# Patient Record
Sex: Female | Born: 1980 | Race: White | Hispanic: No | Smoking: Never smoker
Health system: Southern US, Community
[De-identification: ages and names within clinical notes are randomized; demographics above are authoritative.]

## PROBLEM LIST (undated history)

## (undated) DIAGNOSIS — E118 Type 2 diabetes mellitus with unspecified complications: Secondary | ICD-10-CM

## (undated) DIAGNOSIS — H269 Unspecified cataract: Secondary | ICD-10-CM

## (undated) DIAGNOSIS — E113299 Type 2 diabetes mellitus with mild nonproliferative diabetic retinopathy without macular edema, unspecified eye: Secondary | ICD-10-CM

## (undated) DIAGNOSIS — F419 Anxiety disorder, unspecified: Secondary | ICD-10-CM

## (undated) DIAGNOSIS — I10 Essential (primary) hypertension: Secondary | ICD-10-CM

## (undated) HISTORY — PX: WISDOM TOOTH EXTRACTION: SHX21

## (undated) HISTORY — DX: Type 2 diabetes mellitus with mild nonproliferative diabetic retinopathy without macular edema, unspecified eye: E11.3299

## (undated) HISTORY — DX: Unspecified cataract: H26.9

## (undated) HISTORY — DX: Type 2 diabetes mellitus with unspecified complications: E11.8

## (undated) HISTORY — DX: Anxiety disorder, unspecified: F41.9

---

## 2006-02-12 ENCOUNTER — Inpatient Hospital Stay (HOSPITAL_COMMUNITY): Admission: EM | Admit: 2006-02-12 | Discharge: 2006-02-16 | Payer: Self-pay | Admitting: Family Medicine

## 2006-02-12 ENCOUNTER — Ambulatory Visit: Payer: Self-pay | Admitting: Internal Medicine

## 2006-02-23 ENCOUNTER — Ambulatory Visit: Payer: Self-pay | Admitting: Internal Medicine

## 2006-02-24 ENCOUNTER — Inpatient Hospital Stay (HOSPITAL_COMMUNITY): Admission: AD | Admit: 2006-02-24 | Discharge: 2006-02-27 | Payer: Self-pay | Admitting: Internal Medicine

## 2006-03-09 ENCOUNTER — Ambulatory Visit: Payer: Self-pay | Admitting: Internal Medicine

## 2006-03-26 ENCOUNTER — Ambulatory Visit: Payer: Self-pay | Admitting: Internal Medicine

## 2006-04-19 ENCOUNTER — Ambulatory Visit (HOSPITAL_COMMUNITY): Admission: RE | Admit: 2006-04-19 | Discharge: 2006-04-19 | Payer: Self-pay | Admitting: Internal Medicine

## 2006-06-30 ENCOUNTER — Encounter (INDEPENDENT_AMBULATORY_CARE_PROVIDER_SITE_OTHER): Payer: Self-pay | Admitting: Hospitalist

## 2006-06-30 ENCOUNTER — Ambulatory Visit: Payer: Self-pay | Admitting: Internal Medicine

## 2006-09-27 ENCOUNTER — Ambulatory Visit: Payer: Self-pay | Admitting: Internal Medicine

## 2007-02-01 ENCOUNTER — Encounter: Payer: Self-pay | Admitting: Internal Medicine

## 2007-02-01 ENCOUNTER — Ambulatory Visit: Payer: Self-pay | Admitting: Internal Medicine

## 2007-02-01 DIAGNOSIS — E119 Type 2 diabetes mellitus without complications: Secondary | ICD-10-CM

## 2007-02-01 DIAGNOSIS — J309 Allergic rhinitis, unspecified: Secondary | ICD-10-CM | POA: Insufficient documentation

## 2007-02-01 DIAGNOSIS — F909 Attention-deficit hyperactivity disorder, unspecified type: Secondary | ICD-10-CM | POA: Insufficient documentation

## 2007-02-01 LAB — CONVERTED CEMR LAB: Blood Glucose, Fingerstick: 156

## 2007-11-24 ENCOUNTER — Ambulatory Visit: Payer: Self-pay | Admitting: Internal Medicine

## 2007-11-24 DIAGNOSIS — I498 Other specified cardiac arrhythmias: Secondary | ICD-10-CM | POA: Insufficient documentation

## 2007-11-24 DIAGNOSIS — J069 Acute upper respiratory infection, unspecified: Secondary | ICD-10-CM | POA: Insufficient documentation

## 2007-11-24 HISTORY — DX: Other specified cardiac arrhythmias: I49.8

## 2007-11-24 LAB — CONVERTED CEMR LAB
ALT: 67 units/L — ABNORMAL HIGH (ref 0–35)
BUN: 15 mg/dL (ref 6–23)
Basophils Relative: 0 % (ref 0–1)
Chloride: 96 meq/L (ref 96–112)
Creatinine, Ser: 0.82 mg/dL (ref 0.40–1.20)
Eosinophils Absolute: 0.1 10*3/uL (ref 0.0–0.7)
Glucose, Bld: 332 mg/dL — ABNORMAL HIGH (ref 70–99)
HCT: 46.3 % — ABNORMAL HIGH (ref 36.0–46.0)
Hgb A1c MFr Bld: 9.3 %
Lymphs Abs: 2.1 10*3/uL (ref 0.7–4.0)
MCHC: 36.1 g/dL — ABNORMAL HIGH (ref 30.0–36.0)
MCV: 87.5 fL (ref 78.0–100.0)
Potassium: 4.1 meq/L (ref 3.5–5.3)
RDW: 12 % (ref 11.5–15.5)
TSH: 0.576 microintl units/mL (ref 0.350–5.50)

## 2007-12-08 ENCOUNTER — Ambulatory Visit: Payer: Self-pay | Admitting: Internal Medicine

## 2007-12-08 ENCOUNTER — Encounter: Payer: Self-pay | Admitting: Internal Medicine

## 2007-12-09 LAB — CONVERTED CEMR LAB
Cholesterol: 158 mg/dL (ref 0–200)
HDL: 54 mg/dL (ref >39)
Total CHOL/HDL Ratio: 2.9
Triglycerides: 57 mg/dL (ref <150)
VLDL: 11 mg/dL (ref 0–40)

## 2007-12-15 ENCOUNTER — Telehealth (INDEPENDENT_AMBULATORY_CARE_PROVIDER_SITE_OTHER): Payer: Self-pay | Admitting: *Deleted

## 2007-12-15 ENCOUNTER — Ambulatory Visit: Payer: Self-pay | Admitting: Internal Medicine

## 2007-12-15 LAB — CONVERTED CEMR LAB
Blood Glucose, Fingerstick: 255
Blood Glucose, Home Monitor: 3 mg/dL

## 2008-01-10 ENCOUNTER — Ambulatory Visit: Payer: Self-pay | Admitting: Internal Medicine

## 2008-01-10 LAB — CONVERTED CEMR LAB: Blood Glucose, Fingerstick: 151

## 2008-01-11 ENCOUNTER — Encounter (INDEPENDENT_AMBULATORY_CARE_PROVIDER_SITE_OTHER): Payer: Self-pay | Admitting: Internal Medicine

## 2008-01-25 ENCOUNTER — Encounter (INDEPENDENT_AMBULATORY_CARE_PROVIDER_SITE_OTHER): Payer: Self-pay | Admitting: Internal Medicine

## 2008-02-29 ENCOUNTER — Telehealth: Payer: Self-pay | Admitting: *Deleted

## 2008-02-29 ENCOUNTER — Ambulatory Visit (HOSPITAL_COMMUNITY): Admission: RE | Admit: 2008-02-29 | Discharge: 2008-02-29 | Payer: Self-pay | Admitting: Infectious Disease

## 2008-02-29 ENCOUNTER — Ambulatory Visit: Payer: Self-pay | Admitting: Infectious Disease

## 2008-02-29 DIAGNOSIS — M79609 Pain in unspecified limb: Secondary | ICD-10-CM

## 2008-03-30 ENCOUNTER — Ambulatory Visit: Payer: Self-pay | Admitting: Internal Medicine

## 2008-03-30 ENCOUNTER — Encounter (INDEPENDENT_AMBULATORY_CARE_PROVIDER_SITE_OTHER): Payer: Self-pay | Admitting: *Deleted

## 2008-03-30 LAB — CONVERTED CEMR LAB: Blood Glucose, Fingerstick: 171

## 2008-08-02 ENCOUNTER — Ambulatory Visit: Payer: Self-pay | Admitting: Infectious Disease

## 2009-04-16 ENCOUNTER — Telehealth (INDEPENDENT_AMBULATORY_CARE_PROVIDER_SITE_OTHER): Payer: Self-pay | Admitting: Internal Medicine

## 2009-08-27 ENCOUNTER — Telehealth (INDEPENDENT_AMBULATORY_CARE_PROVIDER_SITE_OTHER): Payer: Self-pay | Admitting: *Deleted

## 2010-01-31 ENCOUNTER — Telehealth: Payer: Self-pay | Admitting: *Deleted

## 2010-02-24 ENCOUNTER — Encounter (INDEPENDENT_AMBULATORY_CARE_PROVIDER_SITE_OTHER): Payer: Self-pay | Admitting: Internal Medicine

## 2010-03-09 ENCOUNTER — Encounter: Payer: Self-pay | Admitting: Internal Medicine

## 2010-03-25 ENCOUNTER — Ambulatory Visit: Payer: Self-pay | Admitting: Internal Medicine

## 2010-03-25 DIAGNOSIS — I10 Essential (primary) hypertension: Secondary | ICD-10-CM

## 2010-03-31 ENCOUNTER — Ambulatory Visit: Payer: Self-pay | Admitting: Internal Medicine

## 2010-04-01 LAB — CONVERTED CEMR LAB
BUN: 10 mg/dL (ref 6–23)
CO2: 24 meq/L (ref 19–32)
Calcium: 9.4 mg/dL (ref 8.4–10.5)
Chloride: 98 meq/L (ref 96–112)
Creatinine, Ser: 0.79 mg/dL (ref 0.40–1.20)
Glucose, Bld: 224 mg/dL — ABNORMAL HIGH (ref 70–99)
HDL: 62 mg/dL (ref >39)
MCV: 91 fL (ref >=78.0)
Platelets: 223 10*3/uL (ref 150–400)
Potassium: 3.8 meq/L (ref 3.5–5.3)
RBC: 4.78 M/uL (ref 3.87–5.11)
RDW: 13 % (ref 11.5–15.5)
Total Bilirubin: 0.6 mg/dL (ref 0.3–1.2)
Total Protein: 6.5 g/dL (ref 6.0–8.3)
WBC: 5 10*3/uL (ref 4.0–10.5)

## 2010-04-07 ENCOUNTER — Ambulatory Visit: Payer: Self-pay | Admitting: Internal Medicine

## 2010-04-07 DIAGNOSIS — R740 Nonspecific elevation of levels of transaminase and lactic acid dehydrogenase [LDH]: Secondary | ICD-10-CM

## 2010-04-08 ENCOUNTER — Encounter (INDEPENDENT_AMBULATORY_CARE_PROVIDER_SITE_OTHER): Payer: Self-pay | Admitting: Internal Medicine

## 2010-04-08 LAB — CONVERTED CEMR LAB
HCV Ab: NEGATIVE
Hep B S Ab: NEGATIVE
Hepatitis B Surface Ag: NEGATIVE

## 2010-05-06 ENCOUNTER — Ambulatory Visit (HOSPITAL_COMMUNITY): Admission: RE | Admit: 2010-05-06 | Discharge: 2010-05-06 | Payer: Self-pay | Admitting: Internal Medicine

## 2010-05-06 ENCOUNTER — Encounter: Payer: Self-pay | Admitting: Internal Medicine

## 2010-05-06 ENCOUNTER — Ambulatory Visit: Payer: Self-pay | Admitting: Internal Medicine

## 2010-05-07 ENCOUNTER — Encounter: Payer: Self-pay | Admitting: Internal Medicine

## 2010-05-08 ENCOUNTER — Ambulatory Visit (HOSPITAL_COMMUNITY): Admission: RE | Admit: 2010-05-08 | Discharge: 2010-05-08 | Payer: Self-pay | Admitting: Internal Medicine

## 2010-05-12 ENCOUNTER — Encounter: Payer: Self-pay | Admitting: Internal Medicine

## 2010-05-19 LAB — CONVERTED CEMR LAB
AST: 97 units/L — ABNORMAL HIGH (ref 0–37)
Albumin: 4.4 g/dL (ref 3.5–5.2)
Alkaline Phosphatase: 116 units/L (ref 39–117)
BUN: 23 mg/dL (ref 6–23)
CO2: 29 meq/L (ref 19–32)
Calcium: 9.7 mg/dL (ref 8.4–10.5)
Creatinine, Ser: 0.9 mg/dL (ref 0.40–1.20)
Glucose, Bld: 119 mg/dL — ABNORMAL HIGH (ref 70–99)
Total Bilirubin: 0.4 mg/dL (ref 0.3–1.2)
Total Protein: 6.7 g/dL (ref 6.0–8.3)

## 2010-05-21 ENCOUNTER — Telehealth (INDEPENDENT_AMBULATORY_CARE_PROVIDER_SITE_OTHER): Payer: Self-pay | Admitting: *Deleted

## 2010-06-05 ENCOUNTER — Ambulatory Visit: Payer: Self-pay | Admitting: Internal Medicine

## 2010-08-05 ENCOUNTER — Ambulatory Visit: Payer: Self-pay | Admitting: Internal Medicine

## 2010-08-05 LAB — CONVERTED CEMR LAB
Blood Glucose, Fingerstick: 160
Hgb A1c MFr Bld: 5.9 %

## 2010-08-06 ENCOUNTER — Encounter: Payer: Self-pay | Admitting: Internal Medicine

## 2010-08-08 LAB — CONVERTED CEMR LAB
ALT: 52 units/L — ABNORMAL HIGH (ref 0–35)
Transferrin: 299 mg/dL (ref 212–360)

## 2010-08-19 ENCOUNTER — Encounter (INDEPENDENT_AMBULATORY_CARE_PROVIDER_SITE_OTHER): Payer: Self-pay | Admitting: *Deleted

## 2010-09-04 ENCOUNTER — Telehealth (INDEPENDENT_AMBULATORY_CARE_PROVIDER_SITE_OTHER): Payer: Self-pay | Admitting: *Deleted

## 2010-11-03 IMAGING — US US ABDOMEN COMPLETE
1 series · 14 of 25 positions shown · non-contrast
Comparison: 04/19/2006

CLINICAL DATA: Abnormal liver function test

COMPLETE ABDOMINAL ULTRASOUND

[Series 1: us abdomen complete · 0.30mm/px · 14 of 71 slices shown]
[im 1/71]
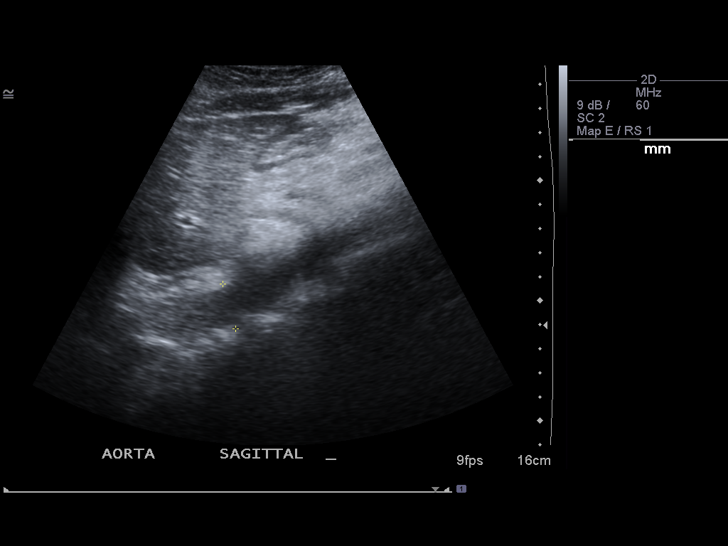
[im 6/71]
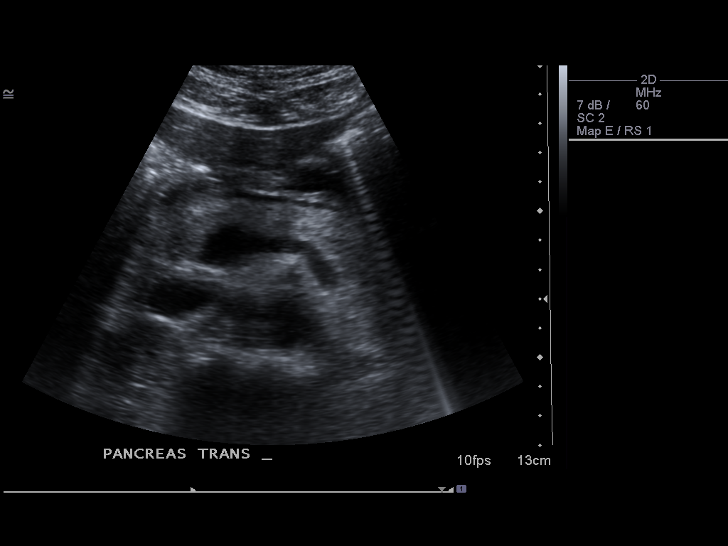
[im 12/71]
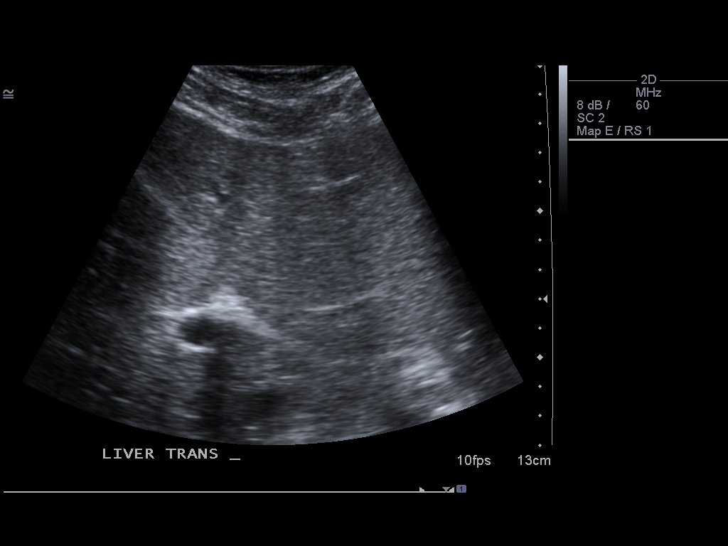
[im 18/71]
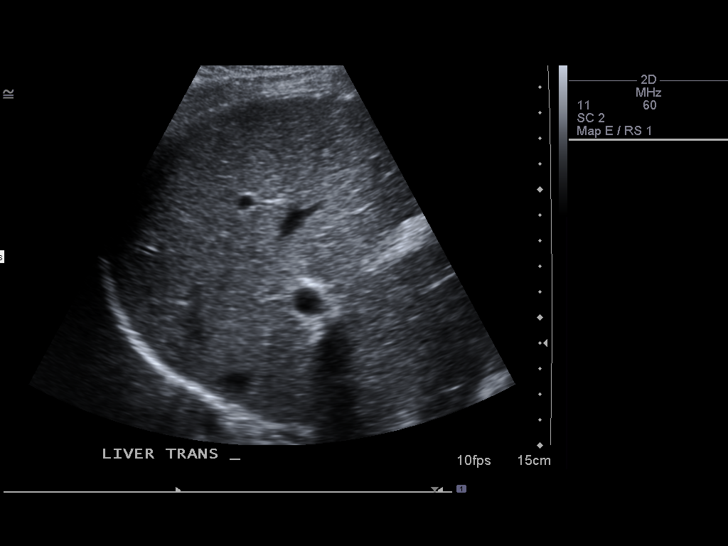
[im 24/71]
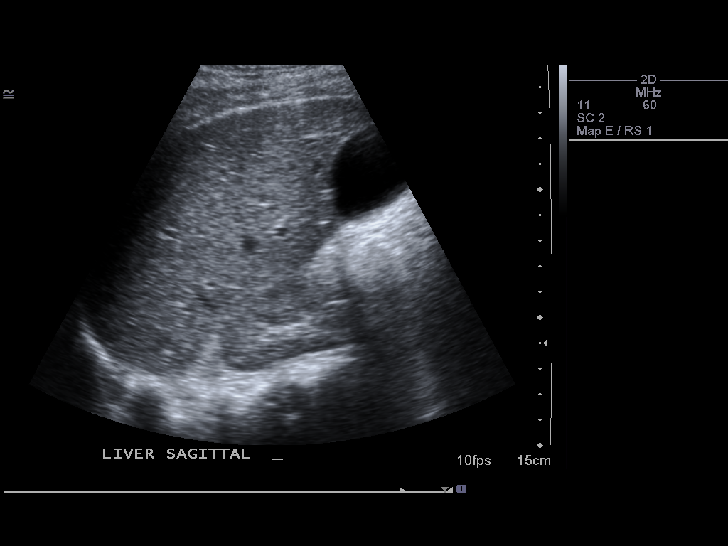
[im 27/71]
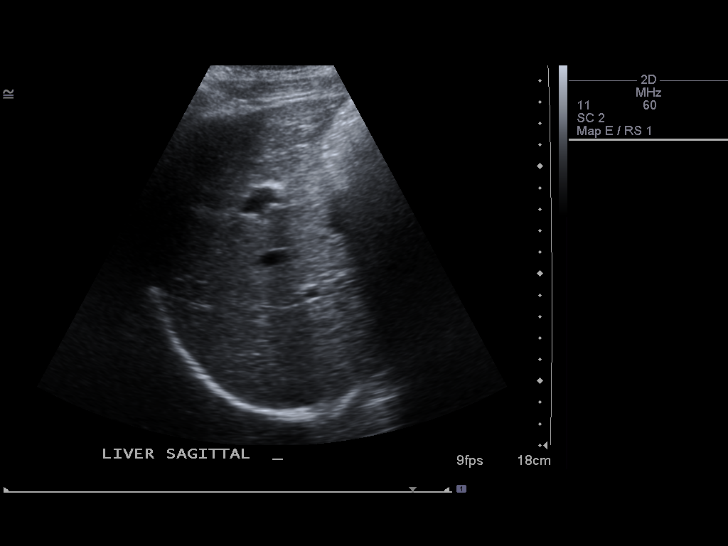
[im 33/71]
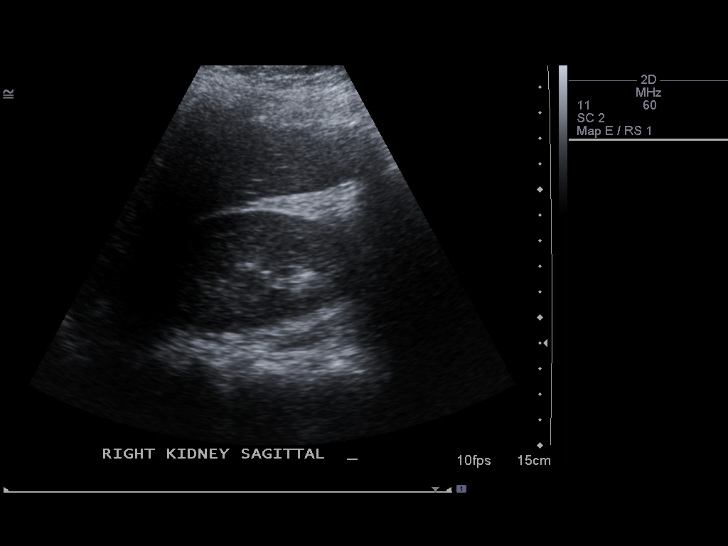
[im 38/71]
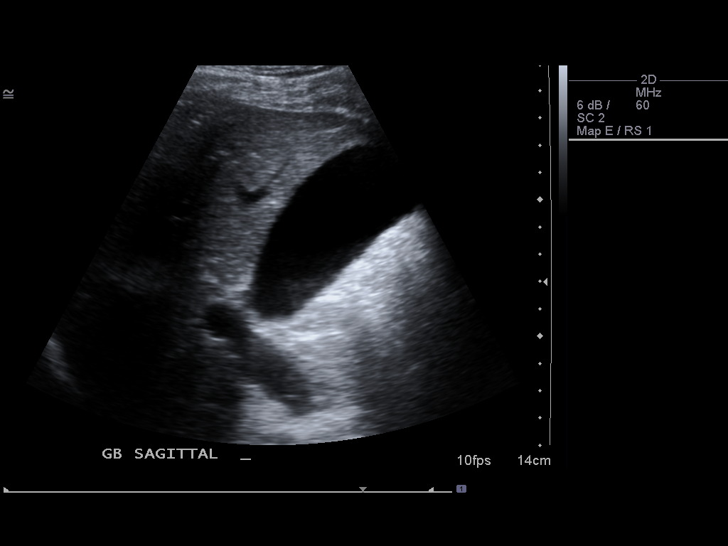
[im 44/71]
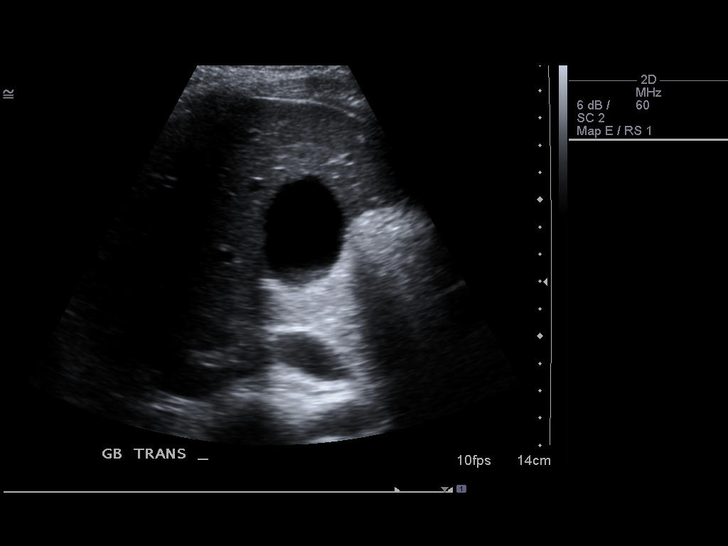
[im 47/71]
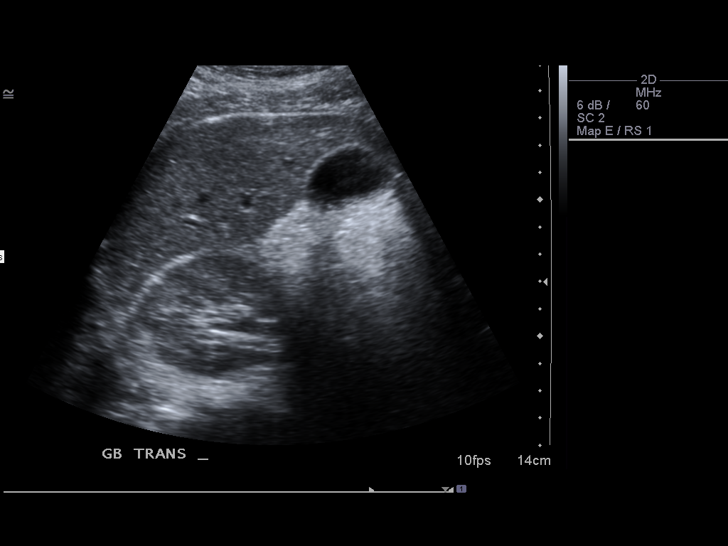
[im 53/71]
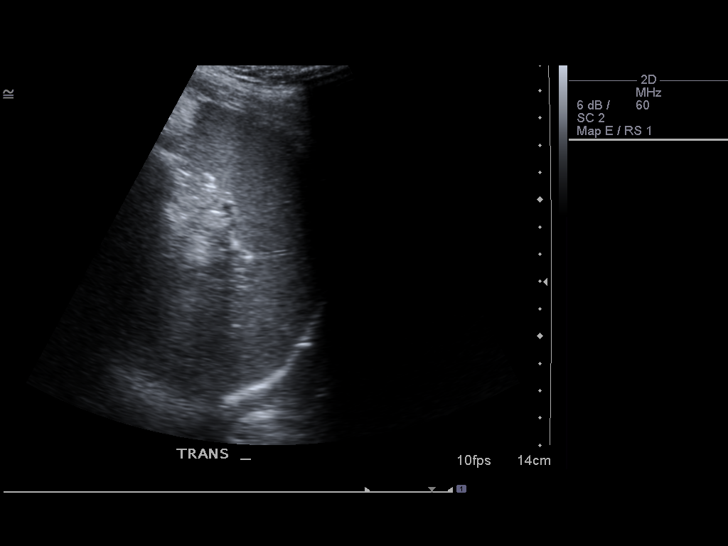
[im 59/71]
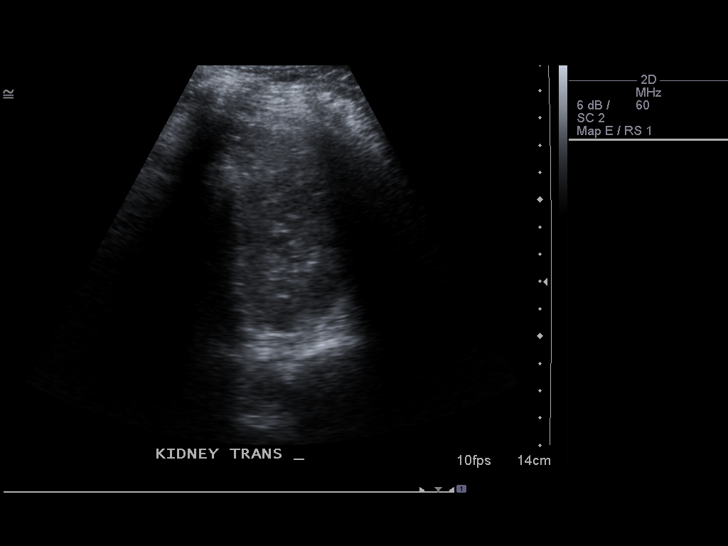
[im 65/71]
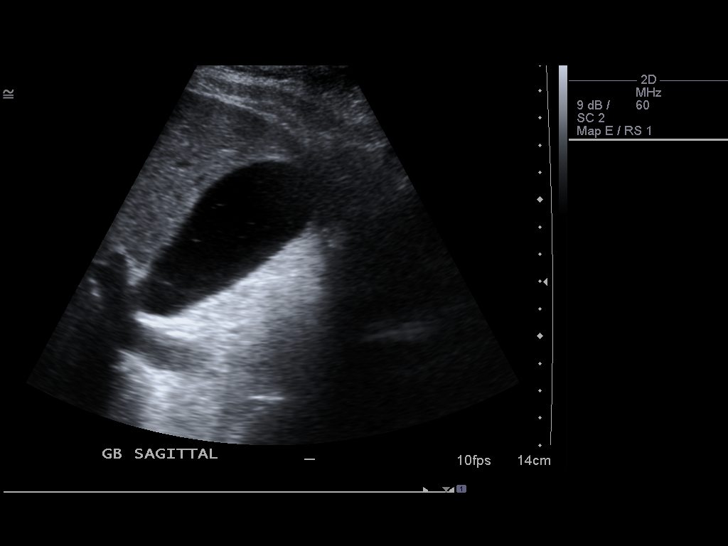
[im 71/71]
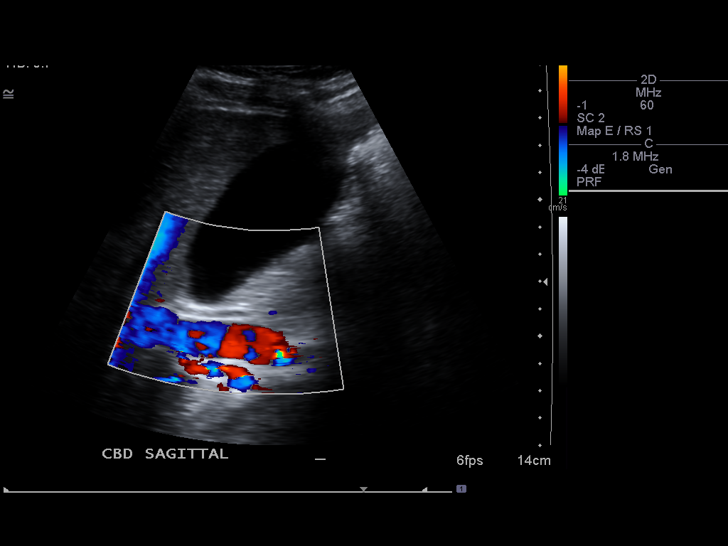

[14 of 25 positions shown; findings below may reference images not displayed]

FINDINGS: Gallbladder:  No gallstones, gallbladder wall thickening, or
pericholecystic fluid.

Common bile duct:  Measures 3 mm in diameter within normal limits

Liver:  No focal lesion identified.  Within normal limits in
parenchymal echogenicity.

IVC:  Appears normal.

Pancreas:  No focal abnormality seen.

Spleen:  Measures 7 cm in length. Normal echogenicity

Right Kidney:  Measures 9.4 cm in length.  No hydronephrosis or
diagnostic renal calculus

Left Kidney:  Measures 10.8 cm in length.  No hydronephrosis or
diagnostic renal calculus

Abdominal aorta:  No aneurysm identified. Measures up to 1.9 cm in
diameter.
IMPRESSION: Negative abdominal ultrasound.

## 2010-11-18 NOTE — Progress Notes (Signed)
Summary: med refill/gp  Phone Note Refill Request Message from:  Pharmacy on May 21, 2010 10:39 AM  Refills Requested: Medication #1:  METFORMIN HCL 1000 MG TABS take 1 pill by mouth two times a day. Last appt. July 19 w/labs.   Method Requested: Electronic Initial call taken by: Chinita Pester RN,  May 21, 2010 10:39 AM    Prescriptions: METFORMIN HCL 1000 MG TABS (METFORMIN HCL) take 1 pill by mouth two times a day.  #60 x 6   Entered and Authorized by:   Zoila Shutter MD   Signed by:   Zoila Shutter MD on 05/21/2010   Method used:   Electronically to        [facility] ([facility]ail)       [address]       [place], [place]  [address]       Ph: [phone]       Fax: [phone]   RxID:   [id number]

## 2010-11-18 NOTE — Consult Note (Signed)
Summary: [facility]  [facility]   Imported By: Louretta Parma 09/03/2010 14:59:40  _____________________________________________________________________  External Attachment:    Type:   Image     Comment:   External Document  Appended Document: [facility]   Diabetic Eye Exam  Procedure date:  08/19/2010  Findings:      No diabetic retinopathy.     Procedures Next Due Date:    Diabetic Eye Exam: 08/2011   Diabetic Eye Exam  Procedure date:  08/19/2010  Findings:      No diabetic retinopathy.     Procedures Next Due Date:    Diabetic Eye Exam: 08/2011

## 2010-11-18 NOTE — Assessment & Plan Note (Signed)
Summary: EST-1 MONTH F/U/CH   Vital Signs:  Patient profile:   30 year old female Height:      62 inches (157.48 cm) Weight:      173.9 pounds (79.05 kg) BMI:     31.92 Temp:     98.7 degrees F (37.06 degrees C) oral Pulse rate:   110 / minute BP sitting:   126 / 83  (right arm) Cuff size:   large  Vitals Entered By: Cynda Familia Duncan Dull) (June 05, 2010 4:08 PM)  CC: f/u diabetes, C-V Risk Management Is Patient Diabetic? Yes Did you bring your meter with you today? Yes Pain Assessment Patient in pain? no      Nutritional Status BMI of > 30 = obese  Have you ever been in a relationship where you felt threatened, hurt or afraid?No   Does patient need assistance? Functional Status Self care Ambulation Normal   Primary Care Provider:  Rosana Berger MD  CC:  f/u diabetes and C-V Risk Management.  History of Present Illness: 30 yo female with PMH of HTN, DM Type II, allergic rhinitis, ADHD, sinus tachycardia, elevated transaminase  presents today for her 1 month follow up. Patient admits of drinking 1 beer per day in the past month and has been drinking alcohol since she was 20 but not heavily. She had 2 low sugar episodes since she was seen in the office.  Most of the time it is in the 80s but it did go up to 200 once.  No other complaints, she said she is feeling great.    Cardiovascular Risk History:      Positive major cardiovascular risk factors include diabetes and hypertension.  Negative major cardiovascular risk factors include female age less than 24 years old and non-tobacco-user status.    Preventive Screening-Counseling & Management  Alcohol-Tobacco     Smoking Status: never     Passive Smoke Exposure: yes  Allergies: No Known Drug Allergies  Review of Systems  The patient denies anorexia, fever, weight loss, weight gain, vision loss, decreased hearing, hoarseness, chest pain, syncope, dyspnea on exertion, peripheral edema, prolonged cough, headaches,  hemoptysis, abdominal pain, melena, hematochezia, severe indigestion/heartburn, hematuria, incontinence, genital sores, muscle weakness, suspicious skin lesions, transient blindness, difficulty walking, depression, unusual weight change, abnormal bleeding, enlarged lymph nodes, angioedema, breast masses, and testicular masses.    Physical Exam  General:  alert, well-developed, well-nourished, and well-hydrated.   Lungs:  normal respiratory effort, no intercostal retractions, no accessory muscle use, normal breath sounds, no dullness, no crackles, and no wheezes.   Heart:  normal rate, regular rhythm, no murmur, no gallop, no rub, and no JVD.   Abdomen:  soft, non-tender, normal bowel sounds, no distention, no masses, and no guarding.   Extremities:  no edema Neurologic:  alert & oriented X3 and cranial nerves II-XII intact.     Impression & Recommendations:  Problem # 1:  TRANSAMINASES, SERUM, ELEVATED (ICD-790.4) LFTs panel on 05/07/10: AST 97 and ALT 130.  She also has a negative abdominal ultrasound.  I discussed with her that the elevated transaminase could be due to her alcohol intake in the past 9 years.  I also discussed in details the complications of fatty liver and cirrhosis if she continues to drink alchol.  She does not have any other risk factors such as high risk sexual behavior or IVDU.  Her hepatitis panel in june 2011 was negative.  Other differential diagnosis include medications.  She is currently on both  ACEi and Glipizide.  I will stop her Glipizide 10mg  today and start her on an equivalent dose of Glyburide 5mg  two times a day and recheck her LFTs and HbA1c  in 2-3 months.  Problem # 2:  HYPERTENSION (ICD-401.9) Well controlled- will continue her current med for now.  If her transaminases continue to be elevated, she may need to be taken off of the ACEi.    Her updated medication list for this problem includes:    Lisinopril 5 Mg Tabs (Lisinopril) .Marland Kitchen... Take 1 pill by mouth  daily.  Problem # 3:  DIABETES MELLITUS, TYPE II (ICD-250.00) BS- well controlled from her home meter.  She normally runs in 90s-100s.   CBG today: 100 HbA1c: 10.8 (03/2010) Will stop Glipizide 10mg  because of her elevated transaminases.   Start Glyburide 5mg  by mouth two times a day  Continue Metformin 1000 mg bid and Lisinopril 5mg  once daily  She does have an opthalmology appointment on 08/19/2010. WIll recheck HbA1c in 2-3 months, I will do a formal foot exam/monofilament next office visit  The following medications were removed from the medication list:    Glucotrol 10 Mg Tabs (Glipizide) .Marland Kitchen... Take 1 pill by mouth two times a day. Her updated medication list for this problem includes:    Metformin Hcl 1000 Mg Tabs (Metformin hcl) .Marland Kitchen... Take 1 pill by mouth two times a day.    Lisinopril 5 Mg Tabs (Lisinopril) .Marland Kitchen... Take 1 pill by mouth daily.    Glyburide 5 Mg Tabs (Glyburide) .Marland Kitchen... 1 tablet by mouth twice daily  Orders: Ophthalmology Referral (Ophthalmology)  Problem # 4:  SINUS TACHYCARDIA (ICD-427.89) Patient has a history of sinus tachycardia ranging from 105-121.  I checked her TSH level on 05/07/10 and it was WNL.  She reports that she feels anxious whenever she comes into the doctor's office because she worries that the doctor will tell her something bad in regards to her health and that she does not have palpitation at home.  I will continue to monitor her pulse because this could be due to white coat syndrome; however, if her tachycardia continues to persist, it can lead to tachycardic cardiomyopathy, I will ask her to check her pulse at home and she might benefit from a beta blocker to prevent cardiomyopathy.    Complete Medication List: 1)  Metformin Hcl 1000 Mg Tabs (Metformin hcl) .... Take 1 pill by mouth two times a day. 2)  Lisinopril 5 Mg Tabs (Lisinopril) .... Take 1 pill by mouth daily. 3)  Glyburide 5 Mg Tabs (Glyburide) .Marland Kitchen.. 1 tablet by mouth twice  daily  Cardiovascular Risk Assessment/Plan:      The patient's hypertensive risk group is category C: Target organ damage and/or diabetes.  Her calculated 10 year risk of coronary heart disease is 1 %.  Today's blood pressure is 126/83.    Patient Instructions: 1)  Stop Glipizide 2)  Start Glyburide 5mg  one tablet by mouth twice daily 3)  Stop alcohol 4)  Follow up with Dr. Anselm Jungling in 2-3 months to recheck bloodwork 5)  Continue Lisinopril for blood pressure 6)  Will refer to opthalmology Prescriptions: GLYBURIDE 5 MG TABS (GLYBURIDE) 1 tablet by mouth twice daily  #60 x 6   Entered and Authorized by:   Rosana Berger MD   Signed by:   Rosana Berger MD on 06/05/2010   Method used:   Electronically to        [facility])  [address]       [place], [place]  [address]       Ph: [phone]       Fax: [phone]   RxID:   [id number]    Prevention & Chronic Care Immunizations   Influenza vaccine: Historical  (09/27/2006)   Influenza vaccine deferral: Deferred  (03/25/2010)    Tetanus booster: Not documented   Td booster deferral: Deferred  (03/25/2010)    Pneumococcal vaccine: Not documented   Pneumococcal vaccine deferral: Deferred  (03/25/2010)  Other Screening   Pap smear: Not documented   Pap smear action/deferral: Deferred  (03/25/2010)   Smoking status: never  (06/05/2010)    Screening comments: Patient reports still a virgin  Diabetes Mellitus   HgbA1C: 10.8  (03/31/2010)   HgbA1C action/deferral: Ordered  (03/25/2010)   Hemoglobin A1C due: 09/05/2010    Eye exam: Mild non-proliferative diabetic retinopathy.   Visual acuity OD (best corrected):     20/30 Visual acuity OS (best corrected):     20/30 Intraocular pressure OD:     16 Intraocular pressure OS:     15   (01/25/2008)   Diabetic eye exam action/deferral: Ophthalmology referral  (06/05/2010)   Eye exam due: 08/19/2010    Foot exam: Not documented    Foot exam action/deferral: Deferred   High risk foot: Not documented   Foot care education: Not documented   Foot exam due: 09/05/2010    Urine microalbumin/creatinine ratio: 17.4  (03/31/2010)   Urine microalbumin action/deferral: Ordered    Diabetes flowsheet reviewed?: Yes   Progress toward A1C goal: Unchanged  Hypertension   Last Blood Pressure: 126 / 83  (06/05/2010)   Serum creatinine: 0.90  (05/07/2010)   Serum potassium 4.0  (05/07/2010)    Hypertension flowsheet reviewed?: Yes   Progress toward BP goal: At goal  Self-Management Support :   Personal Goals (by the next clinic visit) :     Personal A1C goal: 6  (03/25/2010)     Personal blood pressure goal: 130/80  (03/25/2010)     Personal LDL goal: 100  (03/25/2010)    Patient will work on the following items until the next clinic visit to reach self-care goals:     Medications and monitoring: take my medicines every day  (06/05/2010)     Eating: eat foods that are low in salt, eat baked foods instead of fried foods  (06/05/2010)     Activity: join a walking program  (06/05/2010)    Diabetes self-management support: Resources for patients handout, Written self-care plan  (06/05/2010)   Diabetes care plan printed   Last diabetes self-management training by diabetes educator: 03/30/2008    Hypertension self-management support: Resources for patients handout, Written self-care plan  (06/05/2010)   Hypertension self-care plan printed.      Resource handout printed.   Nursing Instructions: has eye appt with dr Dione Booze for eye exam   Appended Document: EST-1 MONTH F/U/CH Ms. Fortin's history and physical were reviewed with Dr. Anselm Jungling and we formulated the assessment and plan together.  I agree with the above documentation.  Will see if transaminitis improves with the D/C of the glipizide.  Interestingly, evaluation to date with a hepatitis panel and imaging has been unremarkable.

## 2010-11-18 NOTE — Assessment & Plan Note (Signed)
Summary: ACUTE-F/U ON MEDS/CFB   Vital Signs:  Patient profile:   30 year old female Height:      62 inches (157.48 cm) Weight:      168.9 pounds (76.77 kg) BMI:     31.00 Temp:     98.6 degrees F (37.00 degrees C) oral Pulse rate:   112 / minute BP sitting:   148 / 96  (right arm)  Vitals Entered By: Stanton Kidney Ditzler RN (March 25, 2010 2:53 PM) Is Patient Diabetic? Yes Did you bring your meter with you today? Yes Pain Assessment Patient in pain? no      Nutritional Status BMI of > 30 = obese Nutritional Status Detail appetite good  Have you ever been in a relationship where you felt threatened, hurt or afraid?denies   Does patient need assistance? Functional Status Self care Ambulation Normal Comments Past month - CBG 300 - 400. This AM 300's.   Primary Care Provider:  Jason Coop MD   History of Present Illness: Kayla Erickson comes for following:  1. DM: SHe checks CBG 1-2 times a day and she ran anywhere from 290- 533 for about last 25 days. She takes metformin 850mg  two times a day only. SHe was diagnosed with DM 4 yrs ago when she was admitted to hospital with DKA. She had normal GAD, C peptide and negative anti-islet cell antibody. She saw an eye MD last summer.   2. High blood pressure: She has never been on any medicine for high blood pressure.   Depression History:      The patient denies a depressed mood most of the day and a diminished interest in her usual daily activities.         Preventive Screening-Counseling & Management  Alcohol-Tobacco     Smoking Status: never     Passive Smoke Exposure: yes  Caffeine-Diet-Exercise     Does Patient Exercise: no  Current Medications (verified): 1)  Metformin Hcl 1000 Mg Tabs (Metformin Hcl) .... Take 1 Pill By Mouth Two Times A Day. 2)  Glucotrol 5 Mg Tabs (Glipizide) .... Take 1 Pill By Mouth Two Times A Day. 3)  Lisinopril 5 Mg Tabs (Lisinopril) .... Take 1 Pill By Mouth Daily.  Allergies: No Known Drug  Allergies  Review of Systems      See HPI  Physical Exam  Mouth:  pharynx pink and moist.   Lungs:  normal breath sounds, no crackles, and no wheezes.   Heart:  normal rate, regular rhythm, no murmur, and no gallop.   Pulses:  R posterior tibial normal, R dorsalis pedis normal, L posterior tibial normal, and L dorsalis pedis normal.   Extremities:  trace left pedal edema and trace right pedal edema.   Neurologic:  alert & oriented X3.     Impression & Recommendations:  Problem # 1:  HYPERTENSION (ICD-401.9) When I repreated her BP myself it was 150/80. Her goal is <130/80 and she was higher than this on almost all office visits. I will start her on lisinopril and closely f/u.   Her updated medication list for this problem includes:    Lisinopril 5 Mg Tabs (Lisinopril) .Marland Kitchen... Take 1 pill by mouth daily.  BP today: 148/96 Prior BP: 136/87 (03/30/2008)  Labs Reviewed: K+: 4.1 (11/24/2007) Creat: : 0.82 (11/24/2007)   Chol: 158 (12/08/2007)   HDL: 54 (12/08/2007)   LDL: 93 (12/08/2007)   TG: 57 (12/08/2007)  Problem # 2:  DIABETES MELLITUS, TYPE II (ICD-250.00) WIll check  following labs when she comes fasting. Refer to optho. Will do foot exam next time. We discussed about DM for about 20 minutes. We covered natural course, ways to monitor, symptoms of hypoglycemia and what she should do then. We also discussed treatment options. I confirmed that she was type 2 diabetic, because she had normal GAD, C peptide and negative anti-islet cell antibody in hospital in 2007, although her initial d/c summary in 2007 says she is type 1. Then she was on insulin, but later on it was switched to pills. She had A1c near 7 on metformin last year and sher CBG are clearly very high. Her lowest was 290. I gave her an option of insulin vs pills, although I preferred insulin on her as I tend to think she may have some component of type 1 dm as she was diagnosed with diabetes at the age of 24. But, she preferred  to have pills first, and if needed insulin later. Therefore, I will increase her metformin to 1000 two times a day and add glipizide 5 mg two times a day. She will check CBG 4 times a day and f/u in 2 wks. Will do foot exam then.   Her updated medication list for this problem includes:    Metformin Hcl 1000 Mg Tabs (Metformin hcl) .Marland Kitchen... Take 1 pill by mouth two times a day.    Glucotrol 5 Mg Tabs (Glipizide) .Marland Kitchen... Take 1 pill by mouth two times a day.    Lisinopril 5 Mg Tabs (Lisinopril) .Marland Kitchen... Take 1 pill by mouth daily.  Orders: T-Comprehensive Metabolic Panel [id number]) T-Lipid Profile [id number]) T-Urine Microalbumin w/creat. ratio [id number]) Ophthalmology Referral (Ophthalmology) T-Hgb A1C (in-house) [phone])  Labs Reviewed: Creat: 0.82 (11/24/2007)     Last Eye Exam: Mild non-proliferative diabetic retinopathy.   Visual acuity OD (best corrected):     20/30 Visual acuity OS (best corrected):     20/30 Intraocular pressure OD:     16 Intraocular pressure OS:     15  (01/25/2008) Reviewed HgBA1c results: 7.3 (02/29/2008)  9.3 (11/24/2007)  Complete Medication List: 1)  Metformin Hcl 1000 Mg Tabs (Metformin hcl) .... Take 1 pill by mouth two times a day. 2)  Glucotrol 5 Mg Tabs (Glipizide) .... Take 1 pill by mouth two times a day. 3)  Lisinopril 5 Mg Tabs (Lisinopril) .... Take 1 pill by mouth daily.  Other Orders: T-CBC No Diff ([id number])  Patient Instructions: 1)  Please schedule a follow-up appointment in 2 weeks. 2)  Limit your Sodium (Salt) to less than 2 grams a day(slightly less than 1/2 a teaspoon) to prevent fluid retention, swelling, or worsening of symptoms. 3)  You need to lose weight. Consider a lower calorie diet and regular exercise.  4)  Check your Blood Pressure regularly. If it is above: you should make an appointment. Prescriptions: LISINOPRIL 5 MG TABS (LISINOPRIL) take 1 pill by mouth daily.  #30 x 0   Entered and Authorized by:    Jason Coop MD   Signed by:   Jason Coop MD on 03/25/2010   Method used:   Electronically to        [facility]       [address]       [place], [place]  [address]       Ph: [phone]       Fax: [phone]   RxID:   [id number] GLUCOTROL 5 MG TABS (GLIPIZIDE)  take 1 pill by mouth two times a day.  #60 x 0   Entered and Authorized by:   Jason Coop MD   Signed by:   Jason Coop MD on 03/25/2010   Method used:   Electronically to        [facility]       [address]       [place], [place]  [address]       Ph: [phone]       Fax: [phone]   RxID:   [id number] METFORMIN HCL 1000 MG TABS (METFORMIN HCL) take 1 pill by mouth two times a day.  #60 x 0   Entered and Authorized by:   Jason Coop MD   Signed by:   Jason Coop MD on 03/25/2010   Method used:   Electronically to        [facility]       [address]       [place], [place]  [address]       Ph: [phone]       Fax: [phone]   RxID:   [id number]  Process Orders Check Orders Results:     [facility]: ABN not required for this insurance Tests Sent for requisitioning (March 25, 2010 4:26 PM):     03/25/2010: [facility] -- T-Comprehensive Metabolic Panel [[id number]] (signed)     03/25/2010: [facility] -- T-Lipid Profile [phone] (signed)     03/25/2010: [facility] -- T-Urine Microalbumin w/creat. ratio [[id number]] (signed)     03/25/2010: [facility] -- T-CBC No Diff [[id number]] (signed)    Prevention & Chronic Care Immunizations   Influenza vaccine: Historical  (09/27/2006)   Influenza vaccine deferral: Deferred  (03/25/2010)    Tetanus booster: Not documented   Td booster deferral: Deferred  (03/25/2010)     Pneumococcal vaccine: Not documented   Pneumococcal vaccine deferral: Deferred  (03/25/2010)  Other Screening   Pap smear: Not documented   Pap smear action/deferral: Deferred  (03/25/2010)   Smoking status: never  (03/25/2010)  Diabetes Mellitus   HgbA1C: 7.3  (02/29/2008)   HgbA1C action/deferral: Ordered  (03/25/2010)    Eye exam: Mild non-proliferative diabetic retinopathy.   Visual acuity OD (best corrected):     20/30 Visual acuity OS (best corrected):     20/30 Intraocular pressure OD:     16 Intraocular pressure OS:     15   (01/25/2008)   Eye exam due: 01/2009    Foot exam: Not documented   Foot exam action/deferral: Do today   High risk foot: Not documented   Foot care education: Not documented    Urine microalbumin/creatinine ratio: Not documented   Urine microalbumin action/deferral: Ordered    Diabetes flowsheet reviewed?: Yes   Progress toward A1C goal: Unchanged  Hypertension   Last Blood Pressure: 148 / 96  (03/25/2010)   Serum creatinine: 0.82  (11/24/2007)   Serum potassium 4.1  (11/24/2007) CMP ordered   Self-Management Support :   Personal Goals (by the next clinic visit) :     Personal A1C goal: 6  (03/25/2010)     Personal blood pressure goal: 130/80  (03/25/2010)     Personal LDL goal: 100  (03/25/2010)    Patient will work on the following items until the next clinic visit to reach self-care goals:  Medications and monitoring: take my medicines every day, check my blood sugar, bring all of my medications to every visit, weigh myself weekly, examine my feet every day  (03/25/2010)     Eating: drink diet soda or water instead of juice or soda, eat more vegetables, use fresh or frozen vegetables, eat fruit for snacks and desserts  (03/25/2010)     Activity: take a 30 minute walk every day  (03/25/2010)    Diabetes self-management support: Copy of home glucose meter record, Written self-care plan, Education handout, Resources for patients  handout  (03/25/2010)   Diabetes care plan printed   Diabetes education handout printed   Last diabetes self-management training by diabetes educator: 03/30/2008    Hypertension self-management support: Written self-care plan, Resources for patients handout  (03/25/2010)   Hypertension self-care plan printed.      Resource handout printed.  Process Orders Check Orders Results:     [facility]: ABN not required for this insurance Tests Sent for requisitioning (March 25, 2010 4:26 PM):     03/25/2010: [facility] -- T-Comprehensive Metabolic Panel [[id number]] (signed)     03/25/2010: [facility] -- T-Lipid Profile [phone] (signed)     03/25/2010: [facility] -- T-Urine Microalbumin w/creat. ratio [[id number]] (signed)     03/25/2010: [facility] -- T-CBC No Diff [[id number]] (signed)

## 2010-11-18 NOTE — Assessment & Plan Note (Signed)
Summary: ACUTE/2 WEEK F/U VISIT/CH   Vital Signs:  Patient profile:   30 year old female Height:      62 inches (157.48 cm) Weight:      171.1 pounds (77.77 kg) BMI:     31.41 Temp:     99.4 degrees F (37.44 degrees C) oral Pulse rate:   107 / minute BP sitting:   127 / 87  (right arm)  Vitals Entered By: Stanton Kidney Ditzler RN (April 07, 2010 3:12 PM) Is Patient Diabetic? Yes Did you bring your meter with you today? Yes Pain Assessment Patient in pain? no      Nutritional Status BMI of > 30 = obese Nutritional Status Detail appetite good  Have you ever been in a relationship where you felt threatened, hurt or afraid?denies   Does patient need assistance? Functional Status Self care Ambulation Normal Comments CBG done this AM - 230. FU - ck meds.   Primary Care Provider:  Jason Coop MD   History of Present Illness: Kayla Erickson comes for f/u visit.   1. DM: She is checking her CBG 4 times a day and she ran from 129 to 356 and she averaged about 230's. This is with increased dose of metformin and glipizide 5 mg two times a day. She is checking her diet more stricter and is exercising more often.   2. HTN: She started to take lisinopril and she had no problem with the medicine.   3. Transaminitis: No stomach pain.   Depression History:      The patient denies a depressed mood most of the day and a diminished interest in her usual daily activities.         Preventive Screening-Counseling & Management  Alcohol-Tobacco     Smoking Status: never     Passive Smoke Exposure: yes  Caffeine-Diet-Exercise     Does Patient Exercise: no  Current Medications (verified): 1)  Metformin Hcl 1000 Mg Tabs (Metformin Hcl) .... Take 1 Pill By Mouth Two Times A Day. 2)  Glucotrol 10 Mg Tabs (Glipizide) .... Take 1 Pill By Mouth Two Times A Day. 3)  Lisinopril 5 Mg Tabs (Lisinopril) .... Take 1 Pill By Mouth Daily.  Allergies: No Known Drug Allergies  Review of Systems  See HPI  Physical Exam  Lungs:  normal breath sounds, no crackles, and no wheezes.   Heart:  normal rate, regular rhythm, and no murmur.   Pulses:  R posterior tibial normal, R dorsalis pedis normal, L posterior tibial normal, and L dorsalis pedis normal.   Extremities:  trace left pedal edema and trace right pedal edema.   Neurologic:  alert & oriented X3.     Impression & Recommendations:  Problem # 1:  TRANSAMINASES, SERUM, ELEVATED (ICD-790.4) WIll check following first and if they are negative, will get RUQ USG. Encouraged to loose wt.  Orders: T-Hepatitis B Surface Antibody ([id number]) T-Hepatitis B Surface Antigen [phone]) T-Hepatitis C Antibody ([id number]) T-Hepatitis A Antibody ([id number]) T-Hepatitis A Antibody, IGM ([id number])  Problem # 2:  HYPERTENSION (ICD-401.9) Will cont same for now and closely f/u.  Her updated medication list for this problem includes:    Lisinopril 5 Mg Tabs (Lisinopril) .Marland Kitchen... Take 1 pill by mouth daily.  BP today: 127/87 Prior BP: 148/96 (03/25/2010)  Labs Reviewed: K+: 3.8 (03/31/2010) Creat: : 0.79 (03/31/2010)   Chol: 144 (03/31/2010)   HDL: 62 (03/31/2010)   LDL: 65 (03/31/2010)   TG: 83 (03/31/2010)  Problem # 3:  DIABETES MELLITUS,  TYPE II (ICD-250.00) See HPI. Average CBGs in 230. Will increase glipizide from 5 to 10 two times a day. Will f/u in a month. Foot exam done today and we will try to get her optho appt. She understands the importance of diet and exercise and is following that. I encouraged her to continue it.  Her updated medication list for this problem includes:    Metformin Hcl 1000 Mg Tabs (Metformin hcl) .Marland Kitchen... Take 1 pill by mouth two times a day.    Glucotrol 10 Mg Tabs (Glipizide) .Marland Kitchen... Take 1 pill by mouth two times a day.    Lisinopril 5 Mg Tabs (Lisinopril) .Marland Kitchen... Take 1 pill by mouth daily.  Labs Reviewed: Creat: 0.79 (03/31/2010)     Last Eye Exam: Mild non-proliferative diabetic retinopathy.     Visual acuity OD (best corrected):     20/30 Visual acuity OS (best corrected):     20/30 Intraocular pressure OD:     16 Intraocular pressure OS:     15  (01/25/2008) Reviewed HgBA1c results: 10.8 (03/31/2010)  7.3 (02/29/2008)  Complete Medication List: 1)  Metformin Hcl 1000 Mg Tabs (Metformin hcl) .... Take 1 pill by mouth two times a day. 2)  Glucotrol 10 Mg Tabs (Glipizide) .... Take 1 pill by mouth two times a day. 3)  Lisinopril 5 Mg Tabs (Lisinopril) .... Take 1 pill by mouth daily.  Patient Instructions: 1)  Please schedule a follow-up appointment in 1 month. 2)  Limit your Sodium (Salt) to less than 2 grams a day(slightly less than 1/2 a teaspoon) to prevent fluid retention, swelling, or worsening of symptoms. 3)  It is important that you exercise regularly at least 20 minutes 5 times a week. If you develop chest pain, have severe difficulty breathing, or feel very tired , stop exercising immediately and seek medical attention. 4)  You need to lose weight. Consider a lower calorie diet and regular exercise.  5)  Check your blood sugars regularly. If your readings are usually above : or below 70 you should contact our office. 6)  It is important that your Diabetic A1c level is checked every 3 months. 7)  See your eye doctor yearly to check for diabetic eye damage. 8)  Check your feet each night for sore areas, calluses or signs of infection. 9)  Check your Blood Pressure regularly. If it is above: you should make an appointment. Prescriptions: LISINOPRIL 5 MG TABS (LISINOPRIL) take 1 pill by mouth daily.  #30 x 0   Entered and Authorized by:   Jason Coop MD   Signed by:   Jason Coop MD on 04/07/2010   Method used:   Electronically to        [facility]       [address]       [place], [place]  [address]       Ph: [phone]       Fax: [phone]   RxID:   [id number] METFORMIN HCL 1000 MG TABS  (METFORMIN HCL) take 1 pill by mouth two times a day.  #60 x 0   Entered and Authorized by:   Jason Coop MD   Signed by:   Jason Coop MD on 04/07/2010   Method used:   Electronically to        [facility]       [phone]       [place]  [place], [place]  [address]       Ph: [phone]       Fax: [phone]   RxID:   [id number] GLUCOTROL 10 MG TABS (GLIPIZIDE) take 1 pill by mouth two times a day.  #30 x 0   Entered and Authorized by:   Jason Coop MD   Signed by:   Jason Coop MD on 04/07/2010   Method used:   Electronically to        [facility]       [address]       [place], [place]  [address]       Ph: [phone]       Fax: [phone]   RxID:   [id number]  Process Orders Check Orders Results:     [facility]: ABN not required for this insurance Tests Sent for requisitioning (April 07, 2010 4:37 PM):     04/07/2010: [facility] -- T-Hepatitis B Surface Antibody [[id number]] (signed)     04/07/2010: [facility] -- T-Hepatitis B Surface Antigen [[id number]] (signed)     04/07/2010: [facility] -- T-Hepatitis C Antibody [[id number]] (signed)     04/07/2010: [facility] -- T-Hepatitis A Antibody [[id number]] (signed)     04/07/2010: [facility] -- T-Hepatitis A Antibody, IGM [[id number]] (signed)    Prevention & Chronic Care Immunizations   Influenza vaccine: Historical  (09/27/2006)   Influenza vaccine deferral: Deferred  (03/25/2010)    Tetanus booster: Not documented   Td booster deferral: Deferred  (03/25/2010)    Pneumococcal vaccine: Not documented   Pneumococcal vaccine deferral: Deferred  (03/25/2010)  Other Screening   Pap smear: Not documented   Pap smear action/deferral: Deferred  (03/25/2010)   Smoking status: never   (04/07/2010)  Diabetes Mellitus   HgbA1C: 10.8  (03/31/2010)   HgbA1C action/deferral: Ordered  (03/25/2010)    Eye exam: Mild non-proliferative diabetic retinopathy.   Visual acuity OD (best corrected):     20/30 Visual acuity OS (best corrected):     20/30 Intraocular pressure OD:     16 Intraocular pressure OS:     15   (01/25/2008)   Eye exam due: 01/2009    Foot exam: Not documented   Foot exam action/deferral: Do today   High risk foot: Not documented   Foot care education: Not documented    Urine microalbumin/creatinine ratio: 17.4  (03/31/2010)   Urine microalbumin action/deferral: Ordered  Hypertension   Last Blood Pressure: 127 / 87  (04/07/2010)   Serum creatinine: 0.79  (03/31/2010)   Serum potassium 3.8  (03/31/2010)  Self-Management Support :   Personal Goals (by the next clinic visit) :     Personal A1C goal: 6  (03/25/2010)     Personal blood pressure goal: 130/80  (03/25/2010)     Personal LDL goal: 100  (03/25/2010)    Patient will work on the following items until the next clinic visit to reach self-care goals:     Medications and monitoring: take my medicines every day, check my blood sugar, check my blood pressure, bring all of my medications to every visit, weigh myself weekly  (04/07/2010)     Eating: drink diet soda or water instead of juice or soda, eat more vegetables, use fresh or frozen vegetables, eat fruit for snacks and desserts, limit or avoid alcohol  (04/07/2010)     Activity: take  a 30 minute walk every day  (04/07/2010)    Diabetes self-management support: Copy of home glucose meter record, Written self-care plan, Education handout, Resources for patients handout  (04/07/2010)   Diabetes care plan printed   Diabetes education handout printed   Last diabetes self-management training by diabetes educator: 03/30/2008    Hypertension self-management support: Written self-care plan, Education handout, Resources for patients handout   (04/07/2010)   Hypertension self-care plan printed.   Hypertension education handout printed      Resource handout printed.   Process Orders Check Orders Results:     [facility]: ABN not required for this insurance Tests Sent for requisitioning (April 07, 2010 4:37 PM):     04/07/2010: [facility] -- T-Hepatitis B Surface Antibody [[id number]] (signed)     04/07/2010: [facility] -- T-Hepatitis B Surface Antigen [[id number]] (signed)     04/07/2010: [facility] -- T-Hepatitis C Antibody [[id number]] (signed)     04/07/2010: [facility] -- T-Hepatitis A Antibody [[id number]] (signed)     04/07/2010: [facility] -- T-Hepatitis A Antibody, IGM [[id number]] (signed)

## 2010-11-18 NOTE — Letter (Signed)
Summary: DIABETES-LOG BOOK REPORT  DIABETES-LOG BOOK REPORT   Imported By: Shon Hough 05/12/2010 15:55:05  _____________________________________________________________________  External Attachment:    Type:   Image     Comment:   External Document

## 2010-11-18 NOTE — Letter (Signed)
Summary: BLOOD GLUCOSE 03-09-2010-04-07-2010  BLOOD GLUCOSE 03-09-2010-04-07-2010   Imported By: Margie Billet 04/08/2010 15:02:05  _____________________________________________________________________  External Attachment:    Type:   Image     Comment:   External Document

## 2010-11-18 NOTE — Letter (Signed)
Summary: BLOOD SUGAR 05-09-/06-07  BLOOD SUGAR 05-09-/06-07   Imported By: Margie Billet 03/27/2010 10:59:25  _____________________________________________________________________  External Attachment:    Type:   Image     Comment:   External Document

## 2010-11-18 NOTE — Progress Notes (Signed)
Summary: diabetes support/dmr  Phone Note Outgoing Call   Call placed by: Jamison Neighbor RD,CDE,  September 04, 2010 11:09 AM Summary of Call: Tried to call patient to chedule folow up DSMT and alert her that flu shot & foot exam are due. Main Listed phone number disconnected. called secondary number-left message. unable to reach patient by phone - will mail letter reminding patient of above suggested care.[facility]

## 2010-11-18 NOTE — Assessment & Plan Note (Signed)
Summary: EST-CK/FU/MEDS/CFB   Vital Signs:  Patient profile:   30 year old female Height:      62 inches (157.48 cm) Weight:      176.6 pounds (80.27 kg) BMI:     32.42 Temp:     98.2 degrees F (36.78 degrees C) oral Pulse rate:   108 / minute BP sitting:   143 / 88  (right arm) Cuff size:   regular  Vitals Entered By: Cynda Familia Duncan Dull) (August 05, 2010 3:19 PM) CC: Hypertension Management Is Patient Diabetic? Yes Did you bring your meter with you today? Yes Nutritional Status BMI of > 30 = obese CBG Result 160  Does patient need assistance? Functional Status Self care Ambulation Normal   Primary Care Provider:  Rosana Berger MD  CC:  Hypertension Management.  History of Present Illness: 30 yo female with PMH of DM, elevated transaminases presents for follow-up.  she checks her BP at home 100/60s.  Home BS is 85-257 with 2 lows of 59 & 68.  She had a sinus infection last week and went to urgent care doctor who prescribed her amoxillin and prednisone 5mg  , she took 4 days of prednisone and her BS became really high (346) so she stopped; however, she did finish her 10 day abx course. Other than that, she is compliant with all her meds, excercising and diet. Concerns about her elevated liver enzymes.  Admitted to drinking occasionally in the past but now has stopped, denied taking tylenol.   Hypertension History:      Positive major cardiovascular risk factors include diabetes and hypertension.  Negative major cardiovascular risk factors include female age less than 49 years old and non-tobacco-user status.    Allergies: No Known Drug Allergies  Physical Exam  General:  alert, well-developed, well-nourished, and well-hydrated.   Lungs:  normal respiratory effort, no intercostal retractions, no accessory muscle use, and normal breath sounds.   Heart:  Slightly tachycardic, regular rhythm, no murmur, no gallop, no rub, and no JVD.   Abdomen:  soft, non-tender, normal  bowel sounds, no distention, and no masses.   Extremities:  No clubbing, cyanosis, edema, or deformity noted with normal full range of motion of all joints.   Neurologic:  alert & oriented X3 and cranial nerves II-XII intact.     Impression & Recommendations:  Problem # 1:  TRANSAMINASES, SERUM, ELEVATED (ICD-790.4) AST 97 and ALT 130 in july 2011 which elevated from 37 and 67 respectively in 2009.   Could be medication induced ie. glyburide/lisinopril, autoimmune hepatitis, NASH or hemochromotosis.  She had a negative abdominal ultrasound and negative hepatitis panel.  She did have a history of drinking alcohol; however, the amount is uncertain.  Denied taking any tylenol. I will recheck her LFTs again today and check saturation transferrin (serum iron/TIBC) level to rule out hemochromotosis.    Orders: T- * Misc. Laboratory test [phone]) T-Hepatic Function [phone])  Problem # 2:  DIABETES MELLITUS, TYPE II (ICD-250.00) Well controlled with HbA1c of 5.9% today.  CBG 160. Continue current medication and exercise, and diet.  Her updated medication list for this problem includes:    Metformin Hcl 1000 Mg Tabs (Metformin hcl) .Marland Kitchen... Take 1 pill by mouth two times a day.    Lisinopril 5 Mg Tabs (Lisinopril) .Marland Kitchen... Take 1 pill by mouth daily.    Glyburide 5 Mg Tabs (Glyburide) .Marland Kitchen... 1 tablet by mouth twice daily  Orders: T- Capillary Blood Glucose ([id number]) T-Hgb A1C (in-house) ([id number])  Problem #  3:  HYPERTENSION (ICD-401.9) Continue current dosage of Lisinopril because she most likely has white coat syndrome in which her BP and pulse elevate whenever she comes to clinic.  She reports of checking her BPs at home and they have been normal 100s/60s.  Her updated medication list for this problem includes:    Lisinopril 5 Mg Tabs (Lisinopril) .Marland Kitchen... Take 1 pill by mouth daily.  Problem # 4:  SINUS TACHYCARDIA (ICD-427.89) Most likely due to white coat syndrome because she reports of pulse  rate of 70-80s at home.  I will not put her on beta blocker for now.  Will consider it in the future if she reports persistent tachycardia even at home.      Problem # 5:  Preventive Health Care (ICD-V70.0) Patient is still a virgin and we discussed HPV vaccine because it would be protective for her once she becomes sexually active.  She agrees to receiving the vaccine but wants it at the next office visit.   Received flu vaccine today  Complete Medication List: 1)  Metformin Hcl 1000 Mg Tabs (Metformin hcl) .... Take 1 pill by mouth two times a day. 2)  Lisinopril 5 Mg Tabs (Lisinopril) .... Take 1 pill by mouth daily. 3)  Glyburide 5 Mg Tabs (Glyburide) .Marland Kitchen.. 1 tablet by mouth twice daily  Hypertension Assessment/Plan:      The patient's hypertensive risk group is category C: Target organ damage and/or diabetes.  Her calculated 10 year risk of coronary heart disease is 1 %.  Today's blood pressure is 143/88.     Patient Instructions: 1)  Get labwork today 2)  Continue current medications, exercise & diet 3)  Follow up with Dr. Anselm Jungling in 3-4 months 4)  I will call you with lab results 5)  Will get HPV vaccine next office visit Prescriptions: LISINOPRIL 5 MG TABS (LISINOPRIL) take 1 pill by mouth daily.  #30 Tablet x 6   Entered and Authorized by:   Rosana Berger MD   Signed by:   Rosana Berger MD on 08/05/2010   Method used:   Electronically to        [facility]       [address]       [place], [place]  [address]       Ph: [phone]       Fax: [phone]   RxID:   [id number]    Orders Added: 1)  T- Capillary Blood Glucose [82948] 2)  T-Hgb A1C (in-house) [[id number]] 3)  T- * Misc. Laboratory test [99999] 4)  T-Hepatic Function [[id number]] 5)  Est. Patient Level III [99213]   Process Orders Check Orders Results:     [facility]: ABN not required for this insurance Tests Sent for requisitioning (August 05, 2010 5:14 PM):     08/05/2010: [facility] -- T- * Misc. Laboratory test [99999] (signed)     08/05/2010: [facility] -- T-Hepatic Function [phone] (signed)     Laboratory Results   Blood Tests   Date/Time Received: August 05, 2010 3:27 PM  Date/Time Reported: Burke Keels  August 05, 2010 3:27 PM   HGBA1C: 5.9%   (Normal Range: Non-Diabetic - 3-6%   Control Diabetic - 6-8%) CBG Random:: 160mg /dL     Prevention & Chronic Care Immunizations   Influenza vaccine: Historical  (09/27/2006)   Influenza vaccine deferral: Deferred  (03/25/2010)    Tetanus booster: Not  documented   Td booster deferral: Deferred  (03/25/2010)    Pneumococcal vaccine: Not documented   Pneumococcal vaccine deferral: Deferred  (03/25/2010)  Other Screening   Pap smear: Not documented   Pap smear action/deferral: Deferred  (03/25/2010)   Smoking status: never  (06/05/2010)  Diabetes Mellitus   HgbA1C: 5.9  (08/05/2010)   HgbA1C action/deferral: Ordered  (03/25/2010)   Hemoglobin A1C due: 09/05/2010    Eye exam: Mild non-proliferative diabetic retinopathy.   Visual acuity OD (best corrected):     20/30 Visual acuity OS (best corrected):     20/30 Intraocular pressure OD:     16 Intraocular pressure OS:     15   (01/25/2008)   Diabetic eye exam action/deferral: Ophthalmology referral  (06/05/2010)   Eye exam due: 08/19/2010    Foot exam: Not documented   Foot exam action/deferral: Deferred   High risk foot: Not documented   Foot care education: Not documented   Foot exam due: 09/05/2010    Urine microalbumin/creatinine ratio: 17.4  (03/31/2010)   Urine microalbumin action/deferral: Ordered    Diabetes flowsheet reviewed?: Yes   Progress toward A1C goal: At goal  Hypertension   Last Blood Pressure: 143 / 88  (08/05/2010)   Serum creatinine: 0.90  (05/07/2010)   Serum potassium 4.0  (05/07/2010)    Hypertension flowsheet reviewed?: Yes    Progress toward BP goal: Deteriorated  Self-Management Support :   Personal Goals (by the next clinic visit) :     Personal A1C goal: 6  (03/25/2010)     Personal blood pressure goal: 130/80  (03/25/2010)     Personal LDL goal: 100  (03/25/2010)    Diabetes self-management support: Resources for patients handout, Written self-care plan  (06/05/2010)   Last diabetes self-management training by diabetes educator: 03/30/2008    Hypertension self-management support: Resources for patients handout, Written self-care plan  (06/05/2010)   Nursing Instructions: Give Flu vaccine today   Appended Document: EST-CK/FU/MEDS/CFB I discussed Ms Weiskopf with Dr Anselm Jungling and I agree with her note as outlined above. Further W/U for elevated transaminases today. Follow HTN for now as it and her HR are nl when checked in a nonhealth care setting. HPV vaccine indicated up to age 43 but as she has not had her "sexual debut" is would still be effective as Dr Anselm Jungling pointed out.

## 2010-11-18 NOTE — Progress Notes (Signed)
Summary: sinus, appt/ hla  Phone Note Call from Patient   Caller: Patient Summary of Call: pt called left message that she could not get an appt for this pm and she feels that she may have a sinus infection, she wanted an abx called in for this problem. when i returned the call i found that someone had cancelled and offered the appt to her, she then stated she had not wanted to be seen anyway, she states she will call back for an appt if she decides she needs it Initial call taken by: Marin Roberts RN,  February 06, 2010 10:55 AM

## 2010-11-18 NOTE — Assessment & Plan Note (Signed)
Summary: RA/NEEDS 1 MONTH F/U VISIT/CH   Vital Signs:  Patient profile:   30 year old female Height:      62 inches (157.48 cm) Weight:      174.9 pounds (79.50 kg) Temp:     97.8 degrees F (36.56 degrees C) oral Pulse rate:   120 / minute BP sitting:   133 / 88  (left arm) Cuff size:   regular  Vitals Entered By: Cynda Familia Duncan Dull) (May 06, 2010 3:24 PM)  CC: f/u Is Patient Diabetic? Yes Did you bring your meter with you today? Yes Pain Assessment Patient in pain? no      Nutritional Status BMI of > 30 = obese  Have you ever been in a relationship where you felt threatened, hurt or afraid?No   Does patient need assistance? Functional Status Self care Ambulation Normal   Primary Care Provider:  Rosana Berger MD  CC:  f/u.  History of Present Illness: 30 year old female with PMH HTN, DM type II (dx 4 yrs ago) presents today for follow-up.  She is tolerating the increase dosage of Glipizide well, no complaints.  She checks 4 times per day ranging from 76-170).  She is feeling a lot better however she does feel a little nervous whenever she comes to the doctor office.  She last seen Dr. Nelle Don about 2 years ago for eye exam.  Denies any headache, chestpain, SOB, N/V, fever, chills, blurry vision, numbness and tingling.  Patient reports never had a pap smear done before.     Preventive Screening-Counseling & Management  Alcohol-Tobacco     Smoking Status: never     Passive Smoke Exposure: yes  Allergies: No Known Drug Allergies  Review of Systems  The patient denies anorexia, fever, weight loss, weight gain, vision loss, decreased hearing, hoarseness, chest pain, syncope, dyspnea on exertion, peripheral edema, prolonged cough, headaches, hemoptysis, abdominal pain, melena, hematochezia, severe indigestion/heartburn, hematuria, incontinence, genital sores, muscle weakness, suspicious skin lesions, transient blindness, difficulty walking, depression,  unusual weight change, abnormal bleeding, enlarged lymph nodes, angioedema, breast masses, and testicular masses.    Physical Exam  General:  alert, well-developed, well-nourished, and well-hydrated.   Head:  normocephalic, atraumatic, no abnormalities observed, and no abnormalities palpated.   Eyes:  vision grossly intact, pupils equal, pupils round, and pupils reactive to light.   Ears:  no external deformities.   Nose:  no external deformity and no nasal discharge.   Mouth:  good dentition, no gingival abnormalities, no dental plaque, and pharynx pink and moist.   Neck:  supple, full ROM, and no masses.   Chest Wall:  no deformities, no tenderness, and no mass.   Breasts:  skin/areolae normal, no masses, and no abnormal thickening.   Lungs:  normal respiratory effort, no intercostal retractions, no accessory muscle use, normal breath sounds, no dullness, no fremitus, no crackles, and no wheezes.   Heart:  regular rhythm, no murmur, no gallop, no rub, no JVD, and no HJR.   Abdomen:  soft, non-tender, normal bowel sounds, no distention, no masses, no guarding, no rigidity, no rebound tenderness, and no abdominal hernia.   Msk:  normal ROM, no joint tenderness, no joint swelling, no joint warmth, no redness over joints, no joint deformities, no joint instability, and no crepitation.   Neurologic:  alert & oriented X3, cranial nerves II-XII intact, strength normal in all extremities, sensation intact to light touch, sensation intact to pinprick, gait normal, and DTRs symmetrical and  normal.   Skin:  turgor normal, color normal, no rashes, and no edema.   Cervical Nodes:  no anterior cervical adenopathy and no posterior cervical adenopathy.   Psych:  Oriented X3, memory intact for recent and remote, normally interactive, good eye contact, and slightly anxious.     Impression & Recommendations:  Problem # 1:  HYPERTENSION (ICD-401.9) Assessment Unchanged Adequately controlled.  Will continue  current medication Her updated medication list for this problem includes:    Lisinopril 5 Mg Tabs (Lisinopril) .Marland Kitchen... Take 1 pill by mouth daily.  Orders: T-CMP with Estimated GFR ([id number]) T-TSH ([id number])  Problem # 2:  DIABETES MELLITUS, TYPE II (ICD-250.00) Assessment: Deteriorated Her home meter shows improvement in blood sugar (76-176); however, her HbA1c in 03/2010 was 10.8.   Will continue Metformin 1000mg  two times a day and Glipizide 10mg  two times a day for now.  I discussed with patient about the option of Victoza injection and she wanted to think about it and will decide at her next follow up appointment in 1 month. Metformin and Glipizide can be stopped if Victoza improves her HbA1c to goal >7.    Continue current medications Her updated medication list for this problem includes:    Metformin Hcl 1000 Mg Tabs (Metformin hcl) .Marland Kitchen... Take 1 pill by mouth two times a day.    Glucotrol 10 Mg Tabs (Glipizide) .Marland Kitchen... Take 1 pill by mouth two times a day.    Lisinopril 5 Mg Tabs (Lisinopril) .Marland Kitchen... Take 1 pill by mouth daily.  Problem # 3:  TRANSAMINASES, SERUM, ELEVATED (ICD-790.4) Assessment: Unchanged Elevated in transaminase could be due to fatty liver since her Hepatitis surface antigens were negative.  Will order abdominal ultrasound to rule out fatty liver and will order CMET.   Advise on alcohol usage.  Orders: T-TSH ([id number]) Diagnostic X-Ray/Fluoroscopy (Diagnostic X-Ray/Flu)  Problem # 4:  SINUS TACHYCARDIA (ICD-427.89) Patient was tachycardic during last office visit 107.  Today her pulse was 120.  EKG showed sinus tachycardia; however, patient was asymptomatic.  Patient was mildly anxious.  I will order TSH level today. -May consider 2-D Echo for any structural abnormality if tachycardia persists. -follow up in 1 month to discuss labs/ultrasound findings  Orders: T-CMP with Estimated GFR ([id number]) T-TSH ([id number])  Complete Medication List: 1)   Metformin Hcl 1000 Mg Tabs (Metformin hcl) .... Take 1 pill by mouth two times a day. 2)  Glucotrol 10 Mg Tabs (Glipizide) .... Take 1 pill by mouth two times a day. 3)  Lisinopril 5 Mg Tabs (Lisinopril) .... Take 1 pill by mouth daily.  Patient Instructions: 1)  1. Continue current medications 2)  Metformin 1000mg  twice daily 3)  Glypizide 10mg  twice daily 4)  Lisinopril 5mg  once daily 5)  2. Ultrasound of abdomen 6)  3.Labs today: CMET and TSH 7)  4. Follow up in 1 month  Prevention & Chronic Care Immunizations   Influenza vaccine: Historical  (09/27/2006)   Influenza vaccine deferral: Deferred  (03/25/2010)    Tetanus booster: Not documented   Td booster deferral: Deferred  (03/25/2010)    Pneumococcal vaccine: Not documented   Pneumococcal vaccine deferral: Deferred  (03/25/2010)  Other Screening   Pap smear: Not documented   Pap smear action/deferral: Deferred  (03/25/2010)   Smoking status: never  (05/06/2010)  Diabetes Mellitus   HgbA1C: 10.8  (03/31/2010)   HgbA1C action/deferral: Ordered  (03/25/2010)    Eye exam: Mild non-proliferative diabetic retinopathy.   Visual acuity OD (best corrected):  20/30 Visual acuity OS (best corrected):     20/30 Intraocular pressure OD:     16 Intraocular pressure OS:     15   (01/25/2008)   Eye exam due: 01/2009    Foot exam: Not documented   Foot exam action/deferral: Do today   High risk foot: Not documented   Foot care education: Not documented    Urine microalbumin/creatinine ratio: 17.4  (03/31/2010)   Urine microalbumin action/deferral: Ordered    Diabetes flowsheet reviewed?: Yes   Progress toward A1C goal: Deteriorated  Hypertension   Last Blood Pressure: 133 / 88  (05/06/2010)   Serum creatinine: 0.79  (03/31/2010)   Serum potassium 3.8  (03/31/2010)    Hypertension flowsheet reviewed?: Yes   Progress toward BP goal: Unchanged  Self-Management Support :   Personal Goals (by the next clinic visit)  :     Personal A1C goal: 6  (03/25/2010)     Personal blood pressure goal: 130/80  (03/25/2010)     Personal LDL goal: 100  (03/25/2010)    Patient will work on the following items until the next clinic visit to reach self-care goals:     Medications and monitoring: take my medicines every day, check my blood sugar  (05/06/2010)     Eating: eat foods that are low in salt, eat baked foods instead of fried foods  (05/06/2010)     Activity: take a 30 minute walk every day  (05/06/2010)    Diabetes self-management support: Pre-printed educational material, Resources for patients handout, Written self-care plan  (05/06/2010)   Diabetes care plan printed   Last diabetes self-management training by diabetes educator: 03/30/2008    Hypertension self-management support: Pre-printed educational material, Resources for patients handout, Written self-care plan  (05/06/2010)   Hypertension self-care plan printed.      Resource handout printed.  Process Orders Check Orders Results:     [facility]: ABN not required for this insurance Tests Sent for requisitioning (May 16, 2010 12:40 AM):     05/06/2010: [facility] -- T-CMP with Estimated GFR [[id number]] (signed)     05/06/2010: [facility] -- T-TSH [id number] (signed)     Patient Instructions: 1)  1. Continue current medications 2)  Metformin 1000mg  twice daily 3)  Glypizide 10mg  twice daily 4)  Lisinopril 5mg  once daily 5)  2. Ultrasound of abdomen 6)  3.Labs today: CMET and TSH 7)  4. Follow up in 1 month   Appended Document: RA/NEEDS 1 MONTH F/U VISIT/CH I have discussed the care of this patient in detail with the resident and agree fully with the documentation completed.

## 2010-11-24 ENCOUNTER — Other Ambulatory Visit: Payer: Self-pay | Admitting: *Deleted

## 2010-11-24 MED ORDER — METFORMIN HCL 1000 MG PO TABS: 1000.0000 mg | ORAL_TABLET | Freq: Two times a day (BID) | ORAL | Status: DC

## 2010-11-24 NOTE — Telephone Encounter (Signed)
Last refill 11/20/10

## 2010-12-22 ENCOUNTER — Other Ambulatory Visit: Payer: Self-pay | Admitting: Internal Medicine

## 2010-12-31 LAB — GLUCOSE, CAPILLARY: Glucose-Capillary: 160 mg/dL — ABNORMAL HIGH (ref 70–99)

## 2011-01-02 LAB — GLUCOSE, CAPILLARY: Glucose-Capillary: 100 mg/dL — ABNORMAL HIGH (ref 70–99)

## 2011-01-14 ENCOUNTER — Other Ambulatory Visit: Payer: Self-pay | Admitting: *Deleted

## 2011-01-14 MED ORDER — GLYBURIDE 5 MG PO TABS: 5.0000 mg | ORAL_TABLET | Freq: Two times a day (BID) | ORAL | Status: DC

## 2011-03-06 NOTE — Discharge Summary (Signed)
NAME:  Kayla Erickson, HELMING NO.:  [id number]   MEDICAL RECORD NO.:  [id number]          PATIENT TYPE:  INP   LOCATION:  4729                         FACILITY:  [facility]   PHYSICIAN:  Madaline Guthrie, M.D.    DATE OF BIRTH:  1981/05/14   DATE OF ADMISSION:  02/12/2006  DATE OF DISCHARGE:  02/16/2006                                 DISCHARGE SUMMARY   DISCHARGE DIAGNOSES:  1.  Newly diagnosed type 1 diabetes mellitus, presenting in diabetic      ketoacidosis.  2.  Severe potassium depletion.   DISCHARGE MEDICATIONS:  Insulin 70/30 22 units before breakfast and 10 units  before dinner.   Patient will follow up with me, Dr. Ardyth Harps, in the [facility] outpatient  clinic on May 8th at 2:30 p.m., and she will see Jamison Neighbor, our diabetes  educator, that same day, at 1:30 p.m.   HISTORY AND PHYSICAL EXAMINATION:  For full details, please refer to the  chart, but in brief, Ms. Kayla Erickson is a 30 year old white female with no past  medical history, who presents with a one day history of chest pain,  substernal, with no radiation, also associated with dyspnea on exertion and  palpitations.  Also states that over the past two months, she has lost 50  pounds and has positive polyuria, polydipsia, and polyphagia.  She also  feels very fatigued.   She has no known drug allergies.   Substance history is negative and no history of diabetes in her family.  She  works as a Consulting civil engineer.   PHYSICAL EXAMINATION:  VITAL SIGNS ON ADMISSION:  Temperature 48.1.  Orthostatic vital signs.  Lying down 118/82 with a heart rate of 132.  Standing 117/82 with a heart rate of 142.  Her heart rate was 120,  respirations 20, O2 saturation 99% on room air.  HEENT:  She was awake,  alert and oriented x3 in no acute distress with very dry mucous membranes.  LUNGS:  She was clear to auscultation bilaterally.  HEART:  Tachycardic.  No murmurs auscultated.  ABDOMEN:  Benign.  SKIN:  She has dry skin with  decreased turgor.   LABS UPON ADMISSION:  Sodium 130, potassium, undetectable at less than 2,  chloride 84, bicarb 14.  BUN 8, creatinine 1.7, glucose 580.  That gives her  an anion gap of 32.  Her bilirubin was 1.8, alk phos 128.  AST 18, ALT 20,  protein 7.6, albumin 4.1 with a calcium of 9.6.  Her UDS was negative.  Her  initial ABG showed a pH of 7.36, CO2 17.3, O2 134, bicarb 10.  Her urine  pregnancy test was negative.   Initial EKG showed T wave inversions in the inferior leads II, III, and aVF,  and diffuse U waves.  She also had a large amount of acetone in her urine.   HOSPITAL COURSE:  Problem 1:  Diabetic ketoacidosis:  Initially, we could  not treat it with insulin, given her severe potassium depletion.  So what we  had to do initially was aggressively replace her potassium.  In total, she  received over 600 mEq of potassium.  Once her potassium was 3.5, we placed  her on the insulin drip and treated her as a regular DKA with aggressive IV  fluid hydration, insulin drips, post monitoring of her electrolytes and her  blood sugars.  Her hemoglobin A1C was very elevated at 15.6.  She also  received aggressive calcium, magnesium, and phosphorus supplementation, all  of which were very low.  Upon discharge, she has a 2D echo, which results  are pending, and the only reason we have done this is for her persistent  tachycardia, even though her EKG changes have reverted on her EKGs on the  day of discharge.  Cardiac enzymes were negative, as would be expected in a  young 30 year old.  We decided to discharge her for now, as she has no  insurance and very little diabetes eduction on a regimen of 70/30 insulin.  What we have done, we have decided to do 2/3 of her regimen as basal and 1/3  of her regimen as insulin to cover her meals, so have decided to do 22 units  before breakfast and 10 units before dinner.  This is not the best regimen  for type 1 diabetics, as this does not cover  meals.  Eventually, I think the  best for her would be an insulin pump, but first of all, she would need  insurance, as this tends to be very expensive, and she would need very  extensive diabetes education, especially on how to count carbs, in order to  do this.  At this time, I believe that what would be best for her, since we  do not have insulin pump availability, but we can have her on a basilar  regimen of Lantus and just cover her meals three times a day with regular or  NovoLog insulin, but we will follow up on this at our next appointment, at  which time she will have a STAT BMET and a STAT CBG to monitor her.   Next, her vitals on the day of discharge, her blood pressure was 102/71,  temperature 98.2, heart rate 114, respirations 16.  Her O2 sats are 99% on  room air.  CBGs ranging between 160 and 230.   LABS ON THE DAY OF DISCHARGE:  WBCs 3.5, hemoglobin 11.6, hematocrit 33.2,  platelets 110.  Sodium 139, potassium 3.7, chloride 105, bicarb 37, glucose  384, BUN 1, creatinine 0.7.      Peggye Pitt, M.D.      Madaline Guthrie, M.D.  Electronically Signed    EH/MEDQ  D:  02/20/2006  T:  02/22/2006  Job:  [id number]

## 2011-03-06 NOTE — Consult Note (Signed)
NAME:  Kayla Erickson, LUBA NO.:  [id number]   MEDICAL RECORD NO.:  [id number]          PATIENT TYPE:  INP   LOCATION:  3737                         FACILITY:  [facility]   PHYSICIAN:  Dyke Maes, M.D.DATE OF BIRTH:  01/27/1981   DATE OF CONSULTATION:  DATE OF DISCHARGE:                                   CONSULTATION   CONSULTING PHYSICIAN:  Dyke Maes, M.D.   REFERRING PHYSICIAN:  Ileana Roup, M.D.   REASON FOR CONSULTATION:  Hypomagnesemia.   HISTORY OF PRESENT ILLNESS:  This 30 year old white female who is admitted  for hypomagnesemia.  The patient was recently diagnosed with diabetes when  she presented with DKA two weeks ago.  During that hospitalization, she had  a low potassium and magnesium which were replaced.  At the time of  discharge, her magnesium was in the normal range of 1.9 on February 14, 2006.  On admission, serum magnesium level was 1.0 and after replacement today is  up to 2.0.  A 24-hour urine for magnesium was done last admission but it un-  interpretable as there is no volume recorded.  The patient admits to good  eating habits and denies the use of any diuretics or laxatives.  She denies  any diarrhea.  She did have significant polyuria for two or three months  prior to her admission for new onset diabetes.   PAST MEDICAL HISTORY:  Significant for diabetes.   ALLERGIES:  None.   MEDICATIONS:  Insulin, oral potassium, and IV magnesium.   SOCIAL HISTORY:  Nonsmoker, nondrinker.  She is single.  She is a Consulting civil engineer in  Therapist, nutritional.  She lives with her mother.   FAMILY HISTORY:  Negative for any family history of hypomagnesemia.   REVIEW OF SYSTEMS:  Appetite is good.  Energy level is good.  No shortness  of breath, PND, orthopnea.  No chest pain, chest pressures.  Her polyuria  has resolved.  No new skin lesions.  No arthritic complaints.  The rest of  the review of systems unremarkable.   PHYSICAL EXAMINATION:  VITAL  SIGNS:  Blood pressure 93/67, pulse 84,  temperature is 98.  GENERAL:  A healthy-appearing 30 year old white female in no acute distress.  HEENT:  Sclerae are nonicteric.  Extraocular muscles intact.  LUNGS:  Clear to auscultation.  HEART:  Regular rate and rhythm without a murmur, rub, or gallop.  ABDOMEN:  Nontender, nondistended.  No hepatosplenomegaly.  EXTREMITIES:  No clubbing, cyanosis, or edema.  Pulses 2/4 and equal  throughout.  NEUROLOGIC:  Cranial nerves intact.  Motor and sensory intact.   LABORATORY:  Sodium 142, potassium 3.8, bicarb 30, creatinine 0.9, calcium  9.0, magnesium 2.0.   IMPRESSION:  Hypomagnesemia.   I suspect she is total body magnesium depleted from polyuria of over two to  three months and simply needs more replacement.  Other less common diseases  such as __________  is a possibility but I would only consider further  workup for this if her magnesium remains persistently low despite adequate  repletion.  Adequate repletion may take many many  grams of magnesium.   RECOMMENDATIONS:  1.  I would repeat her 24-hour urine excretion for magnesium and make sure      that a volume is recorded.  2.  I would start oral magnesium.  3.  I would continue with IV magnesium as needed.  4.  Recheck magnesium in the morning.   Thank you very much.  I will follow the patient with you.           ______________________________  Dyke Maes, M.D.     MTM/MEDQ  D:  02/25/2006  T:  02/26/2006  Job:  [id number]

## 2011-03-06 NOTE — Discharge Summary (Signed)
Kayla Erickson, Kayla Erickson NO.:  [id number]   MEDICAL RECORD NO.:  [id number]          PATIENT TYPE:  INP   LOCATION:  3737                         FACILITY:  [facility]   PHYSICIAN:  Ileana Roup, M.D.  DATE OF BIRTH:  [birth year]   DATE OF ADMISSION:  02/24/2006  DATE OF DISCHARGE:  02/27/2006                                 DISCHARGE SUMMARY   DISCHARGE DIAGNOSES:  1.  Severe hypomagnesemia/hypokalemia with metabolic alkalosis, highly      suspicious for Gitelman's syndrome.  2.  Diabetes mellitus, likely type 2 with recent history of diabetic      ketoacidosis.  3.  Mildly elevated transaminases.   DISCHARGE MEDICATIONS:  1.  Insulin 70/30 22 units in the morning and 12 units at night.  2.  Magnesium oxide 800 mg b.i.d.  3.  Multivitamin daily.   DISCHARGE CONDITION:  Stable.   CONSULTATIONS:  Nephrology.   PROCEDURE:  None.   FOLLOW-UP APPOINTMENTS:  With Dr. Ardyth Harps in the outpatient clinic.  Patient is to contact the clinic at [phone] regarding a follow-up  appointment.   HISTORY OF PRESENT ILLNESS:  Kayla Erickson is a 30 year old Caucasian female  who was recently discharged from our service with severe diabetic  ketoacidosis and hypokalemia, who presents today for hospital followup in  our clinic with a magnesium level of 0.9 mg/dl but was essentially  asymptomatic.  She denied any muscle weakness, chest pain, palpitations,  shortness of breath, or other physical ailments.   HOME MEDICATIONS:  Insulin 70/30 22 units in the morning and 10 units in the  evening.   SUBSTANCE HISTORY:  Patient denied any alcohol, cocaine, or tobacco use.   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   ADMISSION PHYSICAL:  VITAL SIGNS:  Temperature 98.3, blood pressure 111/77,  pulse 110, respirations 20, O2 sat 99% on room air.  GENERAL:  Patient is alert and oriented in no acute  distress.  She had a  negative Chvostek's sign.  HEENT:  Unremarkable for any abnormalities.  NECK:  Supple with no adenopathy or thyromegaly.  LUNGS:  Clear to auscultation bilaterally with a question soft systolic  murmur.  ABDOMEN:  Soft, nontender, nondistended with positive bowel sounds.  NEUROLOGIC:  There were no gross focal deficits.  Cranial nerves II-XII were  essentially intact.   ADMISSION LABORATORY STUDIES:  Sodium 142, potassium 3.3, chloride 101,  bicarb 30, BUN 13, creatinine 0.9, glucose 84, magnesium 0.9 on Feb 23, 2006.  Magnesium was 1.9 on February 14, 2006.  AST was 58, ALT 51, albumin 4, calcium  9.7, protein 6.7, alk phos 127, bilirubin 0.6.  Microalbumin to creatinine  ratio 6.1.  Urinalysis was negative for blood and protein.  Specific gravity  is 1.007 with a pH of 6.5.   HOSPITAL COURSE:  1.  Severe hypomagnesemia and hypokalemia:  Kayla Erickson was admitted to a      general telemetry floor and repleted with 1 mg/kg over 24 hours of  magnesium sulfate.  This amounted to 9 gm.  Then was supplemented with      0.5 mg/kg for the next 24 hours over the course of the next two days.      The patient had no significant side effects related to this therapy.      Eventually, the patient was started on p.o. magnesium oxide, and      nephrology was consulted for assistance regarding other suggestions for      workup.  The presumptive theory, given the patient's hypomagnesemia,      hypokalemia, and metabolic alkalosis was that she had some sort of      disorder such as Gitelman's syndrome.  A random urine calcium was      obtained, which was somewhat low; however, at 3 mg/kg, 24-hour urine      studies were recommended to include 24-hour urine collection for      magnesium.  Nephrology suspected that the patient was totally magnesium-      depleted from her polyuria for the last 2-3 months and simply needed      more displacement; however, certainly Gitelman's was in  the differential      diagnosis, but they only suggested considering this, should the      patient's magnesium continue to remain low despite adequate depletion.      Once this 24-hour urine study was finally completed, the patient was      discharged with a prescription for multivitamins as well as magnesium      oxide 800 mg b.i.d.  She was instructed that should she have any      significant diarrhea or GI complaints, she is to titrate this down to      400 mg b.i.d.  By the day of discharge, her magnesium was noted to be      1.8.   1.  Diabetes mellitus, likely type 2:  The patient had repeat C peptide,      pancreatic islet cell antibody and glutamic acid decarboxylase antibody      studies performed, all of which were essentially unremarkable.  Her C      peptide level was noted to be normal at 1.01 ng/ml.  In addition,      vitamin D serum 25 hydroxy level was performed, which was normal.  Urine      pregnancy was also unremarkable.  TSH was 0.524.  Patient was advised to      continue taking her 70/30 therapy, which was started at the last      discharge.  Followup will be made regarding this therapy.   The patient was discharged in stable condition with plans as above.   The results of the 24-hour urine magnesium study were not available at the  time of this dictation; however, these results will be discussed with the  patient upon her followup in the outpatient clinic setting.   PERTINENT LABORATORY STUDIES:  Pancreatic islet cell was negative.  Vitamin  D hydroxy level 26 ng/ml.  ESR was 18 mm/hr.  ANA was negative.  Vitamin B12  297 pg/ml was normal.  Ferritin 242 ng/ml, which was  normal.  RBC folate was normal.  Urine pregnancy was negative.  TSH was  0.524, unremarkable.  Urine drug screen negative.  Random calcium in the  urine was 3 mg/dl with random urine of 84 mEq/L and random potassium of 12  mEq/L.      Kayla Erickson,  M.D.  ______________________________  Ileana Roup, M.D.    FR/MEDQ  D:  03/02/2006  T:  03/02/2006  Job:  [id number]   cc:   Peggye Pitt, M.D.  Fax: [phone]   Dyke Maes, M.D.  Fax: [phone]

## 2011-03-10 ENCOUNTER — Encounter: Payer: Self-pay | Admitting: Internal Medicine

## 2011-03-10 DIAGNOSIS — J309 Allergic rhinitis, unspecified: Secondary | ICD-10-CM | POA: Insufficient documentation

## 2011-03-10 DIAGNOSIS — E118 Type 2 diabetes mellitus with unspecified complications: Secondary | ICD-10-CM | POA: Insufficient documentation

## 2011-03-10 DIAGNOSIS — E119 Type 2 diabetes mellitus without complications: Secondary | ICD-10-CM

## 2011-04-28 ENCOUNTER — Other Ambulatory Visit: Payer: Self-pay | Admitting: *Deleted

## 2011-04-28 MED ORDER — LISINOPRIL 5 MG PO TABS: 5.0000 mg | ORAL_TABLET | Freq: Every day | ORAL | Status: DC

## 2011-06-19 ENCOUNTER — Other Ambulatory Visit: Payer: Self-pay | Admitting: Internal Medicine

## 2011-06-20 NOTE — Telephone Encounter (Signed)
Refill approved but no other refills because she needs to come in for follow up on her DM.  I last saw patient in 08/05/2010.  She needs HbA1c checked soon.

## 2011-12-21 ENCOUNTER — Other Ambulatory Visit: Payer: Self-pay | Admitting: Internal Medicine

## 2011-12-22 ENCOUNTER — Other Ambulatory Visit: Payer: Self-pay | Admitting: *Deleted

## 2011-12-22 MED ORDER — METFORMIN HCL 1000 MG PO TABS: 1000.0000 mg | ORAL_TABLET | Freq: Two times a day (BID) | ORAL | Status: DC

## 2011-12-28 ENCOUNTER — Encounter: Payer: Self-pay | Admitting: Internal Medicine

## 2011-12-28 ENCOUNTER — Other Ambulatory Visit: Payer: Self-pay | Admitting: *Deleted

## 2011-12-28 ENCOUNTER — Ambulatory Visit (INDEPENDENT_AMBULATORY_CARE_PROVIDER_SITE_OTHER): Payer: Self-pay | Admitting: Internal Medicine

## 2011-12-28 VITALS — BP 146/82 | HR 118 | Temp 98.7°F | Ht 63.0 in | Wt 191.8 lb

## 2011-12-28 DIAGNOSIS — I10 Essential (primary) hypertension: Secondary | ICD-10-CM

## 2011-12-28 DIAGNOSIS — Z79899 Other long term (current) drug therapy: Secondary | ICD-10-CM

## 2011-12-28 DIAGNOSIS — E785 Hyperlipidemia, unspecified: Secondary | ICD-10-CM

## 2011-12-28 DIAGNOSIS — I498 Other specified cardiac arrhythmias: Secondary | ICD-10-CM

## 2011-12-28 DIAGNOSIS — R7401 Elevation of levels of liver transaminase levels: Secondary | ICD-10-CM

## 2011-12-28 DIAGNOSIS — Z23 Encounter for immunization: Secondary | ICD-10-CM

## 2011-12-28 DIAGNOSIS — E119 Type 2 diabetes mellitus without complications: Secondary | ICD-10-CM

## 2011-12-28 LAB — POCT GLYCOSYLATED HEMOGLOBIN (HGB A1C): Hemoglobin A1C: 7.4

## 2011-12-28 LAB — GLUCOSE, CAPILLARY: Glucose-Capillary: 228 mg/dL — ABNORMAL HIGH (ref 70–99)

## 2011-12-28 MED ORDER — LISINOPRIL 5 MG PO TABS: 5.0000 mg | ORAL_TABLET | Freq: Every day | ORAL | Status: DC

## 2011-12-28 NOTE — Assessment & Plan Note (Addendum)
Hemoglobin A1c today is 7.4 with an average glucose of 166 which is trending up from 5.9 last time I saw her. Patient reports that the glyburide  gives her hypoglycemic episodes. Patient does not want to be on extra medication at this time And does not want to be on insulin. I review her meter which range from 100s to 400s. -Will stop Glyburide -Continue Metformin 1000mg  bid -Continue ACEi -Urine Micro alb/Cr ratio was 17.4 in June of 2011. Will not recheck another one today because patient does not have medical insurance -Will get lipid panel and CMP today -I discussed the importance of diet and exercise to help bring her hemoglobin A1c down -Instructed patient to followup with Ophthalmology  -I will see patient back in 3 months  -Will get flu and tetanus vaccinations today

## 2011-12-28 NOTE — Assessment & Plan Note (Addendum)
Chronic. Pulse on arrival was 118, repeat pulse was 110. She denies any chest pain, shortness of breath. She states that whenever she is at the doctor's office, her heart rate increases . This is likely secondary to anxiety/white coat syndrome. -Check her TSH in 2011 which was within normal limit. I have asked patient to check her pulse home or at the pharmacy to see if her pulse is still elevated. -Will recheck a TSH again today -She may benefit from a beta blocker.

## 2011-12-28 NOTE — Progress Notes (Signed)
Addended by: Maura Crandall on: 12/28/2011 04:03 PM   Modules accepted: Orders

## 2011-12-28 NOTE — Assessment & Plan Note (Addendum)
Not well-controlled, however patient reports not taking her blood pressure meds for today. I will not increase her lisinopril today and keep at the same dosage.  It is still elevated next office visit, I will increase her dosage to 10 mg by mouth daily -Lisinopril 5 mg by mouth daily

## 2011-12-28 NOTE — Patient Instructions (Signed)
Diet and exercise Get labs today and I will call you with any abnormal results Stop Glyburide Continue metformin 1000 mg twice daily Follow up with Dr. Anselm Jungling in 3 months

## 2011-12-28 NOTE — Progress Notes (Signed)
History of present illness: Miss Kayla Erickson is a 31 yo W with PMH diabetes type 2, ADHD, hypertension, sinus tachycardia, elevated transaminase with negative abdominal ultrasounds presents today for followup. Patient has not been seen in the past over one year. She had 3 episodes of low sugars in 50's about 1 month ago.  She noticed hypoglycemia after taking Glyburide especially in the early evening. The hypoglycemic events did not require hospitalization, she just ate something and it went back up.  Been checking her blood sugar 1-2 days.  Meter reviewed: 100-400's. Average of 166. She has been gaining weight and states that she can eat better though.   Denies any numbness and tingling, or vision problem or chest pain.  She last saw opthal about 1 year ago. Did not take her BP med today yet. Wants RF on Lisinopril. Will get flu shot and tetanus shots today No Pap smear as patient is still a virgin.    Review of system: As per history of present illness  Physical examination General: alert, well-developed, and cooperative to examination.  Head: normocephalic and atraumatic.  Eyes: vision grossly intact, pupils equal, pupils round, pupils reactive to light, no injection and anicteric.  Mouth: pharynx pink and moist, no erythema, and no exudates.  Neck: supple, full ROM, no thyromegaly, no JVD, and no carotid bruits.  Lungs: normal respiratory effort, no accessory muscle use, normal breath sounds, no crackles, and no wheezes. Heart: normal rate, regular rhythm, no murmur, no gallop, and no rub.  Abdomen: soft, non-tender, normal bowel sounds, no distention, no guarding, no rebound tenderness Msk: no joint swelling, no joint warmth, and no redness over joints.  Pulses: 2+ DP/PT pulses bilaterally Extremities: No cyanosis, clubbing, edema Neurologic: alert & oriented X3, cranial nerves II-XII intact, strength normal in all extremities, sensation intact to light touch, and gait normal.  .

## 2011-12-28 NOTE — Assessment & Plan Note (Signed)
LFTs appear to be trending down. This is likely secondary to alcohol use. She reports occasional alcohol intake. Patient did have ultrasounds of the abdomen in 2007 in 2011 which were essentially negative. -I'll recheck her LFTs today

## 2011-12-29 LAB — COMPLETE METABOLIC PANEL WITH GFR
AST: 50 U/L — ABNORMAL HIGH (ref 0–37)
Alkaline Phosphatase: 97 U/L (ref 39–117)
BUN: 17 mg/dL (ref 6–23)
CO2: 26 mEq/L (ref 19–32)
Creat: 0.82 mg/dL (ref 0.50–1.10)
GFR, Est Non African American: >89 mL/min
Glucose, Bld: 193 mg/dL — ABNORMAL HIGH (ref 70–99)
Potassium: 3.9 mEq/L (ref 3.5–5.3)
Total Protein: 6.9 g/dL (ref 6.0–8.3)

## 2011-12-29 LAB — LIPID PANEL
Cholesterol: 141 mg/dL (ref 0–200)
HDL: 68 mg/dL (ref >39)
LDL Cholesterol: 62 mg/dL (ref 0–99)
Triglycerides: 57 mg/dL (ref <150)
VLDL: 11 mg/dL (ref 0–40)

## 2011-12-29 LAB — MICROALBUMIN / CREATININE URINE RATIO: Microalb Creat Ratio: 34 mg/g — ABNORMAL HIGH (ref 0.0–30.0)

## 2012-01-27 ENCOUNTER — Other Ambulatory Visit: Payer: Self-pay | Admitting: Internal Medicine

## 2012-07-30 ENCOUNTER — Encounter (HOSPITAL_COMMUNITY): Payer: Self-pay | Admitting: *Deleted

## 2012-07-30 ENCOUNTER — Observation Stay (HOSPITAL_COMMUNITY)
Admission: EM | Admit: 2012-07-30 | Discharge: 2012-08-01 | Disposition: A | Discharge status: Home / Self Care | Payer: Self-pay | Attending: Internal Medicine | Admitting: Internal Medicine

## 2012-07-30 DIAGNOSIS — E872 Acidosis, unspecified: Secondary | ICD-10-CM | POA: Insufficient documentation

## 2012-07-30 DIAGNOSIS — F10929 Alcohol use, unspecified with intoxication, unspecified: Secondary | ICD-10-CM | POA: Diagnosis present

## 2012-07-30 DIAGNOSIS — R000 Tachycardia, unspecified: Secondary | ICD-10-CM | POA: Insufficient documentation

## 2012-07-30 DIAGNOSIS — Z23 Encounter for immunization: Secondary | ICD-10-CM | POA: Insufficient documentation

## 2012-07-30 DIAGNOSIS — R112 Nausea with vomiting, unspecified: Secondary | ICD-10-CM | POA: Insufficient documentation

## 2012-07-30 DIAGNOSIS — E119 Type 2 diabetes mellitus without complications: Principal | ICD-10-CM | POA: Insufficient documentation

## 2012-07-30 DIAGNOSIS — F1010 Alcohol abuse, uncomplicated: Secondary | ICD-10-CM | POA: Insufficient documentation

## 2012-07-30 DIAGNOSIS — E876 Hypokalemia: Secondary | ICD-10-CM | POA: Insufficient documentation

## 2012-07-30 DIAGNOSIS — I10 Essential (primary) hypertension: Secondary | ICD-10-CM | POA: Insufficient documentation

## 2012-07-30 DIAGNOSIS — R748 Abnormal levels of other serum enzymes: Secondary | ICD-10-CM | POA: Insufficient documentation

## 2012-07-30 DIAGNOSIS — R739 Hyperglycemia, unspecified: Secondary | ICD-10-CM

## 2012-07-30 DIAGNOSIS — R61 Generalized hyperhidrosis: Secondary | ICD-10-CM | POA: Insufficient documentation

## 2012-07-30 HISTORY — DX: Essential (primary) hypertension: I10

## 2012-07-30 NOTE — ED Notes (Signed)
Bed:WA08<BR> Expected date:<BR> Expected time:<BR> Means of arrival:<BR> Comments:<BR> ems

## 2012-07-30 NOTE — ED Notes (Signed)
Pt reports that she was out at a party this pm and states she was "drinking multiple beers and shots"; Pt reports that she is a NIIDM that takes Metformin twice daily,  She states she took her morning dose but has not taken evening dose; per EMS that pt c/o increase blood sugar after ETOH; N/V en route to ED; EMS reports blood sugar greater than 500.

## 2012-07-31 DIAGNOSIS — R739 Hyperglycemia, unspecified: Secondary | ICD-10-CM | POA: Diagnosis present

## 2012-07-31 DIAGNOSIS — F1010 Alcohol abuse, uncomplicated: Secondary | ICD-10-CM

## 2012-07-31 DIAGNOSIS — E872 Acidosis: Secondary | ICD-10-CM | POA: Diagnosis present

## 2012-07-31 DIAGNOSIS — E119 Type 2 diabetes mellitus without complications: Secondary | ICD-10-CM

## 2012-07-31 DIAGNOSIS — R748 Abnormal levels of other serum enzymes: Secondary | ICD-10-CM | POA: Diagnosis present

## 2012-07-31 DIAGNOSIS — E876 Hypokalemia: Secondary | ICD-10-CM | POA: Diagnosis present

## 2012-07-31 DIAGNOSIS — R7309 Other abnormal glucose: Secondary | ICD-10-CM

## 2012-07-31 DIAGNOSIS — F10929 Alcohol use, unspecified with intoxication, unspecified: Secondary | ICD-10-CM | POA: Diagnosis present

## 2012-07-31 LAB — URINE MICROSCOPIC-ADD ON

## 2012-07-31 LAB — BASIC METABOLIC PANEL
CO2: 23 mEq/L (ref 19–32)
Calcium: 8.8 mg/dL (ref 8.4–10.5)
Calcium: 8.8 mg/dL (ref 8.4–10.5)
Creatinine, Ser: 0.8 mg/dL (ref 0.50–1.10)
Creatinine, Ser: 0.75 mg/dL (ref 0.50–1.10)
GFR calc Af Amer: >90 mL/min (ref >90)
GFR calc non Af Amer: >90 mL/min (ref >90)
Sodium: 142 mEq/L (ref 135–145)

## 2012-07-31 LAB — CBC
MCH: 31.7 pg (ref 26.0–34.0)
MCHC: 36.6 g/dL — ABNORMAL HIGH (ref 30.0–36.0)
MCV: 86.6 fL (ref 78.0–100.0)
Platelets: 251 10*3/uL (ref 150–400)
RBC: 4.1 MIL/uL (ref 3.87–5.11)
RDW: 12.2 % (ref 11.5–15.5)

## 2012-07-31 LAB — COMPREHENSIVE METABOLIC PANEL
AST: 38 U/L — ABNORMAL HIGH (ref 0–37)
Albumin: 4.2 g/dL (ref 3.5–5.2)
Calcium: 9.5 mg/dL (ref 8.4–10.5)
Creatinine, Ser: 0.89 mg/dL (ref 0.50–1.10)
GFR calc non Af Amer: 87 mL/min — ABNORMAL LOW (ref >90)
Total Protein: 7.6 g/dL (ref 6.0–8.3)

## 2012-07-31 LAB — URINALYSIS, ROUTINE W REFLEX MICROSCOPIC
Glucose, UA: >1000 mg/dL — AB
Leukocytes, UA: NEGATIVE
Protein, ur: NEGATIVE mg/dL
Specific Gravity, Urine: 1.025 (ref 1.005–1.030)
pH: 5.5 (ref 5.0–8.0)

## 2012-07-31 LAB — ETHANOL: Alcohol, Ethyl (B): <11 mg/dL (ref 0–11)

## 2012-07-31 LAB — GLUCOSE, CAPILLARY
Glucose-Capillary: 142 mg/dL — ABNORMAL HIGH (ref 70–99)
Glucose-Capillary: 188 mg/dL — ABNORMAL HIGH (ref 70–99)
Glucose-Capillary: 350 mg/dL — ABNORMAL HIGH (ref 70–99)
Glucose-Capillary: 442 mg/dL — ABNORMAL HIGH (ref 70–99)

## 2012-07-31 LAB — POCT PREGNANCY, URINE: Preg Test, Ur: NEGATIVE

## 2012-07-31 LAB — CBC WITH DIFFERENTIAL/PLATELET
Basophils Absolute: 0 10*3/uL (ref 0.0–0.1)
Basophils Relative: 0 % (ref 0–1)
Eosinophils Relative: 0 % (ref 0–5)
HCT: 40.5 % (ref 36.0–46.0)
MCHC: 37 g/dL — ABNORMAL HIGH (ref 30.0–36.0)
MCV: 86 fL (ref 78.0–100.0)
Monocytes Absolute: 0.4 10*3/uL (ref 0.1–1.0)
Platelets: 227 10*3/uL (ref 150–400)
RDW: 12 % (ref 11.5–15.5)

## 2012-07-31 MED ORDER — ONDANSETRON HCL 4 MG/2ML IJ SOLN
4.0000 mg | Freq: Once | INTRAMUSCULAR | Status: AC
  Administered 2012-07-31: 4 mg via INTRAVENOUS
  Filled 2012-07-31: qty 2

## 2012-07-31 MED ORDER — POTASSIUM CHLORIDE IN NACL 40-0.9 MEQ/L-% IV SOLN
INTRAVENOUS | Status: DC
  Administered 2012-07-31: 12:00:00 via INTRAVENOUS
  Filled 2012-07-31 (×3): qty 1000

## 2012-07-31 MED ORDER — POTASSIUM CHLORIDE CRYS ER 20 MEQ PO TBCR
40.0000 meq | EXTENDED_RELEASE_TABLET | Freq: Once | ORAL | Status: AC
  Administered 2012-07-31: 40 meq via ORAL
  Filled 2012-07-31 (×2): qty 2

## 2012-07-31 MED ORDER — SODIUM CHLORIDE 0.9 % IV SOLN: INTRAVENOUS | Status: DC

## 2012-07-31 MED ORDER — ACETAMINOPHEN 325 MG PO TABS: 650.0000 mg | ORAL_TABLET | Freq: Four times a day (QID) | ORAL | Status: DC | PRN

## 2012-07-31 MED ORDER — ONDANSETRON HCL 4 MG/2ML IJ SOLN
4.0000 mg | Freq: Four times a day (QID) | INTRAMUSCULAR | Status: AC | PRN
  Administered 2012-07-31: 4 mg via INTRAVENOUS
  Filled 2012-07-31: qty 2

## 2012-07-31 MED ORDER — SODIUM CHLORIDE 0.9 % IV SOLN
INTRAVENOUS | Status: AC
  Administered 2012-07-31: 08:00:00 via INTRAVENOUS

## 2012-07-31 MED ORDER — INSULIN ASPART 100 UNIT/ML ~~LOC~~ SOLN
10.0000 [IU] | SUBCUTANEOUS | Status: AC
  Administered 2012-07-31: 10 [IU] via SUBCUTANEOUS
  Filled 2012-07-31: qty 1

## 2012-07-31 MED ORDER — PNEUMOCOCCAL VAC POLYVALENT 25 MCG/0.5ML IJ INJ
0.5000 mL | INJECTION | INTRAMUSCULAR | Status: AC
  Administered 2012-08-01: 0.5 mL via INTRAMUSCULAR
  Filled 2012-07-31: qty 0.5

## 2012-07-31 MED ORDER — INSULIN ASPART 100 UNIT/ML ~~LOC~~ SOLN
0.0000 [IU] | Freq: Three times a day (TID) | SUBCUTANEOUS | Status: DC
  Administered 2012-07-31: 1 [IU] via SUBCUTANEOUS
  Administered 2012-08-01: 3 [IU] via SUBCUTANEOUS

## 2012-07-31 MED ORDER — ENOXAPARIN SODIUM 40 MG/0.4ML ~~LOC~~ SOLN
40.0000 mg | SUBCUTANEOUS | Status: DC
  Administered 2012-07-31: 40 mg via SUBCUTANEOUS
  Filled 2012-07-31 (×2): qty 0.4

## 2012-07-31 MED ORDER — INSULIN ASPART 100 UNIT/ML ~~LOC~~ SOLN: 0.0000 [IU] | Freq: Every day | SUBCUTANEOUS | Status: DC

## 2012-07-31 MED ORDER — INFLUENZA VIRUS VACC SPLIT PF IM SUSP
0.5000 mL | INTRAMUSCULAR | Status: AC
  Administered 2012-08-01: 0.5 mL via INTRAMUSCULAR
  Filled 2012-07-31: qty 0.5

## 2012-07-31 MED ORDER — ACETAMINOPHEN 650 MG RE SUPP: 650.0000 mg | Freq: Four times a day (QID) | RECTAL | Status: DC | PRN

## 2012-07-31 MED ORDER — SODIUM CHLORIDE 0.9 % IV BOLUS (SEPSIS)
1000.0000 mL | Freq: Once | INTRAVENOUS | Status: AC
  Administered 2012-07-31: 1000 mL via INTRAVENOUS

## 2012-07-31 MED ORDER — PROMETHAZINE HCL 25 MG/ML IJ SOLN
25.0000 mg | INTRAMUSCULAR | Status: DC | PRN
  Filled 2012-07-31: qty 1

## 2012-07-31 MED ORDER — INSULIN ASPART 100 UNIT/ML ~~LOC~~ SOLN
0.0000 [IU] | SUBCUTANEOUS | Status: DC
  Administered 2012-07-31: 1 [IU] via SUBCUTANEOUS

## 2012-07-31 NOTE — ED Notes (Signed)
CBG 350 

## 2012-07-31 NOTE — ED Notes (Signed)
POCT Pregnancy =  Negative (-) 

## 2012-07-31 NOTE — Progress Notes (Signed)
Patient vomited shortly after taking a few bites of her dinner.  Administered anti-nausea medication.

## 2012-07-31 NOTE — ED Notes (Signed)
CBG 442. 

## 2012-07-31 NOTE — ED Notes (Signed)
Attempted to call report to [address], receiving RN didn't answer, will call back in 10 min

## 2012-07-31 NOTE — ED Notes (Signed)
Admission MD in room 

## 2012-07-31 NOTE — ED Notes (Signed)
Report has been called, will transport pt when Admisson MD finishes

## 2012-07-31 NOTE — H&P (Signed)
[facility] History and Physical  Kayla Erickson NGE:[id number] DOB: 1981-05-29 DOA: 07/30/2012  Referring physician: Gerhard Munch PCP: Carrolyn Meiers, MD, [facility]  Specialists: None  Chief Complaint: nausea vomiting  HPI: Kayla Erickson is a 31 y.o. female  Past medical history of diabetes mellitus2 who has been previously admitted for DKA came in after a night of excessive drinking with nausea and vomiting and found to have sugars in the emergency room her 400. Her initial anion gap was 19. There was a suspicion that this was not DKA but may be related to her excessive alcohol use. Patient was started on IV fluids but did not receive any insulin in the emergency room. Subsequent set of labs 5 hours later noted Had come down to 15 and sugars had come down to the 300s once her nausea was better controlled. Hospitals were called for further evaluation and admission  Review of Systems: when I saw the patient in the emergency room, she was doing okay. She states she's feeling will do better. She done any headaches, vision changes, dysphasia, chest pain, palpitations, shortness of breath, wheeze, cough, pain, hematuria, dysuria, positive, diarrhea, focal extremity numbness weakness or pain. Her nausea is better. She just feeling very fatigued. Review systems otherwise negative.  Past Medical History  Diagnosis Date  . Allergic rhinitis   . Diabetes mellitus type II   . Hypertension    History reviewed. No pertinent past surgical history. Social History:  reports that she has never smoked. She does not have any smokeless tobacco history on file. She reports that she occasionally drinks and drink too much last night, she does states that she does not use illicit drugs. Patient lives at home with mom. She is able to participate in activities of daily living  No Known Allergies  She does not think any medical problems run in her family  Prior to Admission medications    Medication Sig Start Date End Date Taking? Authorizing Provider  acetaminophen (TYLENOL) 325 MG tablet Take 325 mg by mouth every 6 (six) hours as needed.   Yes Historical Provider, MD  lisinopril (PRINIVIL,ZESTRIL) 5 MG tablet Take 1 tablet (5 mg total) by mouth daily. 12/28/11  Yes Mathis Dad, MD  metFORMIN (GLUCOPHAGE) 1000 MG tablet TAKE ONE TABLET TWICE DAILY WITH MEALS 01/27/12  Yes Mathis Dad, MD   Physical Exam: Filed Vitals:   07/31/12 0445 07/31/12 0630 07/31/12 0729 07/31/12 0900  BP:   142/79 126/75  Pulse: 123 115 138 130  Temp:   99.1 F (37.3 C) 98.5 F (36.9 C)  TempSrc:   Oral Oral  Resp: 21 20 19 18   Height:    5\' 3"  (1.6 m)  Weight:    80.151 kg (176 lb 11.2 oz)  SpO2: 95% 96% 100% 99%     General:  Patient feeling okay., Fatigue, looks about stated age alert and oriented x3  Eyes: sclera nonicteric, extraocular muscles are intact  ENT: normocephalic and atraumatic, mucous members are dry  Neck: no carotid bruits  Cardiovascular: regular rhythm, mild tachycardia  Respiratory: clear auscultation bilaterally  Abdomen: soft, nontender, nondistended, positive bowel sounds  Skin: next skin breaks, tears or lesions  Musculoskeletal: no clubbing or cyanosis or edema  Psychiatric: patient is appropriate, may have some mild acute alcohol intoxication, no evidence of psychoses  Neurologic: no focal deficits  Labs on Admission:  Basic Metabolic Panel:  Lab 07/31/12 4010 07/31/12 0010  NA 140 134*  K 3.2*  3.1*  CL 102 92*  CO2 23 23  GLUCOSE 330* 450*  BUN 13 12  CREATININE 0.80 0.89  CALCIUM 8.8 9.5  MG -- --  PHOS -- --   Liver Function Tests:  Lab 07/31/12 0010  AST 38*  ALT 39*  ALKPHOS 114  BILITOT 0.4  PROT 7.6  ALBUMIN 4.2    Lab 07/31/12 0010  LIPASE 68*  AMYLASE --   CBC:  Lab 07/31/12 0010  WBC 15.6*  NEUTROABS 13.7*  HGB 15.0  HCT 40.5  MCV 86.0  PLT 227    CBG:  Lab 07/31/12 0221 07/30/12 2355  GLUCAP 350*  442*     Assessment/Plan Principal Problem:  *Hyperglycemia: Doubt DKA and possibly just simple elevated blood sugars from heavy drinking. Recheck sugars and sliding scale. I gave her 10 units of NovoLog I first saw her in the emergency room.  Active Problems:  DIABETES MELLITUS, TYPE II: See above. Patient was on metformin upon discharge Hypokalemia: Replace. Metabolic acidosis: Feel this more likely to drinking. Elevated lipase level perhaps a minimal acute pancreatitis from heavy drinking. Recheck level.  HYPERTENSION: Stable. I am lisinopril. Resume when patient is able take by mouth.  Alcohol intoxication: Recheck level. Continue IV fluids   Code Status: full code Family Communication: discussed with patient and mom at bedside Disposition Plan: we'll follow sugars and possibly could be managed at the later today. It is potentially possible she could go home as early as this evening.  Time spent: 40 minutes  Hollice Espy [facility] Pager [phone]  If 7PM-7AM, please contact night-coverage [email or web address] Password [email or web address] 07/31/2012, 9:59 AM

## 2012-07-31 NOTE — ED Notes (Signed)
Dr. Jeraldine Loots in room explaining to patient why she is getting admitted.

## 2012-07-31 NOTE — ED Provider Notes (Signed)
History     CSN: [id number]  Arrival date & time 07/30/12  2339   First MD Initiated Contact with Patient 07/30/12 2350      Chief Complaint  Patient presents with  . Hyperglycemia  . Alcohol Intoxication    (Consider location/radiation/quality/duration/timing/severity/associated sxs/prior treatment) HPI The patient presents with concerns of nausea, hyperglycemia.  The patient is a non-insulin-dependent diabetic.  She states her sugar morning dose.  In the hours prior to presentation the patient had a significant amount of alcohol.  She notes that she subsequently developed significant amounts of nausea with intractable emesis.  She denies, disorientation, or any pain.  She does note that her sugar was greater than 500 prior to coming to the emergency department. She states that she has been in the ICU for hyperglycemia in the past, though this was years ago. A colleague who presents to the patient states that the patient was in her usual state of health until this evening.  Past Medical History  Diagnosis Date  . Allergic rhinitis   . Diabetes mellitus type II   . Hypertension     History reviewed. No pertinent past surgical history.  No family history on file.  History  Substance Use Topics  . Smoking status: Never Smoker   . Smokeless tobacco: Not on file  . Alcohol Use: No    OB History    Grav Para Term Preterm Abortions TAB SAB Ect Mult Living                  Review of Systems  Unable to perform ROS: Mental status change    Allergies  Review of patient's allergies indicates no known allergies.  Home Medications   Current Outpatient Rx  Name Route Sig Dispense Refill  . ACETAMINOPHEN 325 MG PO TABS Oral Take 325 mg by mouth every 6 (six) hours as needed.    Marland Kitchen LISINOPRIL 5 MG PO TABS Oral Take 1 tablet (5 mg total) by mouth daily. 90 tablet 3  . METFORMIN HCL 1000 MG PO TABS  TAKE ONE TABLET TWICE DAILY WITH MEALS 60 tablet 6    BP 118/86  Pulse 125   Temp 97.5 F (36.4 C) (Oral)  Resp 25  SpO2 94%  LMP 07/13/2012  Physical Exam  Constitutional: No distress.       Clinically intoxicated young female, diaphoretic, nauseous with visible gagging, emesis.  HENT:  Head: Normocephalic and atraumatic.  Eyes: Conjunctivae normal and EOM are normal. Pupils are equal, round, and reactive to light.  Neck: No tracheal deviation present.  Cardiovascular: Regular rhythm.  Tachycardia present.   Pulmonary/Chest: Effort normal. No stridor. No respiratory distress.  Abdominal: She exhibits no distension.  Musculoskeletal:       No deformity  Neurological: She is alert. No cranial nerve deficit. She exhibits normal muscle tone. Coordination normal.       Patient is intoxicated, but is moving all extremity spontaneously.  She does speak, though she does not cooperate with full neurologic exam.  Skin: Skin is warm. She is diaphoretic.  Psychiatric:       Intoxicated    ED Course  Procedures (including critical care time)  Labs Reviewed  GLUCOSE, CAPILLARY - Abnormal; Notable for the following:    Glucose-Capillary 442 (*)     All other components within normal limits  CBC WITH DIFFERENTIAL  COMPREHENSIVE METABOLIC PANEL  LIPASE, BLOOD  URINALYSIS, ROUTINE W REFLEX MICROSCOPIC   No results found.   No diagnosis  found. Cardiac 121 sinus tach abnormal Pulse ox 99% room air normal  5:49 AM  Patient w heaves.  Zofran given.  6:26 AM Patient remained tachycardic, though she is speaking clearly.  She notes that she continues to have significant nausea. MDM  This young female presents with nausea, vomiting.  Given the patient's history of non-insulin-dependent diabetes, her use of metformin there is suspicion of DKA versus alcohol-related dehydration.  On initial exam the patient is tachycardic, uncomfortable appearing, but the with a soft abdomen.  The patient's labs are notable for oh daily, hyperglycemia and ketonuria.  Following ED  resuscitation with IV fluids, antiemetics, the patient remained tachycardic, though she was subjectively improved.  She did have persistent nausea in spite of multiple interventions.  Given the patient's continued abnormal vital signs she was admitted for further evaluation and management.  With her hyper glycemia she did not receive dextrose fluids, but was resuscitated with normal saline.  Gerhard Munch, MD 07/31/12 [phone]

## 2012-08-01 DIAGNOSIS — R112 Nausea with vomiting, unspecified: Secondary | ICD-10-CM

## 2012-08-01 LAB — GLUCOSE, CAPILLARY: Glucose-Capillary: 213 mg/dL — ABNORMAL HIGH (ref 70–99)

## 2012-08-01 LAB — LIPASE, BLOOD: Lipase: 32 U/L (ref 11–59)

## 2012-08-01 NOTE — Progress Notes (Signed)
Talked to patient about DCP; Patient goes to the Internal Medicine Clinic at [facility] and is very happy with her care there; prescriptions are filled at [facility] on [address]. CM asked patient if she can afford her prescriptions that her MD might prescribe for her today, patient stated yes. (Patient does qualify for the Indigent funds if needed).  Independent prior to admission. Abelino Derrick RN, [employer]

## 2012-08-01 NOTE — Discharge Summary (Signed)
Physician Discharge Summary  Rhyder Bratz AVW:[id number] DOB: [birth year] DOA: 07/30/2012  PCP: Carrolyn Meiers, MD, internal medicine clinic at [facility]  Admit date: 07/30/2012 Discharge date: 08/01/2012  Recommendations for Outpatient Follow-up:  1. Patient will follow up with her primary care physician  at the internal medicine clinic  Discharge Diagnoses:  Principal Problem:  *Hyperglycemia Active Problems:  DIABETES MELLITUS, TYPE II  HYPERTENSION  Alcohol intoxication  Hypokalemia  Elevated lipase  Metabolic acidosis   Discharge Condition: Improved, being discharged home  Diet recommendation: Carb modified  Filed Weights   07/31/12 0900  Weight: 80.151 kg (176 lb 11.2 oz)    History of present illness:  31 year old white female with past medical history of diabetes mellitus on metformin who presented to the emergency room on Saturday morning after a night of heavy alcohol intoxication and secondary nausea vomiting. Emergency room she was noted to have a blood sugar of 450 with an anion gap of 19. It was less likely suspect that she was in DKA and more likely suspected that this was secondary to alcohol intoxication. Her gap improved and her sugars came down several hours later without any insulin, and just IV fluids. She was admitted to the hospital service for further evaluation she was also incidentally noted to have an elevated lipase level of 68  Hospital Course:  Diabetes mellitus type 2 with hyperglycemia: Patient was a mild acidosis, but with IV fluids in size her insulin her numbers corrected by later in the afternoon. She attempted didn't, but had one episode of abdominal pain with nausea and vomiting. She was monitored overnight and the following morning, she is able to tolerate by mouth. Her lipase level normalized. Her white count which was elevated on admission, likely from stress margination, continued to improve as well. She no signs of any clinical infection. No  fevers. No signs of any renal dysfunction. She is felt medically stable and will be discharged home.  Hypertension: Blood pressure stable. Patient resume lisinopril Allergic rhinitis: Stable. Nonmedical issue during this hospitalization.  Procedures:  None  Consultations:  None  Discharge Exam: Filed Vitals:   07/31/12 0900 07/31/12 1328 07/31/12 2053 08/01/12 0540  BP: 126/75 114/58 128/76 129/81  Pulse: 130 118 107 102  Temp: 98.5 F (36.9 C) 98.4 F (36.9 C) 98.8 F (37.1 C) 98.3 F (36.8 C)  TempSrc: Oral Oral Oral Oral  Resp: 18 18 18 18   Height: 5\' 3"  (1.6 m)     Weight: 80.151 kg (176 lb 11.2 oz)     SpO2: 99% 99% 99% 99%    General: Alert and oriented x3, no acute distress, looks younger than stated age Cardiovascular: Regular rate and rhythm, S1-S2 Respiratory: Clear to auscultation bilaterally Abdomen: Soft, nontender, nondistended, positive bowel sounds Extremities: No clubbing or cyanosis or edema  Discharge Instructions  Discharge Orders    Future Orders Please Complete By Expires   Diet Carb Modified      Increase activity slowly          Medication List     As of 08/01/2012 10:35 AM    TAKE these medications         acetaminophen 325 MG tablet   Commonly known as: TYLENOL   Take 325 mg by mouth every 6 (six) hours as needed.      lisinopril 5 MG tablet   Commonly known as: PRINIVIL,ZESTRIL   Take 1 tablet (5 mg total) by mouth daily.      metFORMIN 1000 MG  tablet   Commonly known as: GLUCOPHAGE   TAKE ONE TABLET TWICE DAILY WITH MEALS          The results of significant diagnostics from this hospitalization (including imaging, microbiology, ancillary and laboratory) are listed below for reference.    Significant Diagnostic Studies: No results found.  Labs: Basic Metabolic Panel:  Lab 07/31/12 4782 07/31/12 0500 07/31/12 0010  NA 142 140 134*  K 2.9* 3.2* 3.1*  CL 102 102 92*  CO2 26 23 23   GLUCOSE 136* 330* 450*  BUN 12  13 12   CREATININE 0.75 0.80 0.89  CALCIUM 8.8 8.8 9.5  MG -- -- --  PHOS -- -- --   Liver Function Tests:  Lab 07/31/12 0010  AST 38*  ALT 39*  ALKPHOS 114  BILITOT 0.4  PROT 7.6  ALBUMIN 4.2    Lab 08/01/12 0400 07/31/12 0010  LIPASE 32 68*  AMYLASE -- --   CBC:  Lab 07/31/12 1145 07/31/12 0010  WBC 12.5* 15.6*  NEUTROABS -- 13.7*  HGB 13.0 15.0  HCT 35.5* 40.5  MCV 86.6 86.0  PLT 251 227   CBG:  Lab 08/01/12 0752 07/31/12 2218 07/31/12 1607 07/31/12 1135 07/31/12 0221  GLUCAP 213* 188* 142* 125* 350*    Time coordinating discharge: 20 minutes  Signed:  Hollice Espy  [facility] 08/01/2012, 10:35 AM

## 2012-10-13 ENCOUNTER — Other Ambulatory Visit: Payer: Self-pay | Admitting: Internal Medicine

## 2012-10-14 NOTE — Telephone Encounter (Signed)
Patient has not been seen since 12/2011.  Will give this month RF but she will need to be seen in our clinic to repeat HbA1c before we can refill her meds

## 2012-10-17 NOTE — Telephone Encounter (Signed)
Message sent to front desk to schedule pt an appt. 

## 2012-11-15 ENCOUNTER — Other Ambulatory Visit: Payer: Self-pay | Admitting: Internal Medicine

## 2012-12-06 ENCOUNTER — Ambulatory Visit (INDEPENDENT_AMBULATORY_CARE_PROVIDER_SITE_OTHER): Payer: PRIVATE HEALTH INSURANCE | Admitting: Internal Medicine

## 2012-12-06 ENCOUNTER — Encounter: Payer: Self-pay | Admitting: Internal Medicine

## 2012-12-06 VITALS — BP 140/89 | HR 123 | Temp 97.7°F | Ht 62.0 in | Wt 177.5 lb

## 2012-12-06 DIAGNOSIS — Z79899 Other long term (current) drug therapy: Secondary | ICD-10-CM

## 2012-12-06 DIAGNOSIS — I498 Other specified cardiac arrhythmias: Secondary | ICD-10-CM

## 2012-12-06 DIAGNOSIS — E119 Type 2 diabetes mellitus without complications: Secondary | ICD-10-CM

## 2012-12-06 DIAGNOSIS — I10 Essential (primary) hypertension: Secondary | ICD-10-CM

## 2012-12-06 LAB — LIPID PANEL
Cholesterol: 142 mg/dL (ref 0–200)
LDL Cholesterol: 64 mg/dL (ref 0–99)
Triglycerides: 103 mg/dL (ref <150)

## 2012-12-06 LAB — GLUCOSE, CAPILLARY: Glucose-Capillary: 216 mg/dL — ABNORMAL HIGH (ref 70–99)

## 2012-12-06 MED ORDER — ATENOLOL 50 MG PO TABS: 50.0000 mg | ORAL_TABLET | Freq: Every day | ORAL | Status: DC

## 2012-12-06 MED ORDER — LISINOPRIL 5 MG PO TABS: 5.0000 mg | ORAL_TABLET | Freq: Every day | ORAL | Status: DC

## 2012-12-06 MED ORDER — METFORMIN HCL 1000 MG PO TABS: ORAL_TABLET | ORAL | Status: DC

## 2012-12-06 MED ORDER — GLIPIZIDE 5 MG PO TABS: 5.0000 mg | ORAL_TABLET | Freq: Two times a day (BID) | ORAL | Status: DC

## 2012-12-06 NOTE — Assessment & Plan Note (Signed)
  BP Readings from Last 3 Encounters:  12/06/12 140/89  08/01/12 129/81  12/28/11 146/82    Lab Results  Component Value Date   NA 142 07/31/2012   K 2.9* 07/31/2012   CREATININE 0.75 07/31/2012    Assessment:  Blood pressure control:  no  Progress toward BP goal:   no  Comments: Patient did not to take pressure medication today  Plan:  Medications: Continue lisinopril 5 mg daily, start atenolol 50 mg daily given her sinus tachycardia   Educational resources provided: brochure;handout  Self management tools provided:    Other plans:  recheck blood pressure in 4 weeks

## 2012-12-06 NOTE — Progress Notes (Signed)
Patient ID: Kayla Erickson, female   DOB: January 29, 1981, 32 y.o.   MRN: [id number] History of present illness: Kayla Erickson is a 32 yo woman with past medical history of diabetes, HTN,  presents today for routine followup. No blurry vision, numbness or tingling. She is doing well. She also weight with exercise and eating small portions.  Has not been drinking alcohol, the alcohol intoxication in the colon was a one time thing. No other complaints today .  She did not bring her meter today. He states that her fasting blood glucose between 180-200. She checks blood sugar of 2 times daily. She forgot to bring her glucose meter today.  She was previously on glyburide it was taken off because of hypoglycemia. Would like to be on a second agent to control her blood sugar better.  Eye exam: due Pap smear: deferred, still a virgin Flu vaccine: done 07/2012 Pneumovax: done 07/2012  Review of system: As per history of present illness  Physical examination: General: alert, well-developed, and cooperative to examination.  Lungs: normal respiratory effort, no accessory muscle use, normal breath sounds, no crackles, and no wheezes. Heart: tachycardic, regular rhythm, no murmur, no gallop, and no rub.  Abdomen: soft, non-tender, normal bowel sounds, no distention, no guarding, no rebound tenderness, no hepatomegaly, and no splenomegaly.  Neurologic: nonfocal Skin: turgor normal and no rashes. Feet exam: unremarkable. +2DP pulses. No ulcers noted   Psych: appropriate

## 2012-12-06 NOTE — Assessment & Plan Note (Addendum)
Lab Results  Component Value Date   HGBA1C 9.1 12/06/2012   HGBA1C 7.4 12/28/2011   HGBA1C 5.9 08/05/2010     Assessment:  Diabetes control:   not well-controlled  Progress toward A1C goal:    no  Comments: Vision was previously on glyburide but was taken off due to hypoglycemia. Patient has been trying to lose weight as well as eating properly.  Plan:  Medications:  Continue metformin 1000 mg by mouth twice a day. Will also restart glipizide 5 mg by mouth twice a day. Patient was instructed to call the office to inform me if she becomes hypoglycemic with the new glipizide. Also continue ACE inhibitor  Home glucose monitoring:   Frequency:   2-3 times   Timing:   before meals  Instruction/counseling given: yes  Educational resources provided: brochure;handout  Self management tools provided: copy of home glucose meter download  Other plans: Patient will need ophthalmology appointment for screening retinopathy   Patient will need a followup with me in 1-2 months to reassess  Will check a BMP today given that she had some electrolyte abnormalities during her hospitalization in October  Will also check lipid panel

## 2012-12-06 NOTE — Assessment & Plan Note (Signed)
Again this is a chronic issue for patient. She denies any chest pain or shortness of breath. She does report severe anxiety when she comes to the doctor's office. I will start her on a beta blocker today. -Start atenolol 50 mg by mouth daily -Recheck blood pressure and pulse in about 4 weeks

## 2012-12-06 NOTE — Patient Instructions (Addendum)
General Instructions: Continue taking metformin 1000 mg by mouth twice a day Continue taking lisinopril 5 mg once daily Start Glipizide 5mg  one tablet twice daily Start atenolol 50mg  one tablet daily Continue to diet and exercise Follow up in 1-2 months  Self Care Goals & Plans:  Self Care Goal 12/06/2012  Manage my medications take my medicines as prescribed; bring my medications to every visit; refill my medications on time; follow the sick day instructions if I am sick  Monitor my health keep track of my blood glucose; bring my glucose meter and log to each visit; keep track of my weight; check my feet daily  Eat healthy foods eat more vegetables; eat fruit for snacks and desserts; eat smaller portions; drink diet soda or water instead of juice or soda  Be physically active take a walk every day; find an activity I enjoy

## 2012-12-07 LAB — BASIC METABOLIC PANEL WITH GFR
BUN: 14 mg/dL (ref 6–23)
Chloride: 102 mEq/L (ref 96–112)
GFR, Est Non African American: >89 mL/min
Glucose, Bld: 226 mg/dL — ABNORMAL HIGH (ref 70–99)
Potassium: 4.1 mEq/L (ref 3.5–5.3)

## 2013-01-12 ENCOUNTER — Ambulatory Visit (INDEPENDENT_AMBULATORY_CARE_PROVIDER_SITE_OTHER): Payer: PRIVATE HEALTH INSURANCE | Admitting: Dietician

## 2013-01-12 DIAGNOSIS — E119 Type 2 diabetes mellitus without complications: Secondary | ICD-10-CM

## 2013-01-12 LAB — HM DIABETES EYE EXAM

## 2013-02-13 ENCOUNTER — Other Ambulatory Visit: Payer: Self-pay | Admitting: Internal Medicine

## 2013-02-15 NOTE — Addendum Note (Signed)
Addended by: Bufford Spikes on: 02/15/2013 02:39 PM   Modules accepted: Orders

## 2013-02-24 ENCOUNTER — Encounter: Payer: Self-pay | Admitting: Internal Medicine

## 2013-02-28 ENCOUNTER — Encounter: Payer: Self-pay | Admitting: Dietician

## 2013-03-28 ENCOUNTER — Encounter: Payer: Self-pay | Admitting: Internal Medicine

## 2013-03-28 DIAGNOSIS — E11319 Type 2 diabetes mellitus with unspecified diabetic retinopathy without macular edema: Secondary | ICD-10-CM | POA: Insufficient documentation

## 2013-04-27 ENCOUNTER — Other Ambulatory Visit: Payer: Self-pay

## 2013-07-26 ENCOUNTER — Other Ambulatory Visit: Payer: Self-pay | Admitting: *Deleted

## 2013-07-27 MED ORDER — METFORMIN HCL 1000 MG PO TABS: ORAL_TABLET | ORAL | Status: DC

## 2013-07-28 ENCOUNTER — Other Ambulatory Visit: Payer: Self-pay | Admitting: *Deleted

## 2013-07-28 MED ORDER — GLIPIZIDE 5 MG PO TABS: 5.0000 mg | ORAL_TABLET | Freq: Two times a day (BID) | ORAL | Status: DC

## 2013-07-28 NOTE — Telephone Encounter (Signed)
Rx faxed in to pharmacy. Stanton Kidney Valarie Farace RN 07/28/13 4:35PM

## 2013-07-31 NOTE — Telephone Encounter (Signed)
Called to pharm 

## 2013-08-24 ENCOUNTER — Other Ambulatory Visit: Payer: Self-pay

## 2013-10-24 ENCOUNTER — Encounter: Payer: Self-pay | Admitting: Internal Medicine

## 2013-11-24 ENCOUNTER — Other Ambulatory Visit: Payer: Self-pay | Admitting: *Deleted

## 2013-11-24 MED ORDER — LISINOPRIL 5 MG PO TABS: 5.0000 mg | ORAL_TABLET | Freq: Every day | ORAL | Status: DC

## 2013-11-27 NOTE — Telephone Encounter (Signed)
Called to pharm 

## 2014-01-29 ENCOUNTER — Other Ambulatory Visit: Payer: Self-pay | Admitting: Internal Medicine

## 2014-02-02 ENCOUNTER — Other Ambulatory Visit: Payer: Self-pay | Admitting: *Deleted

## 2014-02-03 MED ORDER — ATENOLOL 50 MG PO TABS: 50.0000 mg | ORAL_TABLET | Freq: Every day | ORAL | Status: DC

## 2014-03-15 ENCOUNTER — Other Ambulatory Visit: Payer: Self-pay | Admitting: Internal Medicine

## 2014-07-27 ENCOUNTER — Encounter: Payer: Self-pay | Admitting: *Deleted

## 2014-08-03 ENCOUNTER — Other Ambulatory Visit: Payer: Self-pay

## 2014-09-14 ENCOUNTER — Other Ambulatory Visit: Payer: Self-pay | Admitting: Internal Medicine

## 2014-12-18 ENCOUNTER — Other Ambulatory Visit: Payer: Self-pay | Admitting: Internal Medicine

## 2015-04-15 ENCOUNTER — Other Ambulatory Visit: Payer: Self-pay

## 2015-09-30 ENCOUNTER — Emergency Department (HOSPITAL_COMMUNITY): Payer: No Typology Code available for payment source

## 2015-09-30 ENCOUNTER — Encounter (HOSPITAL_COMMUNITY): Payer: Self-pay | Admitting: Nurse Practitioner

## 2015-09-30 ENCOUNTER — Emergency Department (HOSPITAL_COMMUNITY)
Admission: EM | Admit: 2015-09-30 | Discharge: 2015-09-30 | Disposition: A | Discharge status: Home / Self Care | Payer: No Typology Code available for payment source | Attending: Emergency Medicine | Admitting: Emergency Medicine

## 2015-09-30 DIAGNOSIS — Y92410 Unspecified street and highway as the place of occurrence of the external cause: Secondary | ICD-10-CM | POA: Diagnosis not present

## 2015-09-30 DIAGNOSIS — S199XXA Unspecified injury of neck, initial encounter: Secondary | ICD-10-CM | POA: Diagnosis not present

## 2015-09-30 DIAGNOSIS — S0990XA Unspecified injury of head, initial encounter: Secondary | ICD-10-CM | POA: Insufficient documentation

## 2015-09-30 DIAGNOSIS — S299XXA Unspecified injury of thorax, initial encounter: Secondary | ICD-10-CM | POA: Insufficient documentation

## 2015-09-30 DIAGNOSIS — Y9389 Activity, other specified: Secondary | ICD-10-CM | POA: Insufficient documentation

## 2015-09-30 DIAGNOSIS — E113299 Type 2 diabetes mellitus with mild nonproliferative diabetic retinopathy without macular edema, unspecified eye: Secondary | ICD-10-CM | POA: Insufficient documentation

## 2015-09-30 DIAGNOSIS — I10 Essential (primary) hypertension: Secondary | ICD-10-CM | POA: Insufficient documentation

## 2015-09-30 DIAGNOSIS — R0781 Pleurodynia: Secondary | ICD-10-CM

## 2015-09-30 DIAGNOSIS — S3992XA Unspecified injury of lower back, initial encounter: Secondary | ICD-10-CM | POA: Insufficient documentation

## 2015-09-30 DIAGNOSIS — Z79899 Other long term (current) drug therapy: Secondary | ICD-10-CM | POA: Insufficient documentation

## 2015-09-30 DIAGNOSIS — Y998 Other external cause status: Secondary | ICD-10-CM | POA: Insufficient documentation

## 2015-09-30 DIAGNOSIS — S8992XA Unspecified injury of left lower leg, initial encounter: Secondary | ICD-10-CM | POA: Insufficient documentation

## 2015-09-30 DIAGNOSIS — Z7984 Long term (current) use of oral hypoglycemic drugs: Secondary | ICD-10-CM | POA: Insufficient documentation

## 2015-09-30 DIAGNOSIS — S060X1A Concussion with loss of consciousness of 30 minutes or less, initial encounter: Secondary | ICD-10-CM

## 2015-09-30 DIAGNOSIS — M25521 Pain in right elbow: Secondary | ICD-10-CM

## 2015-09-30 DIAGNOSIS — S59901A Unspecified injury of right elbow, initial encounter: Secondary | ICD-10-CM | POA: Insufficient documentation

## 2015-09-30 DIAGNOSIS — S7012XA Contusion of left thigh, initial encounter: Secondary | ICD-10-CM | POA: Insufficient documentation

## 2015-09-30 DIAGNOSIS — R4020 Unspecified coma: Secondary | ICD-10-CM

## 2015-09-30 DIAGNOSIS — M25562 Pain in left knee: Secondary | ICD-10-CM

## 2015-09-30 MED ORDER — OXYCODONE-ACETAMINOPHEN 5-325 MG PO TABS: 2.0000 | ORAL_TABLET | ORAL | Status: DC | PRN

## 2015-09-30 MED ORDER — CYCLOBENZAPRINE HCL 10 MG PO TABS
5.0000 mg | ORAL_TABLET | Freq: Once | ORAL | Status: AC
  Administered 2015-09-30: 5 mg via ORAL
  Filled 2015-09-30: qty 1

## 2015-09-30 MED ORDER — CYCLOBENZAPRINE HCL 10 MG PO TABS: 10.0000 mg | ORAL_TABLET | Freq: Two times a day (BID) | ORAL | Status: DC | PRN

## 2015-09-30 MED ORDER — KETOROLAC TROMETHAMINE 60 MG/2ML IM SOLN
60.0000 mg | Freq: Once | INTRAMUSCULAR | Status: AC
  Administered 2015-09-30: 60 mg via INTRAMUSCULAR
  Filled 2015-09-30: qty 2

## 2015-09-30 MED ORDER — OXYCODONE-ACETAMINOPHEN 5-325 MG PO TABS
1.0000 | ORAL_TABLET | Freq: Once | ORAL | Status: AC
  Administered 2015-09-30: 1 via ORAL
  Filled 2015-09-30: qty 1

## 2015-09-30 MED ORDER — NAPROXEN 500 MG PO TABS: 500.0000 mg | ORAL_TABLET | Freq: Two times a day (BID) | ORAL | Status: DC
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
## 2017-01-15 ENCOUNTER — Emergency Department (HOSPITAL_COMMUNITY): Payer: Self-pay

## 2017-01-15 ENCOUNTER — Inpatient Hospital Stay (HOSPITAL_COMMUNITY): Payer: Self-pay

## 2017-01-15 ENCOUNTER — Encounter (HOSPITAL_COMMUNITY): Payer: Self-pay

## 2017-01-15 ENCOUNTER — Inpatient Hospital Stay (HOSPITAL_COMMUNITY)
Admission: EM | Admit: 2017-01-15 | Discharge: 2017-01-19 | DRG: 638 | Disposition: A | Discharge status: Home / Self Care | Payer: Self-pay | Attending: Nephrology | Admitting: Nephrology

## 2017-01-15 DIAGNOSIS — E873 Alkalosis: Secondary | ICD-10-CM | POA: Diagnosis present

## 2017-01-15 DIAGNOSIS — Z7984 Long term (current) use of oral hypoglycemic drugs: Secondary | ICD-10-CM

## 2017-01-15 DIAGNOSIS — E113299 Type 2 diabetes mellitus with mild nonproliferative diabetic retinopathy without macular edema, unspecified eye: Secondary | ICD-10-CM | POA: Diagnosis present

## 2017-01-15 DIAGNOSIS — E1110 Type 2 diabetes mellitus with ketoacidosis without coma: Principal | ICD-10-CM | POA: Diagnosis present

## 2017-01-15 DIAGNOSIS — I10 Essential (primary) hypertension: Secondary | ICD-10-CM | POA: Diagnosis present

## 2017-01-15 DIAGNOSIS — E876 Hypokalemia: Secondary | ICD-10-CM | POA: Diagnosis present

## 2017-01-15 DIAGNOSIS — R778 Other specified abnormalities of plasma proteins: Secondary | ICD-10-CM

## 2017-01-15 DIAGNOSIS — R7989 Other specified abnormal findings of blood chemistry: Secondary | ICD-10-CM | POA: Diagnosis present

## 2017-01-15 DIAGNOSIS — J209 Acute bronchitis, unspecified: Secondary | ICD-10-CM | POA: Diagnosis present

## 2017-01-15 DIAGNOSIS — N179 Acute kidney failure, unspecified: Secondary | ICD-10-CM | POA: Diagnosis present

## 2017-01-15 DIAGNOSIS — E878 Other disorders of electrolyte and fluid balance, not elsewhere classified: Secondary | ICD-10-CM

## 2017-01-15 DIAGNOSIS — Z79899 Other long term (current) drug therapy: Secondary | ICD-10-CM

## 2017-01-15 DIAGNOSIS — J309 Allergic rhinitis, unspecified: Secondary | ICD-10-CM | POA: Diagnosis present

## 2017-01-15 DIAGNOSIS — R000 Tachycardia, unspecified: Secondary | ICD-10-CM | POA: Diagnosis present

## 2017-01-15 HISTORY — DX: Type 2 diabetes mellitus with ketoacidosis without coma: E11.10

## 2017-01-15 LAB — BASIC METABOLIC PANEL
ANION GAP: 32 — AB (ref 5–15)
BUN: 15 mg/dL (ref 6–20)
CO2: 8 mmol/L — ABNORMAL LOW (ref 22–32)
Calcium: 9.2 mg/dL (ref 8.9–10.3)
Chloride: 95 mmol/L — ABNORMAL LOW (ref 101–111)
Creatinine, Ser: 2.04 mg/dL — ABNORMAL HIGH (ref 0.44–1.00)
GFR calc Af Amer: 36 mL/min — ABNORMAL LOW (ref >60)
GFR, EST NON AFRICAN AMERICAN: 31 mL/min — AB (ref >60)
Glucose, Bld: 426 mg/dL — ABNORMAL HIGH (ref 65–99)
POTASSIUM: 3.8 mmol/L (ref 3.5–5.1)
SODIUM: 135 mmol/L (ref 135–145)

## 2017-01-15 LAB — CBC
HEMATOCRIT: 46.3 % — AB (ref 36.0–46.0)
HEMOGLOBIN: 16.1 g/dL — AB (ref 12.0–15.0)
MCH: 32.2 pg (ref 26.0–34.0)
MCHC: 34.8 g/dL (ref 30.0–36.0)
MCV: 92.6 fL (ref 78.0–100.0)
Platelets: 283 10*3/uL (ref 150–400)
RBC: 5 MIL/uL (ref 3.87–5.11)
RDW: 12.7 % (ref 11.5–15.5)
WBC: 13 10*3/uL — AB (ref 4.0–10.5)

## 2017-01-15 LAB — I-STAT ARTERIAL BLOOD GAS, ED
Acid-base deficit: 26 mmol/L — ABNORMAL HIGH (ref 0.0–2.0)
Bicarbonate: 3.8 mmol/L — ABNORMAL LOW (ref 20.0–28.0)
O2 SAT: 96 %
PCO2 ART: 15 mmHg — AB (ref 32.0–48.0)
PO2 ART: 120 mmHg — AB (ref 83.0–108.0)
Patient temperature: 98.6
TCO2: <5 mmol/L (ref 0–100)
pH, Arterial: 7.012 — CL (ref 7.350–7.450)

## 2017-01-15 LAB — I-STAT TROPONIN, ED
Troponin i, poc: 0.01 ng/mL (ref 0.00–0.08)
Troponin i, poc: 0.46 ng/mL (ref 0.00–0.08)

## 2017-01-15 LAB — URINALYSIS, ROUTINE W REFLEX MICROSCOPIC
BACTERIA UA: NONE SEEN
BILIRUBIN URINE: NEGATIVE
Glucose, UA: >=500 mg/dL — AB
KETONES UR: 80 mg/dL — AB
Leukocytes, UA: NEGATIVE
Nitrite: NEGATIVE
Protein, ur: 30 mg/dL — AB
Specific Gravity, Urine: 1.017 (ref 1.005–1.030)
pH: 5 (ref 5.0–8.0)

## 2017-01-15 LAB — I-STAT CG4 LACTIC ACID, ED: Lactic Acid, Venous: 2.3 mmol/L (ref 0.5–1.9)

## 2017-01-15 LAB — CBG MONITORING, ED
Glucose-Capillary: 420 mg/dL — ABNORMAL HIGH (ref 65–99)
Glucose-Capillary: 425 mg/dL — ABNORMAL HIGH (ref 65–99)
Glucose-Capillary: 454 mg/dL — ABNORMAL HIGH (ref 65–99)

## 2017-01-15 LAB — PREGNANCY, URINE: PREG TEST UR: NEGATIVE

## 2017-01-15 MED ORDER — SODIUM CHLORIDE 0.9 % IV BOLUS (SEPSIS)
1000.0000 mL | Freq: Once | INTRAVENOUS | Status: AC
  Administered 2017-01-15: 1000 mL via INTRAVENOUS

## 2017-01-15 MED ORDER — SODIUM CHLORIDE 0.9 % IV SOLN
INTRAVENOUS | Status: DC
  Administered 2017-01-16: 03:00:00 via INTRAVENOUS

## 2017-01-15 MED ORDER — POTASSIUM CHLORIDE 20 MEQ/15ML (10%) PO SOLN
20.0000 meq | ORAL | Status: AC
  Administered 2017-01-16 (×2): 20 meq via ORAL
  Filled 2017-01-15 (×2): qty 15

## 2017-01-15 MED ORDER — SODIUM CHLORIDE 0.9 % IV SOLN
INTRAVENOUS | Status: DC
  Administered 2017-01-17 – 2017-01-18 (×3): via INTRAVENOUS

## 2017-01-15 MED ORDER — DEXTROSE-NACL 5-0.45 % IV SOLN
INTRAVENOUS | Status: DC
  Administered 2017-01-16 – 2017-01-17 (×4): via INTRAVENOUS

## 2017-01-15 MED ORDER — HEPARIN SODIUM (PORCINE) 5000 UNIT/ML IJ SOLN
5000.0000 [IU] | Freq: Three times a day (TID) | INTRAMUSCULAR | Status: DC
  Administered 2017-01-16 – 2017-01-17 (×4): 5000 [IU] via SUBCUTANEOUS
  Filled 2017-01-15 (×5): qty 1

## 2017-01-15 MED ORDER — DEXTROSE-NACL 5-0.45 % IV SOLN: INTRAVENOUS | Status: DC

## 2017-01-15 MED ORDER — SODIUM CHLORIDE 0.9 % IV SOLN
INTRAVENOUS | Status: DC
  Administered 2017-01-16: 6.9 [IU]/h via INTRAVENOUS
  Filled 2017-01-15: qty 2.5

## 2017-01-15 MED ORDER — ONDANSETRON HCL 4 MG/2ML IJ SOLN
4.0000 mg | Freq: Once | INTRAMUSCULAR | Status: AC
  Administered 2017-01-16: 4 mg via INTRAVENOUS
  Filled 2017-01-15: qty 2

## 2017-01-15 MED ORDER — PIPERACILLIN-TAZOBACTAM 3.375 G IVPB
3.3750 g | Freq: Once | INTRAVENOUS | Status: AC
  Administered 2017-01-15: 3.375 g via INTRAVENOUS
  Filled 2017-01-15: qty 50

## 2017-01-15 MED ORDER — SODIUM CHLORIDE 0.9 % IV SOLN
INTRAVENOUS | Status: DC
  Administered 2017-01-15: 3.7 [IU]/h via INTRAVENOUS
  Filled 2017-01-15: qty 2.5

## 2017-01-15 MED ORDER — SODIUM CHLORIDE 0.9 % IV SOLN
INTRAVENOUS | Status: DC
  Administered 2017-01-16: 01:00:00 via INTRAVENOUS

## 2017-01-15 MED ORDER — VANCOMYCIN HCL IN DEXTROSE 1-5 GM/200ML-% IV SOLN
1000.0000 mg | Freq: Once | INTRAVENOUS | Status: AC
  Administered 2017-01-15: 1000 mg via INTRAVENOUS
  Filled 2017-01-15: qty 200

## 2017-01-15 NOTE — ED Provider Notes (Addendum)
[facility]-EMERGENCY DEPT Provider Note   CSN: [id number] Arrival date & time: 01/15/17  1944     History   Chief Complaint Chief Complaint  Patient presents with  . Cough  . Shortness of Breath  . Tachycardia    HPI Kayla Erickson is a 36 y.o. female.  HPI 36 year old female history of type 2 diabetes and hypertension who presents today complaining of cough and fever that began several days ago. She reports being generally weak. She has had 2 episodes of vomiting. She takes metformin for her diabetes. She is not able take her medications today. She has no known sick contacts. She is sexually active but has never been pregnant. She is not using any birth control and states that she is sexually active only with females. Past Medical History:  Diagnosis Date  . Allergic rhinitis   . Diabetes mellitus type 2 with complications (HCC)   . Hypertension   . 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 Diabetic retinopathy (HCC) 03/28/2013  . Diabetes mellitus type II   . HYPERTENSION 03/25/2010  . SINUS TACHYCARDIA 11/24/2007  . ATTENTION DEFICIT HYPERACTIVITY DISORDER 02/01/2007    History reviewed. No pertinent surgical history.  OB History    No data available       Home Medications    Prior to Admission medications   Medication Sig Start Date End Date Taking? Authorizing Provider  atenolol (TENORMIN) 50 MG tablet Take 1 tablet (50 mg total) by mouth daily. Patient taking differently: Take 50 mg by mouth 2 (two) times daily.   01/15/17 Yes Carolan Clines, MD  guaiFENesin (MUCINEX) 600 MG 12 hr tablet Take 600 mg by mouth 2 (two) times daily as needed for cough.    Yes Historical Provider, MD  lisinopril (PRINIVIL,ZESTRIL) 20 MG tablet Take 20 mg by mouth 2 (two) times daily.   Yes Historical Provider, MD  metFORMIN (GLUCOPHAGE) 1000 MG tablet TAKE ONE TABLET by mouth TWICE DAILY  WITH MEALS  09/17/14  Yes Carolan Clines, MD    Family History History reviewed. No pertinent family history.  Social History Social History  Substance Use Topics  . Smoking status: Never Smoker  . Smokeless tobacco: Never Used  . Alcohol use Yes     Allergies   Patient has no known allergies.   Review of Systems Review of Systems  All other systems reviewed and are negative.    Physical Exam Updated Vital Signs BP (!) 159/97   Pulse (!) 159   Temp 98.9 F (37.2 C)   Resp (!) 29   LMP 12/25/2016 (Approximate)   SpO2 100%   Physical Exam  Constitutional: She is oriented to person, place, and time.  Ill-appearing female who does not appear to be in acute distress but is pale and tachycardic  HENT:  Head: Normocephalic and atraumatic.  Right Ear: External ear normal.  Left Ear: External ear normal.  Mucous membranes are dry  Eyes: Conjunctivae and EOM are normal. Pupils are equal, round, and reactive to light.  Neck: Normal range of motion. Neck supple.  Cardiovascular: Tachycardia present.   Pulmonary/Chest: Effort normal and breath sounds normal.  Abdominal: Soft. Bowel sounds are normal.  Musculoskeletal: Normal range of motion.  Neurological: She is alert and oriented to person, place, and time.  Skin: Skin is dry. There is pallor.  Skin is mottled and lower extremities  Psychiatric:  She has a normal mood and affect.  Nursing note and vitals reviewed.    ED Treatments / Results  Labs (all labs ordered are listed, but only abnormal results are displayed) Labs Reviewed  BASIC METABOLIC PANEL - Abnormal; Notable for the following:       Result Value   Chloride 95 (*)    CO2 8 (*)    Glucose, Bld 426 (*)    Creatinine, Ser 2.04 (*)    GFR calc non Af Amer 31 (*)    GFR calc Af Amer 36 (*)    Anion gap 32 (*)    All other components within normal limits  CBC - Abnormal; Notable for the following:    WBC 13.0 (*)    Hemoglobin 16.1 (*)    HCT 46.3  (*)    All other components within normal limits  CBG MONITORING, ED - Abnormal; Notable for the following:    Glucose-Capillary 454 (*)    All other components within normal limits  I-STAT TROPOININ, ED  I-STAT TROPOININ, ED  POC URINE PREG, ED  I-STAT CG4 LACTIC ACID, ED  I-STAT ARTERIAL BLOOD GAS, ED    EKG  EKG Interpretation  Date/Time:  Friday January 15 2017 20:10:27 EDT Ventricular Rate:  165 PR Interval:    QRS Duration: 66 QT Interval:  294 QTC Calculation: 487 R Axis:   75 Text Interpretation:  Sinus tachycardia Nonspecific ST and T wave abnormality Abnormal ECG Confirmed by Shabazz Mckey MD, Duwayne Heck [phone]) on 01/15/2017 10:08:01 PM       Radiology Dg Chest 2 View  Result Date: 01/15/2017 CLINICAL DATA:  Acute onset of shortness of breath and mid chest pain. Initial encounter. EXAM: CHEST  2 VIEW COMPARISON:  Chest radiograph performed 09/30/2015 FINDINGS: The lungs are well-aerated and clear. There is no evidence of focal opacification, pleural effusion or pneumothorax. The heart is normal in size; the mediastinal contour is within normal limits. No acute osseous abnormalities are seen. IMPRESSION: No acute cardiopulmonary process seen. Electronically Signed   By: Roanna Raider M.D.   On: 01/15/2017 21:09    Procedures Procedures (including critical care time)  Medications Ordered in ED Medications  insulin regular (NOVOLIN R,HUMULIN R) 250 Units in sodium chloride 0.9 % 250 mL (1 Units/mL) infusion (not administered)  sodium chloride 0.9 % bolus 1,000 mL (not administered)    And  sodium chloride 0.9 % bolus 1,000 mL (not administered)    And  0.9 %  sodium chloride infusion (not administered)  dextrose 5 %-0.45 % sodium chloride infusion (not administered)     Initial Impression / Assessment and Plan / ED Course  I have reviewed the triage vital signs and the nursing notes.  Pertinent labs & imaging results that were available during my care of the patient were  reviewed by me and considered in my medical decision making (see chart for details). 36 year old female with history of non-insulin-dependent diabetes presents today with cough congestion fever appears to be in DKA. She is very tachycardic but is not hypotensive here. She is receiving IV fluids and insulin drip started.       11:39 PM Patient continues tachycardiac   BP okay.  .  Discussed with Dr. Darrick Penna  And critical care is seeing patient.  CRITICAL CARE Performed by: Hilario Quarry Total critical care time: 45 minutes Critical care time was exclusive of separately billable procedures and treating other patients. Critical care was necessary to treat or prevent imminent or life-threatening  deterioration. Critical care was time spent personally by me on the following activities: development of treatment plan with patient and/or surrogate as well as nursing, discussions with consultants, evaluation of patient's response to treatment, examination of patient, obtaining history from patient or surrogate, ordering and performing treatments and interventions, ordering and review of laboratory studies, ordering and review of radiographic studies, pulse oximetry and re-evaluation of patient's condition.   Final Clinical Impressions(s) / ED Diagnoses   Final diagnoses:  Diabetic ketoacidosis without coma associated with type 2 diabetes mellitus (HCC)  Tachycardia  Elevated troponin    New Prescriptions New Prescriptions   No medications on file     Margarita Grizzle, MD 01/15/17 2352    Margarita Grizzle, MD 01/16/17 1610

## 2017-01-15 NOTE — ED Notes (Signed)
Dr. Rosalia Hammers and Caitlyn-RN notified of elevated I-stat trop of 0.46

## 2017-01-15 NOTE — ED Notes (Signed)
Delay in lab draw xray currently in room.

## 2017-01-15 NOTE — ED Notes (Signed)
Pt given water per MD  

## 2017-01-15 NOTE — ED Notes (Signed)
ED Provider at bedside. 

## 2017-01-15 NOTE — ED Triage Notes (Signed)
Pt tachy at triage, HR = 163.

## 2017-01-15 NOTE — ED Triage Notes (Signed)
Pt complaining of SOB and cough. Pt states coughing up green sputum + blood today. Pt also complaining of nasal congestion x 3 days.

## 2017-01-16 DIAGNOSIS — E878 Other disorders of electrolyte and fluid balance, not elsewhere classified: Secondary | ICD-10-CM

## 2017-01-16 DIAGNOSIS — E0810 Diabetes mellitus due to underlying condition with ketoacidosis without coma: Secondary | ICD-10-CM

## 2017-01-16 DIAGNOSIS — J209 Acute bronchitis, unspecified: Secondary | ICD-10-CM

## 2017-01-16 LAB — BASIC METABOLIC PANEL
ANION GAP: 9 (ref 5–15)
ANION GAP: 13 (ref 5–15)
ANION GAP: 11 (ref 5–15)
Anion gap: 22 — ABNORMAL HIGH (ref 5–15)
Anion gap: 13 (ref 5–15)
BUN: 13 mg/dL (ref 6–20)
BUN: 11 mg/dL (ref 6–20)
BUN: 9 mg/dL (ref 6–20)
BUN: 8 mg/dL (ref 6–20)
BUN: 7 mg/dL (ref 6–20)
CALCIUM: 6.9 mg/dL — AB (ref 8.9–10.3)
CALCIUM: 7 mg/dL — AB (ref 8.9–10.3)
CALCIUM: 7.1 mg/dL — AB (ref 8.9–10.3)
CALCIUM: 7.4 mg/dL — AB (ref 8.9–10.3)
CO2: 7 mmol/L — ABNORMAL LOW (ref 22–32)
CO2: 13 mmol/L — ABNORMAL LOW (ref 22–32)
CO2: 15 mmol/L — ABNORMAL LOW (ref 22–32)
CO2: 11 mmol/L — ABNORMAL LOW (ref 22–32)
CO2: 15 mmol/L — ABNORMAL LOW (ref 22–32)
CREATININE: 1.69 mg/dL — AB (ref 0.44–1.00)
Calcium: 7.2 mg/dL — ABNORMAL LOW (ref 8.9–10.3)
Chloride: 111 mmol/L (ref 101–111)
Chloride: 110 mmol/L (ref 101–111)
Chloride: 112 mmol/L — ABNORMAL HIGH (ref 101–111)
Chloride: 112 mmol/L — ABNORMAL HIGH (ref 101–111)
Chloride: 111 mmol/L (ref 101–111)
Creatinine, Ser: 1.28 mg/dL — ABNORMAL HIGH (ref 0.44–1.00)
Creatinine, Ser: 1.14 mg/dL — ABNORMAL HIGH (ref 0.44–1.00)
Creatinine, Ser: 1.15 mg/dL — ABNORMAL HIGH (ref 0.44–1.00)
Creatinine, Ser: 1.03 mg/dL — ABNORMAL HIGH (ref 0.44–1.00)
GFR calc Af Amer: >60 mL/min (ref >60)
GFR, EST AFRICAN AMERICAN: 45 mL/min — AB (ref >60)
GFR, EST NON AFRICAN AMERICAN: 39 mL/min — AB (ref >60)
GFR, EST NON AFRICAN AMERICAN: 54 mL/min — AB (ref >60)
GLUCOSE: 171 mg/dL — AB (ref 65–99)
GLUCOSE: 207 mg/dL — AB (ref 65–99)
Glucose, Bld: 252 mg/dL — ABNORMAL HIGH (ref 65–99)
Glucose, Bld: 194 mg/dL — ABNORMAL HIGH (ref 65–99)
Glucose, Bld: 214 mg/dL — ABNORMAL HIGH (ref 65–99)
POTASSIUM: 2.8 mmol/L — AB (ref 3.5–5.1)
POTASSIUM: 3 mmol/L — AB (ref 3.5–5.1)
Potassium: 3.3 mmol/L — ABNORMAL LOW (ref 3.5–5.1)
Potassium: 3.1 mmol/L — ABNORMAL LOW (ref 3.5–5.1)
Potassium: 3.6 mmol/L (ref 3.5–5.1)
SODIUM: 140 mmol/L (ref 135–145)
SODIUM: 136 mmol/L (ref 135–145)
SODIUM: 136 mmol/L (ref 135–145)
SODIUM: 136 mmol/L (ref 135–145)
SODIUM: 137 mmol/L (ref 135–145)

## 2017-01-16 LAB — INFLUENZA PANEL BY PCR (TYPE A & B)
INFLAPCR: NEGATIVE
Influenza B By PCR: NEGATIVE

## 2017-01-16 LAB — GLUCOSE, CAPILLARY
GLUCOSE-CAPILLARY: 167 mg/dL — AB (ref 65–99)
GLUCOSE-CAPILLARY: 180 mg/dL — AB (ref 65–99)
GLUCOSE-CAPILLARY: 177 mg/dL — AB (ref 65–99)
GLUCOSE-CAPILLARY: 171 mg/dL — AB (ref 65–99)
GLUCOSE-CAPILLARY: 182 mg/dL — AB (ref 65–99)
GLUCOSE-CAPILLARY: 125 mg/dL — AB (ref 65–99)
GLUCOSE-CAPILLARY: 156 mg/dL — AB (ref 65–99)
GLUCOSE-CAPILLARY: 199 mg/dL — AB (ref 65–99)
Glucose-Capillary: 310 mg/dL — ABNORMAL HIGH (ref 65–99)
Glucose-Capillary: 253 mg/dL — ABNORMAL HIGH (ref 65–99)
Glucose-Capillary: 167 mg/dL — ABNORMAL HIGH (ref 65–99)
Glucose-Capillary: 164 mg/dL — ABNORMAL HIGH (ref 65–99)
Glucose-Capillary: 170 mg/dL — ABNORMAL HIGH (ref 65–99)
Glucose-Capillary: 149 mg/dL — ABNORMAL HIGH (ref 65–99)
Glucose-Capillary: 184 mg/dL — ABNORMAL HIGH (ref 65–99)
Glucose-Capillary: 209 mg/dL — ABNORMAL HIGH (ref 65–99)
Glucose-Capillary: 206 mg/dL — ABNORMAL HIGH (ref 65–99)
Glucose-Capillary: 195 mg/dL — ABNORMAL HIGH (ref 65–99)
Glucose-Capillary: 152 mg/dL — ABNORMAL HIGH (ref 65–99)
Glucose-Capillary: 160 mg/dL — ABNORMAL HIGH (ref 65–99)

## 2017-01-16 LAB — LACTIC ACID, PLASMA
LACTIC ACID, VENOUS: 1.6 mmol/L (ref 0.5–1.9)
LACTIC ACID, VENOUS: 2.2 mmol/L — AB (ref 0.5–1.9)

## 2017-01-16 LAB — COMPREHENSIVE METABOLIC PANEL
ALT: 32 U/L (ref 14–54)
AST: 22 U/L (ref 15–41)
Albumin: 3.5 g/dL (ref 3.5–5.0)
Alkaline Phosphatase: 115 U/L (ref 38–126)
BILIRUBIN TOTAL: 2 mg/dL — AB (ref 0.3–1.2)
BUN: 16 mg/dL (ref 6–20)
CALCIUM: 7.7 mg/dL — AB (ref 8.9–10.3)
CO2: <7 mmol/L — ABNORMAL LOW (ref 22–32)
CREATININE: 1.9 mg/dL — AB (ref 0.44–1.00)
Chloride: 105 mmol/L (ref 101–111)
GFR, EST AFRICAN AMERICAN: 39 mL/min — AB (ref >60)
GFR, EST NON AFRICAN AMERICAN: 33 mL/min — AB (ref >60)
Glucose, Bld: 445 mg/dL — ABNORMAL HIGH (ref 65–99)
Potassium: 4.1 mmol/L (ref 3.5–5.1)
Sodium: 139 mmol/L (ref 135–145)
Total Protein: 6.7 g/dL (ref 6.5–8.1)

## 2017-01-16 LAB — CBC
HEMATOCRIT: 41.2 % (ref 36.0–46.0)
Hemoglobin: 14 g/dL (ref 12.0–15.0)
MCH: 32.1 pg (ref 26.0–34.0)
MCHC: 34 g/dL (ref 30.0–36.0)
MCV: 94.5 fL (ref 78.0–100.0)
PLATELETS: 200 10*3/uL (ref 150–400)
RBC: 4.36 MIL/uL (ref 3.87–5.11)
RDW: 12.8 % (ref 11.5–15.5)
WBC: 9.4 10*3/uL (ref 4.0–10.5)

## 2017-01-16 LAB — PHOSPHORUS: Phosphorus: <1 mg/dL — CL (ref 2.5–4.6)

## 2017-01-16 LAB — PROTIME-INR
INR: 1.21
PROTHROMBIN TIME: 15.3 s — AB (ref 11.4–15.2)

## 2017-01-16 LAB — APTT: aPTT: 23 seconds — ABNORMAL LOW (ref 24–36)

## 2017-01-16 LAB — TYPE AND SCREEN
ABO/RH(D): O POS
ANTIBODY SCREEN: NEGATIVE

## 2017-01-16 LAB — MRSA PCR SCREENING: MRSA by PCR: NEGATIVE

## 2017-01-16 LAB — CBG MONITORING, ED: Glucose-Capillary: 404 mg/dL — ABNORMAL HIGH (ref 65–99)

## 2017-01-16 LAB — MAGNESIUM: Magnesium: 0.6 mg/dL — CL (ref 1.7–2.4)

## 2017-01-16 LAB — PROCALCITONIN: Procalcitonin: 0.14 ng/mL

## 2017-01-16 LAB — TROPONIN I: Troponin I: <0.03 ng/mL (ref <0.03)

## 2017-01-16 LAB — HIV ANTIBODY (ROUTINE TESTING W REFLEX): HIV SCREEN 4TH GENERATION: NONREACTIVE

## 2017-01-16 LAB — ABO/RH: ABO/RH(D): O POS

## 2017-01-16 LAB — CORTISOL: CORTISOL PLASMA: 50.3 ug/dL

## 2017-01-16 MED ORDER — OSELTAMIVIR PHOSPHATE 75 MG PO CAPS
75.0000 mg | ORAL_CAPSULE | Freq: Once | ORAL | Status: AC
  Administered 2017-01-16: 75 mg via ORAL
  Filled 2017-01-16: qty 1

## 2017-01-16 MED ORDER — POTASSIUM CHLORIDE CRYS ER 20 MEQ PO TBCR: 40.0000 meq | EXTENDED_RELEASE_TABLET | Freq: Once | ORAL | Status: DC

## 2017-01-16 MED ORDER — SODIUM CHLORIDE 0.9 % IV BOLUS (SEPSIS)
2000.0000 mL | Freq: Once | INTRAVENOUS | Status: AC
  Administered 2017-01-16: 2000 mL via INTRAVENOUS

## 2017-01-16 MED ORDER — SODIUM CHLORIDE 0.9 % IV BOLUS (SEPSIS)
500.0000 mL | Freq: Once | INTRAVENOUS | Status: AC
  Administered 2017-01-16: 500 mL via INTRAVENOUS

## 2017-01-16 MED ORDER — DEXTROSE 5 % IV SOLN
6.0000 g | Freq: Once | INTRAVENOUS | Status: AC
  Administered 2017-01-16: 6 g via INTRAVENOUS
  Filled 2017-01-16: qty 12

## 2017-01-16 MED ORDER — DEXTROSE 5 % IV SOLN
30.0000 mmol | Freq: Once | INTRAVENOUS | Status: AC
  Administered 2017-01-16: 30 mmol via INTRAVENOUS
  Filled 2017-01-16: qty 10

## 2017-01-16 MED ORDER — POTASSIUM CHLORIDE CRYS ER 20 MEQ PO TBCR
40.0000 meq | EXTENDED_RELEASE_TABLET | Freq: Once | ORAL | Status: AC
Start: 2017-01-16 — End: 2017-01-16
  Administered 2017-01-16: 40 meq via ORAL
  Filled 2017-01-16: qty 2

## 2017-01-16 MED ORDER — CEPHALEXIN 500 MG PO CAPS
500.0000 mg | ORAL_CAPSULE | Freq: Three times a day (TID) | ORAL | Status: DC
  Administered 2017-01-16 – 2017-01-17 (×3): 500 mg via ORAL
  Filled 2017-01-16 (×5): qty 1

## 2017-01-16 MED ORDER — POTASSIUM CHLORIDE CRYS ER 20 MEQ PO TBCR
40.0000 meq | EXTENDED_RELEASE_TABLET | ORAL | Status: AC
  Administered 2017-01-16 (×3): 40 meq via ORAL
  Filled 2017-01-16 (×3): qty 2

## 2017-01-16 MED ORDER — GUAIFENESIN 100 MG/5ML PO SOLN
5.0000 mL | Freq: Two times a day (BID) | ORAL | Status: DC | PRN
  Administered 2017-01-16: 100 mg via ORAL
  Filled 2017-01-16 (×2): qty 5

## 2017-01-16 MED ORDER — METOPROLOL TARTRATE 5 MG/5ML IV SOLN
2.5000 mg | INTRAVENOUS | Status: DC | PRN
  Administered 2017-01-16 (×2): 2.5 mg via INTRAVENOUS
  Filled 2017-01-16 (×2): qty 5

## 2017-01-16 MED ORDER — ONDANSETRON HCL 4 MG/2ML IJ SOLN
4.0000 mg | Freq: Three times a day (TID) | INTRAMUSCULAR | Status: DC | PRN
  Administered 2017-01-16 – 2017-01-17 (×2): 4 mg via INTRAVENOUS
  Filled 2017-01-16 (×2): qty 2

## 2017-01-16 MED ORDER — HYDROCOD POLST-CPM POLST ER 10-8 MG/5ML PO SUER
5.0000 mL | Freq: Two times a day (BID) | ORAL | Status: DC | PRN
  Administered 2017-01-16 (×2): 5 mL via ORAL
  Filled 2017-01-16 (×2): qty 5

## 2017-01-16 MED ORDER — GUAIFENESIN 100 MG/5ML PO SOLN
5.0000 mL | Freq: Two times a day (BID) | ORAL | Status: DC
  Filled 2017-01-16: qty 5

## 2017-01-16 MED ORDER — POTASSIUM CHLORIDE CRYS ER 20 MEQ PO TBCR
40.0000 meq | EXTENDED_RELEASE_TABLET | Freq: Once | ORAL | Status: AC
  Administered 2017-01-16: 40 meq via ORAL
  Filled 2017-01-16: qty 2

## 2017-01-16 NOTE — Progress Notes (Signed)
BMET results were called to Dr. Marchelle Gearing. See new orders

## 2017-01-16 NOTE — Progress Notes (Signed)
CRITICAL VALUE ALERT  Critical value received: Mg 0.6, Phosphorus <1 (K 2.8)  Date of notification: 01/16/17  Time of notification: 1308  Critical value read back:yes  Nurse who received alert:  Marica Otter, RN  MD notified (1st page):yes    Time of first page: 1308  Responding MD:  Dr. Marchelle Gearing  Time MD responded: [phone]

## 2017-01-16 NOTE — Assessment & Plan Note (Signed)
Flu negative Hx c/w acute bronchitis  Plan Dc tamilfu Do oral cephalexin x 5 days

## 2017-01-16 NOTE — Progress Notes (Signed)
eLink Physician-Brief Progress Note Patient Name: Kayla Erickson DOB: 1980-10-26 MRN: [id number]   Date of Service  01/16/2017  HPI/Events of Note  Tachycardia in the setting of normal BP after fluid bolus for DKA.  Also with nausea and cough  eICU Interventions  Plan: PRN lopressor PRN zofran PRN tussinex     Intervention Category Intermediate Interventions: Electrolyte abnormality - evaluation and management  Mariona Scholes 01/16/2017, 1:42 AM

## 2017-01-16 NOTE — Progress Notes (Signed)
PULMONARY / CRITICAL CARE MEDICINE   Name: Kayla Erickson MRN: [id number] DOB: 11-29-1980    ADMISSION DATE:  01/15/2017  CHIEF COMPLAINT:  Dyspnea  brief Kayla Erickson is a 36 y/o woman with a history diabetes (type II) on metformin who presented to the ED with dyspnea and was found to be in DKA. She reported a vague prodrome of possible fevers, but no dysuria, productive cough, or other signs of systemic infection.  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  PAST SURGICAL HISTORY: She  has no past surgical history on file.  EVENTs 01/15/2017  A- admiot   SUBJECTIVE/OVERNIGHT/INTERVAL HX 01/16/17 - better but HR 130 and bic 13. Stil on insulni gtt. Sugar 170-180. c./so cough with yello sputum   VITAL SIGNS: BP 116/69   Pulse (!) 121   Temp 98.3 F (36.8 C) (Oral)   Resp 20   Ht  (1.6 m)   Wt 70 kg (154 lb 5.2 oz)   LMP 12/25/2016 (Approximate)   SpO2 97%   BMI 27.34 kg/m   HEMODYNAMICS:    VENTILATOR SETTINGS:    INTAKE / OUTPUT: I/O last 3 completed shifts: In: 3767 [I.V.:717; IV Piggyback:3050] Out: -  \   EXAM  General Appearance:    Looks better th an described OBESE - yes  Head:    Normocephalic, without obvious abnormality, atraumatic  Eyes:    PERRL - yes, conjunctiva/corneas - clear      Ears:    Normal external ear canals, both ears  Nose:   NG tube - no  Throat:  ETT TUBE - non , OG tube - no  Neck:   Supple,  No enlargement/tenderness/nodules     Lungs:     Clear to auscultation bilaterally but has junky cough  Chest wall:    No deformity  Heart:    S1 and S2 normal, no murmur, CVP - no.  Pressors - no  Abdomen:     Soft, no masses, no organomegaly  Genitalia:    Not done  Rectal:   not done  Extremities:   Extremities- intact     Skin:   Intact in  exposed areas . Sacral area - no decub reported     Neurologic:   Sedation - none -> RASS - +1 . Moves all 4s - yes. CAM-ICU - neg . Orientation - x 3+       LABS  PULMONARY  Recent Labs Lab 01/15/17 2342  PHART 7.012*  PCO2ART 15.0*  PO2ART 120.0*  HCO3 3.8*  TCO2 <5  O2SAT 96.0    CBC  Recent Labs Lab 01/15/17 2012 01/16/17 0002  HGB 16.1* 14.0  HCT 46.3* 41.2  WBC 13.0* 9.4  PLT 283 200    COAGULATION  Recent Labs Lab 01/16/17 0002  INR 1.21    CARDIAC   Recent Labs Lab 01/16/17 0002  TROPONINI <0.03   No results for input(s): PROBNP in the last 168 hours.   CHEMISTRY  Recent Labs Lab 01/15/17 2012 01/16/17 0002 01/16/17 0255 01/16/17 0701  NA 135 139 140 136  K 3.8 4.1 3.3* 3.1*  CL 95* 105 111 110  CO2 8* <7* 7* 13*  GLUCOSE 426* 445* 252* 194*  BUN CREATININE 2.04* 1.90* 1.69* 1.28*  CALCIUM 9.2 7.7* 7.2* 6.9*  Estimated Creatinine Clearance: 58.1 mL/min (A) (by C-G formula based on SCr of 1.28 mg/dL (H)).   LIVER  Recent Labs Lab 01/16/17 0002  AST 22  ALT 32  ALKPHOS 115  BILITOT 2.0*  PROT 6.7  ALBUMIN 3.5  INR 1.21     INFECTIOUS  Recent Labs Lab 01/15/17 2310 01/16/17 0002 01/16/17 0255  LATICACIDVEN 2.30* 2.2* 1.6  PROCALCITON  --  0.14  --      ENDOCRINE CBG (last 3)   Recent Labs  01/16/17 0843 01/16/17 0952 01/16/17 1054  GLUCAP 182* 164* 170*         IMAGING x48h  - image(s) personally visualized  -   highlighted in bold Dg Chest 2 View  Result Date: 01/15/2017 CLINICAL DATA:  Acute onset of shortness of breath and mid chest pain. Initial encounter. EXAM: CHEST  2 VIEW COMPARISON:  Chest radiograph performed 09/30/2015 FINDINGS: The lungs are well-aerated and clear. There is no evidence of focal opacification, pleural effusion or pneumothorax. The heart is normal in size; the mediastinal contour is within normal limits. No acute osseous abnormalities are seen.  IMPRESSION: No acute cardiopulmonary process seen. Electronically Signed   By: Roanna Raider M.D.   On: 01/15/2017 21:09       ASSESSMENT and PLAN  DKA (diabetic ketoacidoses) (HCC) Improved but still tachy and Bic 13. Looks a bit dry.  GAp closed  Plan 500cc fluid bolus Continue insulin gtt and d5 saline till bic >/ = 20   SINUS TACHYCARDIA Fluid bolus and monitor with dka Rx  Electrolyte imbalance Correct k Check magn and phos 11:15 AM 01/16/2017 and daily  Acute bronchitis Flu negative Hx c/w acute bronchitis  Plan Dc tamilfu Do oral cephalexin x 5 days      FAMILY  - Updates: 01/16/2017 --> patient uipdated  - Inter-disciplinary family meet or Palliative Care meeting due by:  DAy 7. Current LOS is LOS 1 days  CODE STATUS    Code Status Orders        Start     Ordered   01/15/17 2344  Full code  Continuous     01/15/17 2348    Code Status History    Date Active Date Inactive Code Status Order ID Comments User Context   07/31/2012  9:57 AM 08/01/2012  3:05 PM Full Code [id number]  Hollice Espy, MD Inpatient        DISPO Transfer to floor for triad pick up 01/17/17 and pccm off - d/w [place]ung   Dr. Kalman Shan, M.D., [facility].C.P Pulmonary and Critical Care Medicine Staff Physician [facility] Pager: [phone], If no answer or between  15:00h - 7:00h: call [phone]  01/16/2017 11:07 AM

## 2017-01-16 NOTE — Assessment & Plan Note (Signed)
Fluid bolus and monitor with dka Rx

## 2017-01-16 NOTE — Progress Notes (Addendum)
   eLink Physician Progress Note and Electrolyte Replacement  Patient Name: Kayla Erickson DOB: [birth year] MRN: [id number]  Date of Service  01/16/2017   HPI/Events of Note    Recent Labs Lab 01/15/17 2012 01/16/17 0002 01/16/17 0255 01/16/17 0701 01/16/17 1100 01/16/17 1108  NA 135 139 140 136 136  --   K 3.8 4.1 3.3* 3.1* 2.8*  --   CL 95* 105 111 110 112*  --   CO2 8* <7* 7* 13* 15*  --   GLUCOSE 426* 445* 252* 194* 171*  --   BUN --   CREATININE 2.04* 1.90* 1.69* 1.28* 1.14*  --   CALCIUM 9.2 7.7* 7.2* 6.9* 7.0*  --   MG  --   --   --   --   --  0.6*  PHOS  --   --   --   --   --  <1.0*    Estimated Creatinine Clearance: 65.2 mL/min (A) (by C-G formula based on SCr of 1.14 mg/dL (H)).  Intake/Output      03/30 0701 - 03/31 0700 03/31 0701 - 04/01 0700   I.V. (mL/kg) 717 (10.2) 513.7 (7.3)   IV Piggyback 3050 500   Total Intake(mL/kg) 3767 (53.8) 1013.7 (14.5)   Urine (mL/kg/hr)  350 (0.8)   Total Output   350   Net +3767 +663.7        Urine Occurrence 3 x     - I/O DETAILED x 24h    Total I/O In: 1013.7 [I.V.:513.7; IV Piggyback:500] Out: 350 [Urine:350] - I/O THIS SHIFT    ASSESSMENT Hypokalemia Hypomagnesemia Hypophosphatemia   eICURN Interventions  Mag sulfate 6gm k phos Kcl x 3 po x q65min   ASSESSMENT: MAJOR ELECTROLYTE      Dr. Kalman Shan, M.D., [facility].C.P Pulmonary and Critical Care Medicine Staff Physician [facility] Pager: [phone], If no answer or between  15:00h - 7:00h: call [phone]  01/16/2017 1:13 PM

## 2017-01-16 NOTE — H&P (Signed)
PULMONARY / CRITICAL CARE MEDICINE   Name: Kayla Erickson MRN: [id number] DOB: 1981/02/19    ADMISSION DATE:  01/15/2017  CHIEF COMPLAINT:  Dyspnea  HISTORY OF PRESENT ILLNESS:   Kayla Erickson is a 36 y/o woman with a history diabetes (type II) on metformin who presented to the ED with dyspnea and was found to be in DKA. She reported a vague prodrome of possible fevers, but no dysuria, productive cough, or other signs of systemic infection.  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  PAST SURGICAL HISTORY: She  has no past surgical history on file.  No Known Allergies  No current facility-administered medications on file prior to encounter.    Current Outpatient Prescriptions on File Prior to Encounter  Medication Sig  . atenolol (TENORMIN) 50 MG tablet Take 1 tablet (50 mg total) by mouth daily. (Patient taking differently: Take 50 mg by mouth 2 (two) times daily. )  . metFORMIN (GLUCOPHAGE) 1000 MG tablet TAKE ONE TABLET by mouth TWICE DAILY WITH MEALS     FAMILY HISTORY:  Her has no family status information on file.    SOCIAL HISTORY: She  reports that she has never smoked. She has never used smokeless tobacco. She reports that she drinks alcohol. She reports that she does not use drugs.  REVIEW OF SYSTEMS:   Per HPI   VITAL SIGNS: BP 134/78 (BP Location: Right Arm)   Pulse (!) 150   Temp 98.9 F (37.2 C)   Resp (!) 21   Ht  (1.6 m)   Wt 154 lb 5.2 oz (70 kg)   LMP 12/25/2016 (Approximate)   SpO2 100%   BMI 27.34 kg/m   HEMODYNAMICS:    VENTILATOR SETTINGS:    INTAKE / OUTPUT: No intake/output data recorded.  PHYSICAL EXAMINATION: General:  Woman appearing older than stated age, tachypneic, appearing ill Neuro:  Awake, alert, oriented  HEENT:  Dry  MM Cardiovascular:  Normal S1/S2 Lungs:  CTA Abdomen:  Soft, nontender Musculoskeletal:  No deformities Skin:  No rashes on visible skin  LABS:  BMET  Recent Labs Lab 01/15/17 2012 01/16/17 0002  NA 135 139  K 3.8 4.1  CL 95* 105  CO2 8* <7*  BUN 15 16  CREATININE 2.04* 1.90*  GLUCOSE 426* 445*    Electrolytes  Recent Labs Lab 01/15/17 2012 01/16/17 0002  CALCIUM 9.2 7.7*    CBC  Recent Labs Lab 01/15/17 2012 01/16/17 0002  WBC 13.0* 9.4  HGB 16.1* 14.0  HCT 46.3* 41.2  PLT 283 200    Coag's  Recent Labs Lab 01/16/17 0002  APTT 23*  INR 1.21    Sepsis Markers  Recent Labs Lab 01/15/17 2310 01/16/17 0002  LATICACIDVEN 2.30* 2.2*  PROCALCITON  --  0.14    ABG  Recent Labs Lab 01/15/17 2342  PHART 7.012*  PCO2ART 15.0*  PO2ART 120.0*    Liver Enzymes  Recent Labs Lab 01/16/17 0002  AST 22  ALT 32  ALKPHOS 115  BILITOT 2.0*  ALBUMIN 3.5    Cardiac Enzymes  Recent Labs Lab 01/16/17 0002  TROPONINI <0.03    Glucose  Recent Labs Lab 01/15/17 2152 01/15/17 2302 01/15/17 2333 01/16/17 0037  GLUCAP 454* 420* 425* 404*    Imaging Dg Chest 2 View  Result Date: 01/15/2017 CLINICAL DATA:  Acute  onset of shortness of breath and mid chest pain. Initial encounter. EXAM: CHEST  2 VIEW COMPARISON:  Chest radiograph performed 09/30/2015 FINDINGS: The lungs are well-aerated and clear. There is no evidence of focal opacification, pleural effusion or pneumothorax. The heart is normal in size; the mediastinal contour is within normal limits. No acute osseous abnormalities are seen. IMPRESSION: No acute cardiopulmonary process seen. Electronically Signed   By: Roanna Raider M.D.   On: 01/15/2017 21:09     STUDIES:  ABG: pH 7.01  CULTURES: Blood 3/30 >> Pending Urine 3/30 >> pending  ANTIBIOTICS: Vanc x1 Pip-tazo x1  SIGNIFICANT EVENTS: None  LINES/TUBES: PIVs  DISCUSSION: 36 y/o woman with DKA, unclear  cause  ASSESSMENT / PLAN:  PULMONARY A: Mild respiratory alkalosis P:   Doing well for now, so s/sx of fatigue or impending respiratory failure  CARDIOVASCULAR A:  Tachycardia Troponinemia P:  Do not believe the positive/elevated troponin was accurate: f/u was negative Tachycardia due to dehydration.  RENAL A:   AKI due to dehydration / DKA Severe metabolic acidosis P:   Resuscitation underway Avoid bicarb unless pH < 7.0 as will worsen respiratory workload   GASTROINTESTINAL A:   No active issues P:    HEMATOLOGIC A:   No active issues P:   INFECTIOUS A:   Possible URI as cause Cx pending P:   Flu swab negative  ENDOCRINE A:   DKA P:   On DKA protocol. Still quite dehydrated, gap still wide Lactic acidosis needs to be trended as well; may be as a result of metformin and renal impairment. Unclear trigger  NEUROLOGIC A:   No active issues P:   RASS goal: 0 NTD  FAMILY  - Updates:   - Inter-disciplinary family meet or Palliative Care meeting due by:  01/22/17  CRITICAL CARE Performed by: Jamie Kato   Total critical care time: 40 minutes  Critical care time was exclusive of separately billable procedures and treating other patients.  Critical care was necessary to treat or prevent imminent or life-threatening deterioration.  Critical care was time spent personally by me on the following activities: development of treatment plan with patient and/or surrogate as well as nursing, discussions with consultants, evaluation of patient's response to treatment, examination of patient, obtaining history from patient or surrogate, ordering and performing treatments and interventions, ordering and review of laboratory studies, ordering and review of radiographic studies, pulse oximetry and re-evaluation of patient's condition.   Pulmonary and Critical Care Medicine [facility] Pager: [phone]  01/16/2017, 2:57 AM

## 2017-01-16 NOTE — ED Notes (Signed)
Pt ambulatory to restroom with RN; steady gait noted; pt returned to bed and placed back on cardiac monitor

## 2017-01-16 NOTE — Progress Notes (Signed)
Spoke to MD about Potassium still in low range. See new orders.

## 2017-01-16 NOTE — Assessment & Plan Note (Signed)
Correct k Check magn and phos 11:15 AM 01/16/2017 and daily

## 2017-01-16 NOTE — Assessment & Plan Note (Signed)
Improved but still tachy and Bic 13. Looks a bit dry.  GAp closed  Plan 500cc fluid bolus Continue insulin gtt and d5 saline till bic >/ = 20

## 2017-01-17 DIAGNOSIS — R000 Tachycardia, unspecified: Secondary | ICD-10-CM

## 2017-01-17 DIAGNOSIS — E1310 Other specified diabetes mellitus with ketoacidosis without coma: Secondary | ICD-10-CM

## 2017-01-17 LAB — BASIC METABOLIC PANEL
ANION GAP: 12 (ref 5–15)
ANION GAP: 7 (ref 5–15)
Anion gap: 10 (ref 5–15)
BUN: 5 mg/dL — AB (ref 6–20)
BUN: <5 mg/dL — ABNORMAL LOW (ref 6–20)
CALCIUM: 7.6 mg/dL — AB (ref 8.9–10.3)
CHLORIDE: 111 mmol/L (ref 101–111)
CO2: 17 mmol/L — ABNORMAL LOW (ref 22–32)
CO2: 16 mmol/L — ABNORMAL LOW (ref 22–32)
CO2: 18 mmol/L — AB (ref 22–32)
CREATININE: 0.77 mg/dL (ref 0.44–1.00)
Calcium: 7.4 mg/dL — ABNORMAL LOW (ref 8.9–10.3)
Calcium: 7.3 mg/dL — ABNORMAL LOW (ref 8.9–10.3)
Chloride: 110 mmol/L (ref 101–111)
Chloride: 109 mmol/L (ref 101–111)
Creatinine, Ser: 0.83 mg/dL (ref 0.44–1.00)
Creatinine, Ser: 1.03 mg/dL — ABNORMAL HIGH (ref 0.44–1.00)
GFR calc Af Amer: >60 mL/min (ref >60)
GFR calc Af Amer: >60 mL/min (ref >60)
GFR calc non Af Amer: >60 mL/min (ref >60)
GLUCOSE: 257 mg/dL — AB (ref 65–99)
Glucose, Bld: 118 mg/dL — ABNORMAL HIGH (ref 65–99)
Glucose, Bld: 142 mg/dL — ABNORMAL HIGH (ref 65–99)
POTASSIUM: 2.9 mmol/L — AB (ref 3.5–5.1)
Potassium: 3.2 mmol/L — ABNORMAL LOW (ref 3.5–5.1)
Potassium: 3.5 mmol/L (ref 3.5–5.1)
SODIUM: 137 mmol/L (ref 135–145)
Sodium: 134 mmol/L — ABNORMAL LOW (ref 135–145)
Sodium: 139 mmol/L (ref 135–145)

## 2017-01-17 LAB — GLUCOSE, CAPILLARY
GLUCOSE-CAPILLARY: 119 mg/dL — AB (ref 65–99)
GLUCOSE-CAPILLARY: 120 mg/dL — AB (ref 65–99)
GLUCOSE-CAPILLARY: 150 mg/dL — AB (ref 65–99)
GLUCOSE-CAPILLARY: 157 mg/dL — AB (ref 65–99)
GLUCOSE-CAPILLARY: 169 mg/dL — AB (ref 65–99)
Glucose-Capillary: 150 mg/dL — ABNORMAL HIGH (ref 65–99)
Glucose-Capillary: 119 mg/dL — ABNORMAL HIGH (ref 65–99)
Glucose-Capillary: 126 mg/dL — ABNORMAL HIGH (ref 65–99)
Glucose-Capillary: 140 mg/dL — ABNORMAL HIGH (ref 65–99)
Glucose-Capillary: 145 mg/dL — ABNORMAL HIGH (ref 65–99)
Glucose-Capillary: 172 mg/dL — ABNORMAL HIGH (ref 65–99)
Glucose-Capillary: 125 mg/dL — ABNORMAL HIGH (ref 65–99)
Glucose-Capillary: 264 mg/dL — ABNORMAL HIGH (ref 65–99)
Glucose-Capillary: 324 mg/dL — ABNORMAL HIGH (ref 65–99)

## 2017-01-17 LAB — MAGNESIUM: Magnesium: 1.4 mg/dL — ABNORMAL LOW (ref 1.7–2.4)

## 2017-01-17 LAB — PHOSPHORUS: PHOSPHORUS: 1.2 mg/dL — AB (ref 2.5–4.6)

## 2017-01-17 MED ORDER — K PHOS MONO-SOD PHOS DI & MONO 155-852-130 MG PO TABS
500.0000 mg | ORAL_TABLET | Freq: Three times a day (TID) | ORAL | Status: DC
  Administered 2017-01-17 – 2017-01-18 (×3): 500 mg via ORAL
  Filled 2017-01-17 (×6): qty 2

## 2017-01-17 MED ORDER — MAGNESIUM SULFATE 2 GM/50ML IV SOLN
2.0000 g | Freq: Once | INTRAVENOUS | Status: AC
  Administered 2017-01-17: 2 g via INTRAVENOUS
  Filled 2017-01-17: qty 50

## 2017-01-17 MED ORDER — ATENOLOL 50 MG PO TABS: 50.0000 mg | ORAL_TABLET | Freq: Every day | ORAL | Status: DC

## 2017-01-17 MED ORDER — INSULIN ASPART 100 UNIT/ML ~~LOC~~ SOLN
0.0000 [IU] | Freq: Every day | SUBCUTANEOUS | Status: DC
  Administered 2017-01-17: 4 [IU] via SUBCUTANEOUS

## 2017-01-17 MED ORDER — INSULIN GLARGINE 100 UNIT/ML ~~LOC~~ SOLN
10.0000 [IU] | Freq: Every day | SUBCUTANEOUS | Status: DC
  Administered 2017-01-17: 10 [IU] via SUBCUTANEOUS
  Filled 2017-01-17: qty 0.1

## 2017-01-17 MED ORDER — INSULIN ASPART 100 UNIT/ML ~~LOC~~ SOLN
0.0000 [IU] | Freq: Three times a day (TID) | SUBCUTANEOUS | Status: DC
  Administered 2017-01-17: 5 [IU] via SUBCUTANEOUS
  Administered 2017-01-17: 1 [IU] via SUBCUTANEOUS
  Administered 2017-01-18: 2 [IU] via SUBCUTANEOUS
  Administered 2017-01-18: 3 [IU] via SUBCUTANEOUS
  Administered 2017-01-18 – 2017-01-19 (×2): 1 [IU] via SUBCUTANEOUS

## 2017-01-17 MED ORDER — BENZONATATE 100 MG PO CAPS
200.0000 mg | ORAL_CAPSULE | Freq: Three times a day (TID) | ORAL | Status: DC | PRN
  Filled 2017-01-17: qty 2

## 2017-01-17 MED ORDER — GUAIFENESIN-DM 100-10 MG/5ML PO SYRP
5.0000 mL | ORAL_SOLUTION | ORAL | Status: DC | PRN
  Administered 2017-01-17 – 2017-01-18 (×3): 5 mL via ORAL
  Filled 2017-01-17 (×4): qty 5

## 2017-01-17 MED ORDER — SODIUM CHLORIDE 0.9 % IV SOLN
Freq: Once | INTRAVENOUS | Status: AC
  Administered 2017-01-17: 02:00:00 via INTRAVENOUS
  Filled 2017-01-17: qty 1000

## 2017-01-17 MED ORDER — ATENOLOL 12.5 MG HALF TABLET
12.5000 mg | ORAL_TABLET | Freq: Every day | ORAL | Status: DC
  Administered 2017-01-17 – 2017-01-18 (×2): 12.5 mg via ORAL
  Filled 2017-01-17 (×2): qty 1

## 2017-01-17 MED ORDER — INSULIN GLARGINE 100 UNIT/ML ~~LOC~~ SOLN
10.0000 [IU] | Freq: Once | SUBCUTANEOUS | Status: AC
  Administered 2017-01-17: 10 [IU] via SUBCUTANEOUS
  Filled 2017-01-17: qty 0.1

## 2017-01-17 MED ORDER — ENOXAPARIN SODIUM 40 MG/0.4ML ~~LOC~~ SOLN
40.0000 mg | SUBCUTANEOUS | Status: DC
  Administered 2017-01-17 – 2017-01-19 (×3): 40 mg via SUBCUTANEOUS
  Filled 2017-01-17 (×3): qty 0.4

## 2017-01-17 NOTE — Progress Notes (Signed)
Pt transferred to 5W.  A/O, no distress noted, denies pain, VS stable, Last blood Glucose is 125. Pt's belongings with pt at the bedside.

## 2017-01-17 NOTE — Progress Notes (Signed)
eLink Physician-Brief Progress Note Patient Name: Kayla Erickson DOB: 1981-01-03 MRN: [id number]   Date of Service  01/17/2017  HPI/Events of Note  Hypokalemia  eICU Interventions  Potassium replaced     Intervention Category Intermediate Interventions: Electrolyte abnormality - evaluation and management  DETERDING,ELIZABETH 01/17/2017, 2:02 AM

## 2017-01-17 NOTE — Progress Notes (Signed)
Report was given to 5W30. Pt and girlfriend (at the bedside) updated.

## 2017-01-17 NOTE — Progress Notes (Signed)
[place] TEAM 1 - Stepdown/ICU TEAM  Zachery Dakins  GNF:[id number] DOB: [birth year] DOA: 01/15/2017 PCP: Pcp Not In System    Brief Narrative:  36 y/o F with a history of DM on metformin who presented to the ED with dyspnea and was found to be in DKA. She reported a vague prodrome of possible fevers, but no dysuria, productive cough, or other signs of systemic infection.  Significant Events: 3/31 admit by PCCM in DKA 4/1 TR[facility] assumed care   Subjective: The pt is resting comfortably.  She denies cp, n/v, sob, or abdom pain.    Assessment & Plan:  DKA in DM2 DKA resolved but pt still on insulin gtt - CBG well controlled and gap closed w/ near normal bicarb - transition off insulin gtt - follow CBG - potential for d/c in 24hrs   Hypokalemia Due to DKA - replace and follow   Hypomagnesemia Due to DKA - replace and follow   Hypophosphatemia  Due to DKA - replace and follow   Acute bronchitis  PCCM intended to complete a 5 day course of cephalexin - I do not tx acute bronchitis w/ abx as it is a result of a viral infection the vast majority of the time - stop abx and follow for now - Flu screen negative   Acute kidney injury Pre-renal azotemia - resolved w/ volume expansion   Recent Labs Lab 01/16/17 1100 01/16/17 1529 01/16/17 1911 01/17/17 0016 01/17/17 0352  CREATININE 1.14* 1.15* 1.03* 0.83 0.77    DVT prophylaxis: lovenox  Code Status: FULL CODE Family Communication: no family present at time of exam  Disposition Plan: transition off insulin gtt then transfer to med bed - home when CBG controlled off insulin gtt and tolerating oral intake (possibly 4/2)  Consultants:  PCCM  Antimicrobials:  Zosyn 3/30 Vanc 3/30 Cephalexin 3/31 > 4/1  Objective: Blood pressure 107/78, pulse 95, temperature 98.3 F (36.8 C), temperature source Oral, resp. rate 18, height  (1.6 m), weight 70 kg (154 lb 5.2 oz), last menstrual period 12/25/2016, SpO2 98 %.  Intake/Output  Summary (Last 24 hours) at 01/17/17 0858 Last data filed at 01/17/17 0700  Gross per 24 hour  Intake          5020.32 ml  Output              900 ml  Net          4120.32 ml   Filed Weights   01/15/17 2332 01/16/17 0100  Weight: 65.3 kg (144 lb) 70 kg (154 lb 5.2 oz)    Examination: General: No acute respiratory distress Lungs: Clear to auscultation bilaterally without wheezes or crackles Cardiovascular: Regular rate and rhythm without murmur gallop or rub normal S1 and S2 Abdomen: Nontender, nondistended, soft, bowel sounds positive, no rebound, no ascites, no appreciable mass Extremities: No significant cyanosis, clubbing, or edema bilateral lower extremities  CBC:  Recent Labs Lab 01/15/17 2012 01/16/17 0002  WBC 13.0* 9.4  HGB 16.1* 14.0  HCT 46.3* 41.2  MCV 92.6 94.5  PLT 283 200   Basic Metabolic Panel:  Recent Labs Lab 01/16/17 1100 01/16/17 1108 01/16/17 1529 01/16/17 1911 01/17/17 0016 01/17/17 0352  NA 136  --  136 137 134* 139  K 2.8*  --  3.0* 3.6 2.9* 3.2*  CL 112*  --  112* 111 110 111  CO2 15*  --  11* 15* 17* 18*  GLUCOSE 171*  --  214* 207* 118* 142*  BUN 9  --  8 7 5* <5*  CREATININE 1.14*  --  1.15* 1.03* 0.83 0.77  CALCIUM 7.0*  --  7.1* 7.4* 7.4* 7.3*  MG  --  0.6*  --   --   --  1.4*  PHOS  --  <1.0*  --   --   --  1.2*   GFR: Estimated Creatinine Clearance: 92.9 mL/min (by C-G formula based on SCr of 0.77 mg/dL).  Liver Function Tests:  Recent Labs Lab 01/16/17 0002  AST 22  ALT 32  ALKPHOS 115  BILITOT 2.0*  PROT 6.7  ALBUMIN 3.5    Coagulation Profile:  Recent Labs Lab 01/16/17 0002  INR 1.21    Cardiac Enzymes:  Recent Labs Lab 01/16/17 0002  TROPONINI <0.03    HbA1C: Hemoglobin A1C  Date/Time Value Ref Range Status  12/06/2012 02:47 PM 9.1  Final  12/28/2011 02:55 PM 7.4  Final   Hgb A1c MFr Bld  Date/Time Value Ref Range Status  08/05/2010 02:54 PM 5.9 %   03/31/2010 09:42 AM 10.8 %      CBG:  Recent Labs Lab 01/17/17 0301 01/17/17 0357 01/17/17 0509 01/17/17 0554 01/17/17 0712  GLUCAP 126* 140* 145* 150* 157*    Recent Results (from the past 240 hour(s))  MRSA PCR Screening     Status: None   Collection Time: 01/16/17  1:15 AM  Result Value Ref Range Status   MRSA by PCR NEGATIVE NEGATIVE Final    Comment:        The GeneXpert MRSA Assay (FDA approved for NASAL specimens only), is one component of a comprehensive MRSA colonization surveillance program. It is not intended to diagnose MRSA infection nor to guide or monitor treatment for MRSA infections.      Scheduled Meds: . cephALEXin  500 mg Oral Q8H  . heparin  5,000 Units Subcutaneous Q8H   Continuous Infusions: . sodium chloride Stopped (01/16/17 0235)  . sodium chloride Stopped (01/16/17 0359)  . dextrose 5 % and 0.45% NaCl    . dextrose 5 % and 0.45% NaCl 125 mL/hr at 01/17/17 0555  . insulin (NOVOLIN-R) infusion 2 Units/hr (01/17/17 0813)     LOS: 2 days   Lonia Blood, MD [facility] Office  [phone] Pager - Text Page per Amion as per below:  On-Call/Text Page:      [email or web address]      password [email or web address]  If 7PM-7AM, please contact night-coverage [email or web address] Password [email or web address] 01/17/2017, 8:58 AM

## 2017-01-18 LAB — BASIC METABOLIC PANEL
Anion gap: 9 (ref 5–15)
BUN: 5 mg/dL — AB (ref 6–20)
CO2: 22 mmol/L (ref 22–32)
Calcium: 7.4 mg/dL — ABNORMAL LOW (ref 8.9–10.3)
Chloride: 108 mmol/L (ref 101–111)
Creatinine, Ser: 0.73 mg/dL (ref 0.44–1.00)
GFR calc Af Amer: >60 mL/min (ref >60)
GLUCOSE: 155 mg/dL — AB (ref 65–99)
POTASSIUM: 2.8 mmol/L — AB (ref 3.5–5.1)
Sodium: 139 mmol/L (ref 135–145)

## 2017-01-18 LAB — GLUCOSE, CAPILLARY
GLUCOSE-CAPILLARY: 246 mg/dL — AB (ref 65–99)
GLUCOSE-CAPILLARY: 197 mg/dL — AB (ref 65–99)
GLUCOSE-CAPILLARY: 197 mg/dL — AB (ref 65–99)
Glucose-Capillary: 149 mg/dL — ABNORMAL HIGH (ref 65–99)
Glucose-Capillary: 143 mg/dL — ABNORMAL HIGH (ref 65–99)
Glucose-Capillary: 134 mg/dL — ABNORMAL HIGH (ref 65–99)

## 2017-01-18 LAB — PHOSPHORUS: Phosphorus: 2.2 mg/dL — ABNORMAL LOW (ref 2.5–4.6)

## 2017-01-18 LAB — MAGNESIUM: Magnesium: 1.2 mg/dL — ABNORMAL LOW (ref 1.7–2.4)

## 2017-01-18 MED ORDER — POTASSIUM CHLORIDE CRYS ER 20 MEQ PO TBCR
40.0000 meq | EXTENDED_RELEASE_TABLET | Freq: Two times a day (BID) | ORAL | Status: DC
  Administered 2017-01-18 (×2): 40 meq via ORAL
  Filled 2017-01-18 (×3): qty 2

## 2017-01-18 MED ORDER — MAGNESIUM SULFATE 2 GM/50ML IV SOLN
2.0000 g | Freq: Once | INTRAVENOUS | Status: AC
  Administered 2017-01-18: 2 g via INTRAVENOUS
  Filled 2017-01-18: qty 50

## 2017-01-18 MED ORDER — METFORMIN HCL 500 MG PO TABS
1000.0000 mg | ORAL_TABLET | Freq: Two times a day (BID) | ORAL | Status: DC
  Administered 2017-01-18 – 2017-01-19 (×2): 1000 mg via ORAL
  Filled 2017-01-18 (×2): qty 2

## 2017-01-18 MED ORDER — INSULIN STARTER KIT- SYRINGES (ENGLISH)
1.0000 | Freq: Once | Status: AC
  Administered 2017-01-18: 1
  Filled 2017-01-18: qty 1

## 2017-01-18 MED ORDER — LIVING WELL WITH DIABETES BOOK
Freq: Once | Status: AC
  Administered 2017-01-18: 15:00:00
  Filled 2017-01-18: qty 1

## 2017-01-18 MED ORDER — ATENOLOL 50 MG PO TABS
25.0000 mg | ORAL_TABLET | Freq: Every day | ORAL | Status: DC
  Administered 2017-01-19: 25 mg via ORAL
  Filled 2017-01-18: qty 1

## 2017-01-18 MED ORDER — INSULIN ASPART PROT & ASPART (70-30 MIX) 100 UNIT/ML ~~LOC~~ SUSP
20.0000 [IU] | Freq: Two times a day (BID) | SUBCUTANEOUS | Status: DC
  Administered 2017-01-18 – 2017-01-19 (×2): 20 [IU] via SUBCUTANEOUS
  Filled 2017-01-18: qty 10

## 2017-01-18 NOTE — Progress Notes (Signed)
[place] TEAM 1 - Stepdown/ICU TEAM  Zachery Dakins  WUJ:[id number] DOB: [birth year] DOA: 01/15/2017 PCP: Pcp Not In System    Brief Narrative:  36 y/o F with a history of DM on metformin who presented to the ED with dyspnea and was found to be in DKA. She reported a vague prodrome of possible fevers, but no dysuria, productive cough, or other signs of systemic infection.  Significant Events: 3/31 admit by PCCM in DKA 4/1 [facility] assumed care   Subjective: The patient is alert oriented and conversant.  She denies chest pain shortness breath fevers chills nausea or vomiting.  Her CBGs have been quite variable.  She reports to me that she has in fact been on insulin in the past and was supposed to be on insulin prior to her admission.  Her previous physician was providing her w/ Trulicity injections as samples, as the pt has no insurance.  Unfortunately that physician had to close their practice and the patient therefore lost access to Trulicity.  As a result she was attempting to control her CBGs with Glucophage alone.  She reports that she has been in DKA before.  Her CBGs remain quite variable at this time.    Assessment & Plan:  DKA in DM2 DKA resolved but CBG quite variable - pt appears to be insulin dependent to keep her out of DKA, but she has no insurance or adequate finances to afford her medications - will initiate 70/30 insulin for cost-effectiveness - need to assure CBG is stable prior to d/c or she will be at great risk for rapidly recurring DKA   Hypokalemia Due to DKA - cont to replace   Hypomagnesemia Due to DKA - cont to replace   Hypophosphatemia  Due to DKA - improved - stop supplementation and f/u in AM   Acute bronchitis  PCCM intended to complete a 5 day course of cephalexin - I do not tx acute bronchitis w/ abx as it is a result of a viral infection the vast majority of the time - stopped abx - Flu screen negative - clinically resolved   Acute kidney injury Pre-renal  azotemia - resolved w/ volume expansion   Recent Labs Lab 01/16/17 1911 01/17/17 0016 01/17/17 0352 01/17/17 1502 01/18/17 0417  CREATININE 1.03* 0.83 0.77 1.03* 0.73    DVT prophylaxis: lovenox  Code Status: FULL CODE Family Communication: no family present at time of exam  Disposition Plan:  Need to assure CBG stable and multiple electrolyte abnormalities corrected prior to d/c home   Consultants:  PCCM  Antimicrobials:  Zosyn 3/30 Vanc 3/30 Cephalexin 3/31 > 4/1  Objective: Blood pressure 126/89, pulse (!) 101, temperature 98.1 F (36.7 C), temperature source Oral, resp. rate 18, height  (1.6 m), weight 70 kg (154 lb 5.2 oz), last menstrual period 12/25/2016, SpO2 100 %.  Intake/Output Summary (Last 24 hours) at 01/18/17 1402 Last data filed at 01/18/17 0500  Gross per 24 hour  Intake          1331.67 ml  Output                0 ml  Net          1331.67 ml   Filed Weights   01/15/17 2332 01/16/17 0100  Weight: 65.3 kg (144 lb) 70 kg (154 lb 5.2 oz)    Examination: General: No acute respiratory distress - alert and oriented  Lungs: Clear to auscultation bilaterally Cardiovascular: Regular rate and rhythm without  murmur  Abdomen: Nontender, nondistended, soft, bowel sounds positive, no rebound, no ascites, no appreciable mass Extremities: No significant edema bilateral lower extremities  CBC:  Recent Labs Lab 01/15/17 2012 01/16/17 0002  WBC 13.0* 9.4  HGB 16.1* 14.0  HCT 46.3* 41.2  MCV 92.6 94.5  PLT 283 200   Basic Metabolic Panel:  Recent Labs Lab 01/16/17 1108  01/16/17 1911 01/17/17 0016 01/17/17 0352 01/17/17 1502 01/18/17 0417  NA  --   < > 137 134* 139 137 139  K  --   < > 3.6 2.9* 3.2* 3.5 2.8*  CL  --   < > 111 110 111 109 108  CO2  --   < > 15* 17* 18* 16* 22  GLUCOSE  --   < > 207* 118* 142* 257* 155*  BUN  --   < > 7 5* <5* <5* 5*  CREATININE  --   < > 1.03* 0.83 0.77 1.03* 0.73  CALCIUM  --   < > 7.4* 7.4* 7.3* 7.6*  7.4*  MG 0.6*  --   --   --  1.4*  --  1.2*  PHOS <1.0*  --   --   --  1.2*  --  2.2*  < > = values in this interval not displayed. GFR: Estimated Creatinine Clearance: 92.9 mL/min (by C-G formula based on SCr of 0.73 mg/dL).  Liver Function Tests:  Recent Labs Lab 01/16/17 0002  AST 22  ALT 32  ALKPHOS 115  BILITOT 2.0*  PROT 6.7  ALBUMIN 3.5    Coagulation Profile:  Recent Labs Lab 01/16/17 0002  INR 1.21    Cardiac Enzymes:  Recent Labs Lab 01/16/17 0002  TROPONINI <0.03    HbA1C: Hemoglobin A1C  Date/Time Value Ref Range Status  12/06/2012 02:47 PM 9.1  Final  12/28/2011 02:55 PM 7.4  Final   Hgb A1c MFr Bld  Date/Time Value Ref Range Status  08/05/2010 02:54 PM 5.9 %   03/31/2010 09:42 AM 10.8 %     CBG:  Recent Labs Lab 01/17/17 2033 01/18/17 0103 01/18/17 0430 01/18/17 0811 01/18/17 1303  GLUCAP 324* 149* 143* 134* 246*    Recent Results (from the past 240 hour(s))  Blood culture (routine x 2)     Status: None (Preliminary result)   Collection Time: 01/15/17 11:00 PM  Result Value Ref Range Status   Specimen Description BLOOD RIGHT HAND  Final   Special Requests BOTTLES DRAWN AEROBIC AND ANAEROBIC  Final   Culture NO GROWTH 2 DAYS  Final   Report Status PENDING  Incomplete  Blood culture (routine x 2)     Status: None (Preliminary result)   Collection Time: 01/15/17 11:05 PM  Result Value Ref Range Status   Specimen Description BLOOD LEFT HAND  Final   Special Requests IN PEDIATRIC BOTTLE  Final   Culture NO GROWTH 2 DAYS  Final   Report Status PENDING  Incomplete  MRSA PCR Screening     Status: None   Collection Time: 01/16/17  1:15 AM  Result Value Ref Range Status   MRSA by PCR NEGATIVE NEGATIVE Final    Comment:        The GeneXpert MRSA Assay (FDA approved for NASAL specimens only), is one component of a comprehensive MRSA colonization surveillance program. It is not intended to diagnose MRSA infection nor to  guide or monitor treatment for MRSA infections.      Scheduled Meds: . atenolol  12.5  mg Oral Daily  . enoxaparin (LOVENOX) injection  40 mg Subcutaneous Q24H  . insulin aspart  0-5 Units Subcutaneous QHS  . insulin aspart  0-9 Units Subcutaneous TID WC  . insulin glargine  10 Units Subcutaneous QHS  . metFORMIN  1,000 mg Oral BID WC  . phosphorus  500 mg Oral TID   Continuous Infusions: . sodium chloride 100 mL/hr at 01/18/17 1331     LOS: 3 days   Lonia Blood, MD [facility] Office  [phone] Pager - Text Page per Loretha Stapler as per below:  On-Call/Text Page:      [email or web address]      password [email or web address]  If 7PM-7AM, please contact night-coverage [email or web address] Password [email or web address] 01/18/2017, 2:02 PM

## 2017-01-18 NOTE — Progress Notes (Signed)
Inpatient Diabetes Program Recommendations  AACE/ADA: New Consensus Statement on Inpatient Glycemic Control (2015)  Target Ranges:  Prepandial:   less than 140 mg/dL      Peak postprandial:   less than 180 mg/dL (1-2 hours)      Critically ill patients:  140 - 180 mg/dL   Results for Kayla Erickson, Kayla Erickson (MRN [id number]) as of 01/18/2017 15:49  Ref. Range 01/17/2017 09:30 01/17/2017 12:16 01/17/2017 17:03 01/17/2017 20:33 01/18/2017 01:03 01/18/2017 04:30 01/18/2017 08:11  Glucose-Capillary Latest Ref Range: 65 - 99 mg/dL 169 (H) 125 (H) 264 (H) 324 (H) 149 (H) 143 (H) 134 (H)   Review of Glycemic Control  Diabetes history: DM Outpatient Diabetes medications: Metformin 1000 mg BID Current orders for Inpatient glycemic control: 70/30 20 units BID, Novolog 0-9 units TID with meals, Novolog 0-5 units QHS, Metformin 1000 mg BID  Recommendation: A1C: Please add on an A1C to blood in lab is available or draw with next scheduled blood draw.  NOTE: Noted consult for diabetes coordinator. Spoke with patient about diabetes and home regimen for diabetes control. Patient reports that she does not currently have a PCP for diabetes management and currently she takes Metformin 1000 mg BID as an outpatient for diabetes control. Patient reports that she was taking Trulicity when she was able to get samples from her doctor (which has closed) and her glucose was trending in the 100's mg/dl when she was taking the Trulicity. Patient also reports that she was prescribed and took 70/30 insulin about 11 years ago when she was first dx with DM but she was able to come off insulin.  Patient states that she checks her glucose 2 times per day and she gets her testing supplies at [facility] which are affordable for her. Discussed Reli-On products available at [facility] and provided handout information on Reli-On products. Patient reports that the testing supplies she is getting at [facility] are about the same cost.   Discussed glucose and A1C goals. Discussed  importance of checking CBGs and maintaining good CBG control to prevent long-term and short-term complications. Explained how hyperglycemia leads to damage within blood vessels which lead to the common complications seen with uncontrolled diabetes. Stressed to the patient the importance of improving glycemic control to prevent further complications from uncontrolled diabetes. Discussed impact of nutrition, exercise, stress, sickness, and medications on diabetes control. Informed patient that he will be prescribed Novolin 70/30 since it is more affordable. Informed patient that Novolin 70/30 can be purchased at [facility] for $25 per vial. Discussed 70/30 insulin in detail (how to take it, when to take it).  Reviewed and demonstrated how to draw up and administer insulin with vial and syringe. Patient was able to successfully demonstrate how to draw up and administer insulin with vial and syringe. Informed patient that RN will be asking her to self-administer insulin to ensure proper technique and ability to administer self insulin shots. Informed patient she should be receiving an insulin starter kit and a Living Well with Diabetes booklet from the nurse. Encouraged patient to read the entire book and kit. Reviewed signs and symptoms of hyperglycemia and hypoglycemia along with treatment for both. Patient reports that she has experienced hypoglycemia before and she is able to recognize it when it occurs. Noted patient has consult for CM. Patient will need follow up care set up and may need medication assistance.  Encouraged patient to check her glucose 4 times per day (before meals and at bedtime) and to keep a log book of  glucose readings and insulin taken which she will need to take to doctor appointments. Explained how the doctor she follows up with can use the log book to continue to make insulin adjustments if needed. Patient verbalized understanding of information discussed and she states that she has no  further questions at this time related to diabetes.  Thanks, Barnie Alderman, RN, MSN, CDE Diabetes Coordinator Inpatient Diabetes Program [phone] (Team Pager)

## 2017-01-19 DIAGNOSIS — E876 Hypokalemia: Secondary | ICD-10-CM

## 2017-01-19 LAB — MAGNESIUM
MAGNESIUM: 1.3 mg/dL — AB (ref 1.7–2.4)
Magnesium: 1.9 mg/dL (ref 1.7–2.4)

## 2017-01-19 LAB — PHOSPHORUS: PHOSPHORUS: 2.3 mg/dL — AB (ref 2.5–4.6)

## 2017-01-19 LAB — BASIC METABOLIC PANEL
ANION GAP: 9 (ref 5–15)
BUN: 5 mg/dL — ABNORMAL LOW (ref 6–20)
CHLORIDE: 106 mmol/L (ref 101–111)
CO2: 28 mmol/L (ref 22–32)
Calcium: 8.1 mg/dL — ABNORMAL LOW (ref 8.9–10.3)
Creatinine, Ser: 0.74 mg/dL (ref 0.44–1.00)
GFR calc non Af Amer: >60 mL/min (ref >60)
Glucose, Bld: 134 mg/dL — ABNORMAL HIGH (ref 65–99)
POTASSIUM: 3 mmol/L — AB (ref 3.5–5.1)
SODIUM: 143 mmol/L (ref 135–145)

## 2017-01-19 LAB — GLUCOSE, CAPILLARY
GLUCOSE-CAPILLARY: 134 mg/dL — AB (ref 65–99)
GLUCOSE-CAPILLARY: 138 mg/dL — AB (ref 65–99)
GLUCOSE-CAPILLARY: 181 mg/dL — AB (ref 65–99)
GLUCOSE-CAPILLARY: 304 mg/dL — AB (ref 65–99)
Glucose-Capillary: 93 mg/dL (ref 65–99)

## 2017-01-19 LAB — HEMOGLOBIN A1C
Hgb A1c MFr Bld: >15.5 % — ABNORMAL HIGH (ref 4.8–5.6)
Mean Plasma Glucose: >398 mg/dL

## 2017-01-19 LAB — POTASSIUM: Potassium: 3.8 mmol/L (ref 3.5–5.1)

## 2017-01-19 MED ORDER — SODIUM CHLORIDE 0.9 % IV SOLN
30.0000 meq | Freq: Once | INTRAVENOUS | Status: AC
  Administered 2017-01-19: 30 meq via INTRAVENOUS
  Filled 2017-01-19: qty 15

## 2017-01-19 MED ORDER — INSULIN SYRINGES (DISPOSABLE) U-100 0.3 ML MISC: 1.0000 [IU] | Freq: Two times a day (BID) | 0 refills | Status: DC

## 2017-01-19 MED ORDER — INSULIN ASPART PROT & ASPART (70-30 MIX) 100 UNIT/ML ~~LOC~~ SUSP: 20.0000 [IU] | Freq: Two times a day (BID) | SUBCUTANEOUS | 0 refills | Status: DC

## 2017-01-19 MED ORDER — POTASSIUM CHLORIDE CRYS ER 20 MEQ PO TBCR: 20.0000 meq | EXTENDED_RELEASE_TABLET | Freq: Every day | ORAL | 0 refills | Status: DC

## 2017-01-19 MED ORDER — ATENOLOL 50 MG PO TABS: 50.0000 mg | ORAL_TABLET | Freq: Every day | ORAL | 0 refills | Status: DC

## 2017-01-19 MED ORDER — MAGNESIUM SULFATE 2 GM/50ML IV SOLN
2.0000 g | Freq: Once | INTRAVENOUS | Status: AC
Start: 2017-01-19 — End: 2017-01-19
  Administered 2017-01-19: 2 g via INTRAVENOUS
  Filled 2017-01-19: qty 50

## 2017-01-19 MED ORDER — POTASSIUM CHLORIDE CRYS ER 20 MEQ PO TBCR
40.0000 meq | EXTENDED_RELEASE_TABLET | Freq: Two times a day (BID) | ORAL | Status: DC
  Administered 2017-01-19: 40 meq via ORAL

## 2017-01-19 NOTE — Progress Notes (Signed)
Discharge instruction given and reviewed patient verbalized understating using teach back. Prescriptions given to patient in discharge envelope.  Patient understand to follow up with [facility].

## 2017-01-19 NOTE — Progress Notes (Signed)
Post hospital follow up appointment scheduled for 01/21/2017 at 9:30 am with Scot Jun NP by CM . CM to provide [facility] information and make pt aware. Gae Gallop RN,BSN,CM

## 2017-01-19 NOTE — Progress Notes (Signed)
Inpatient Diabetes Program Recommendations  AACE/ADA: New Consensus Statement on Inpatient Glycemic Control (2015)  Target Ranges:  Prepandial:   less than 140 mg/dL      Peak postprandial:   less than 180 mg/dL (1-2 hours)      Critically ill patients:  140 - 180 mg/dL   Lab Results  Component Value Date   GLUCAP 93 01/19/2017   HGBA1C >15.5 (H) 01/18/2017    Review of Glycemic Control  Inpatient Diabetes Program Recommendations:  Spoke with patient @ bedside regarding A1c of >15.5. Patient states she understands her CBGs have been elevated but surprised in the elevation of the A1c. Gave handout on A1c information. Patient states she has given herself injections by syringe in the past and feels comfortable now with administering insulin and has been tolerating Meformin. Reviewed with patient to keep MD appts, take log of CBGs or meter, and review with MDs.  Thank you, Kayla Erickson. Kayla Aylesworth, RN, MSN, CDE Inpatient Glycemic Control Team Team Pager [phone] (8am-5pm) 01/19/2017 3:23 PM

## 2017-01-19 NOTE — Discharge Summary (Signed)
Physician Discharge Summary  Kayla Erickson:[id number] DOB: Mar 02, 1981 DOA: 01/15/2017  PCP: Pcp Not In System  Admit date: 01/15/2017 Discharge date: 01/19/2017  Admitted From:home Disposition:home  Recommendations for Outpatient Follow-up:  1. Follow up with PCP in 1-2 weeks 2. Please obtain BMP/CBC in one week   Home Health:no Equipment/Devices:no Discharge Condition:stable CODE STATUS:full Diet recommendation:carb modified heart healthy diet  Brief/Interim Summary: 36 year old female with history of diabetes on metformin, presented with dyspnea when she was found to have diabetic ketoacidosis without coma. Patient was initially admitted by pulmonary critical care team. She was treated with IV fluid, IV insulin with improvement of blood sugar level unless primary care. Patient now clinically improved. Acute kidney injury, hypokalemia and hypomagnesemia improved. Initially treated with antibiotics for acute bronchitis which was later discontinued because of possible viral etiology.  Patient was discharged with oral potassium chloride and recommended to monitor labs. She will be discharged with oral metformin and insulin 70/30   20 units twice a day. Evaluated by diabetic educator. I discussed with the case manager regarding follow-up appointment since patient has no PCP.  I advised patient to monitor blood sugar level. She said she has supplies at home.  At this time, patient is medically stable to go home with outpatient follow-up.   Discharge Diagnoses:  Active Problems:   DKA (diabetic ketoacidoses) (HCC)   Electrolyte imbalance   Acute bronchitis    Discharge Instructions  Discharge Instructions    Call MD for:  difficulty breathing, headache or visual disturbances    Complete by:  As directed    Call MD for:  extreme fatigue    Complete by:  As directed    Call MD for:  hives    Complete by:  As directed    Call MD for:  persistant dizziness or light-headedness     Complete by:  As directed    Call MD for:  persistant nausea and vomiting    Complete by:  As directed    Call MD for:  severe uncontrolled pain    Complete by:  As directed    Call MD for:  temperature >100.4    Complete by:  As directed    Diet - low sodium heart healthy    Complete by:  As directed    Diet Carb Modified    Complete by:  As directed    Discharge instructions    Complete by:  As directed    Please follow up with your PCP and monitor blood sugar level at home.   Increase activity slowly    Complete by:  As directed      Allergies as of 01/19/2017   No Known Allergies     Medication List    STOP taking these medications   lisinopril 20 MG tablet Commonly known as:  PRINIVIL,ZESTRIL     TAKE these medications   atenolol 50 MG tablet Commonly known as:  TENORMIN Take 1 tablet (50 mg total) by mouth daily. What changed:  when to take this   insulin aspart protamine- aspart (70-30) 100 UNIT/ML injection Commonly known as:  NOVOLOG MIX 70/30 Inject 0.2 mLs (20 Units total) into the skin 2 (two) times daily with a meal.   Insulin Syringes (Disposable) U-100 0.3 ML Misc 1 Units by Does not apply route 2 (two) times daily.   metFORMIN 1000 MG tablet Commonly known as:  GLUCOPHAGE TAKE ONE TABLET by mouth TWICE DAILY WITH MEALS   potassium chloride SA 20  MEQ tablet Commonly known as:  K-DUR,KLOR-CON Take 1 tablet (20 mEq total) by mouth daily.      Follow-up Information    [facility] Follow up on 01/21/2017.   Why:  Post hospital follow up appointment scheduled for 01/21/2017 at 9:30 am with Scot Jun NP Contact information: [address] [place] [address] [phone]         No Known Allergies  Consultations: Initiating admitted by PCCM  Procedures/Studies: None Subjective: Patient was seen and examined at bedside. She reported doing much better. Denied nausea, vomiting, chest pain,  shortness of breath, cough, abdominal pain. Able to tolerate diet well.  Discharge Exam: Vitals:   01/19/17 0414 01/19/17 0851  BP: 111/79 (!) 130/97  Pulse: 93 97  Resp: 18   Temp: 98.2 F (36.8 C)    Vitals:   01/18/17 1601 01/18/17 2048 01/19/17 0414 01/19/17 0851  BP: 117/82 106/69 111/79 (!) 130/97  Pulse: 94 100 93 97  Resp: Temp: 98.2 F (36.8 C) 98.3 F (36.8 C) 98.2 F (36.8 C)   TempSrc: Oral Oral    SpO2: 99% 98% 98%   Weight:      Height:        General: Pt is alert, awake, not in acute distress Cardiovascular: RRR, S1/S2 +, no rubs, no gallops Respiratory: CTA bilaterally, no wheezing, no rhonchi Abdominal: Soft, NT, ND, bowel sounds + Extremities: no edema, no cyanosis    The results of significant diagnostics from this hospitalization (including imaging, microbiology, ancillary and laboratory) are listed below for reference.     Microbiology: Recent Results (from the past 240 hour(s))  Blood culture (routine x 2)     Status: None (Preliminary result)   Collection Time: 01/15/17 11:00 PM  Result Value Ref Range Status   Specimen Description BLOOD RIGHT HAND  Final   Special Requests BOTTLES DRAWN AEROBIC AND ANAEROBIC  Final   Culture NO GROWTH 3 DAYS  Final   Report Status PENDING  Incomplete  Blood culture (routine x 2)     Status: None (Preliminary result)   Collection Time: 01/15/17 11:05 PM  Result Value Ref Range Status   Specimen Description BLOOD LEFT HAND  Final   Special Requests IN PEDIATRIC BOTTLE  Final   Culture NO GROWTH 3 DAYS  Final   Report Status PENDING  Incomplete  MRSA PCR Screening     Status: None   Collection Time: 01/16/17  1:15 AM  Result Value Ref Range Status   MRSA by PCR NEGATIVE NEGATIVE Final    Comment:        The GeneXpert MRSA Assay (FDA approved for NASAL specimens only), is one component of a comprehensive MRSA colonization surveillance program. It is not intended to diagnose  MRSA infection nor to guide or monitor treatment for MRSA infections.      Labs: BNP (last 3 results) No results for input(s): BNP in the last 8760 hours. Basic Metabolic Panel:  Recent Labs Lab 01/16/17 1108  01/17/17 0016 01/17/17 0352 01/17/17 1502 01/18/17 0417 01/19/17 0554 01/19/17 1245  NA  --   < > 134* 139 137 139 143  --   K  --   < > 2.9* 3.2* 3.5 2.8* 3.0* 3.8  CL  --   < > 110 111 109 108 106  --   CO2  --   < > 17* 18* 16* 22 28  --  GLUCOSE  --   < > 118* 142* 257* 155* 134*  --   BUN  --   < > 5* <5* <5* 5* 5*  --   CREATININE  --   < > 0.83 0.77 1.03* 0.73 0.74  --   CALCIUM  --   < > 7.4* 7.3* 7.6* 7.4* 8.1*  --   MG 0.6*  --   --  1.4*  --  1.2* 1.3* 1.9  PHOS <1.0*  --   --  1.2*  --  2.2* 2.3*  --   < > = values in this interval not displayed. Liver Function Tests:  Recent Labs Lab 01/16/17 0002  AST 22  ALT 32  ALKPHOS 115  BILITOT 2.0*  PROT 6.7  ALBUMIN 3.5   No results for input(s): LIPASE, AMYLASE in the last 168 hours. No results for input(s): AMMONIA in the last 168 hours. CBC:  Recent Labs Lab 01/15/17 2012 01/16/17 0002  WBC 13.0* 9.4  HGB 16.1* 14.0  HCT 46.3* 41.2  MCV 92.6 94.5  PLT 283 200   Cardiac Enzymes:  Recent Labs Lab 01/16/17 0002  TROPONINI <0.03   BNP: Invalid input(s): POCBNP CBG:  Recent Labs Lab 01/18/17 2045 01/19/17 0022 01/19/17 0412 01/19/17 0742 01/19/17 1136  GLUCAP 197* 181* 138* 134* 93   D-Dimer No results for input(s): DDIMER in the last 72 hours. Hgb A1c  Recent Labs  01/18/17 1604  HGBA1C >15.5*   Lipid Profile No results for input(s): CHOL, HDL, LDLCALC, TRIG, CHOLHDL, LDLDIRECT in the last 72 hours. Thyroid function studies No results for input(s): TSH, T4TOTAL, T3FREE, THYROIDAB in the last 72 hours.  Invalid input(s): FREET3 Anemia work up No results for input(s): VITAMINB12, FOLATE, FERRITIN, TIBC, IRON, RETICCTPCT in the last 72 hours. Urinalysis     Component Value Date/Time   COLORURINE STRAW (A) 01/15/2017 2240   APPEARANCEUR CLEAR 01/15/2017 2240   LABSPEC 1.017 01/15/2017 2240   PHURINE 5.0 01/15/2017 2240   GLUCOSEU >=500 (A) 01/15/2017 2240   HGBUR SMALL (A) 01/15/2017 2240   BILIRUBINUR NEGATIVE 01/15/2017 2240   KETONESUR 80 (A) 01/15/2017 2240   PROTEINUR 30 (A) 01/15/2017 2240   UROBILINOGEN 0.2 07/31/2012 0038   NITRITE NEGATIVE 01/15/2017 2240   LEUKOCYTESUR NEGATIVE 01/15/2017 2240   Sepsis Labs Invalid input(s): PROCALCITONIN,  WBC,  LACTICIDVEN Microbiology Recent Results (from the past 240 hour(s))  Blood culture (routine x 2)     Status: None (Preliminary result)   Collection Time: 01/15/17 11:00 PM  Result Value Ref Range Status   Specimen Description BLOOD RIGHT HAND  Final   Special Requests BOTTLES DRAWN AEROBIC AND ANAEROBIC  Final   Culture NO GROWTH 3 DAYS  Final   Report Status PENDING  Incomplete  Blood culture (routine x 2)     Status: None (Preliminary result)   Collection Time: 01/15/17 11:05 PM  Result Value Ref Range Status   Specimen Description BLOOD LEFT HAND  Final   Special Requests IN PEDIATRIC BOTTLE  Final   Culture NO GROWTH 3 DAYS  Final   Report Status PENDING  Incomplete  MRSA PCR Screening     Status: None   Collection Time: 01/16/17  1:15 AM  Result Value Ref Range Status   MRSA by PCR NEGATIVE NEGATIVE Final    Comment:        The GeneXpert MRSA Assay (FDA approved for NASAL specimens only), is one component of a comprehensive MRSA colonization surveillance  program. It is not intended to diagnose MRSA infection nor to guide or monitor treatment for MRSA infections.      Time coordinating discharge: 28 minutes  SIGNED:   Maxie Barb, MD  [facility] 01/19/2017, 1:58 PM  If 7PM-7AM, please contact night-coverage [email or web address] Password [email or web address]

## 2017-01-19 NOTE — Care Management Note (Signed)
Case Management Note  Patient Details  Name: Kayla Erickson MRN: [id number] Date of Birth: 1981/04/30  Subjective/Objective:           Admitted with DKA.        Action/Plan: Plan is to d/c to home to day with hospital follow scheduled @ the [facility]. Pt states she can afford discharging prescription and will f/u with [facility] pharmacy if needs present.  Expected Discharge Date:  01/19/17               Expected Discharge Plan:  Home/Self Care  In-House Referral:     Discharge planning Services  CM Consult, [facility] appt scheduled, [facility]   Status of Service:  Completed, signed off  If discussed at Long Length of Stay Meetings, dates discussed:    Additional Comments:  Epifanio Lesches, RN 01/19/2017, 2:45 PM

## 2017-01-21 ENCOUNTER — Ambulatory Visit: Payer: Self-pay | Attending: Internal Medicine | Admitting: Physician Assistant

## 2017-01-21 VITALS — BP 133/86 | HR 98 | Temp 98.1°F | Resp 18 | Ht 63.0 in | Wt 153.8 lb

## 2017-01-21 DIAGNOSIS — Z91199 Patient's noncompliance with other medical treatment and regimen due to unspecified reason: Secondary | ICD-10-CM

## 2017-01-21 DIAGNOSIS — Z9114 Patient's other noncompliance with medication regimen: Secondary | ICD-10-CM | POA: Insufficient documentation

## 2017-01-21 DIAGNOSIS — I10 Essential (primary) hypertension: Secondary | ICD-10-CM

## 2017-01-21 DIAGNOSIS — Z09 Encounter for follow-up examination after completed treatment for conditions other than malignant neoplasm: Secondary | ICD-10-CM

## 2017-01-21 DIAGNOSIS — Z9119 Patient's noncompliance with other medical treatment and regimen: Secondary | ICD-10-CM

## 2017-01-21 DIAGNOSIS — Z794 Long term (current) use of insulin: Secondary | ICD-10-CM | POA: Insufficient documentation

## 2017-01-21 DIAGNOSIS — E1110 Type 2 diabetes mellitus with ketoacidosis without coma: Secondary | ICD-10-CM | POA: Insufficient documentation

## 2017-01-21 DIAGNOSIS — E0810 Diabetes mellitus due to underlying condition with ketoacidosis without coma: Secondary | ICD-10-CM

## 2017-01-21 DIAGNOSIS — Z79899 Other long term (current) drug therapy: Secondary | ICD-10-CM | POA: Insufficient documentation

## 2017-01-21 LAB — CULTURE, BLOOD (ROUTINE X 2)
CULTURE: NO GROWTH
CULTURE: NO GROWTH

## 2017-01-21 LAB — GLUCOSE, POCT (MANUAL RESULT ENTRY): POC GLUCOSE: 212 mg/dL — AB (ref 70–99)

## 2017-01-21 MED ORDER — ATENOLOL 50 MG PO TABS: 50.0000 mg | ORAL_TABLET | Freq: Every day | ORAL | 2 refills | Status: DC

## 2017-01-21 MED FILL — POTASSIUM CL ER 20 MEQ TAB: 20 | 15 days supply | Qty: 15 | Fill #0

## 2017-01-21 MED FILL — ATENOLOL 50 MG TABLET: 50 | 30 days supply | Qty: 30 | Fill #0

## 2017-01-21 MED FILL — TRUEPLUS SYR 0.3ML 31GX5/16: 31G X 5/16" | 50 days supply | Qty: 100 | Fill #0

## 2017-01-21 NOTE — Progress Notes (Signed)
Patient is here for DKA FU  Patient denies pain at this time.  Patient has not taken medication today. Patient has eaten today.

## 2017-01-21 NOTE — Progress Notes (Signed)
Kayla Erickson  FTD:[id number]  CWC:[id number]  DOB - [birth year]  Chief Complaint  Patient presents with  . Hospitalization Follow-up    DKA       Subjective:   Kayla Erickson is a 36 y.o. female here today for establishment of care. She was hospitalized from 01/16/17-01/19/17 for DKA. He initially presented to the emergency department with complaints of dyspnea and fevers. She was found to be in DKA. Routine labs showed a creatinine of 2.0. Glucose of 426. White blood cell count of 13,000. Hemoglobin of 16. Lactic acid of 2.3. Cardiac enzymes negative. Chest x-ray negative. Hemoglobin A1c greater than 15%. She was initially managed by the critical care team and later transitioned to the internal medicine service. She was treated with IV fluids and IV insulin. Her hospital course, located by acute kidney injury, hypokalemia and hypomagnesemia. She also had a viral bronchitis. At the time of discharge her creatinine was 0.7. Her blood sugars of 134.  She was discharged home on 70/30 20 units twice daily with metformin 1000 mg twice daily. She also was to resume atenolol 50 mg daily. Lisinopril was discontinued secondary to acute kidney injury. Since discharge she has been more compliant with her regimen. However, due to her work hours it is kind of sporadic the timing that she checks her sugar and takes her insulin. In fact, last night around 4 AM she awakened with shakes and her blood sugar was 71. She treated herself with 2 glucose tablets and then when she awakened for the morning her blood sugar was 200. The last time that she saw a doctor was 6 months prior to her emergency department visit. She does not carry medical insurance.   ROS: GEN: denies fever or chills, denies change in weight Skin: denies lesions or rashes HEENT: denies headache, earache, epistaxis, sore throat, or neck pain LUNGS: denies SHOB, dyspnea, PND, orthopnea +cough, no phlegm CV: denies CP or palpitations ABD: denies  abd pain, N or V EXT: denies muscle spasms or swelling; no pain in lower ext, no weakness NEURO: denies numbness or tingling, denies sz, stroke or TIA   ALLERGIES: No Known Allergies  PAST MEDICAL HISTORY: Past Medical History:  Diagnosis Date  . Allergic rhinitis   . Diabetes mellitus type 2 with complications ([place])   . Hypertension   . 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  MEDICATIONS AT HOME: Prior to Admission medications   Medication Sig Start Date End Date Taking? Authorizing Provider  atenolol (TENORMIN) 50 MG tablet Take 1 tablet (50 mg total) by mouth daily. 02/04/17  Yes Keyon Liller Daneil Dan, PA-C  insulin aspart protamine- aspart (NOVOLOG MIX 70/30) (70-30) 100 UNIT/ML injection Inject 0.2 mLs (20 Units total) into the skin 2 (two) times daily with a meal. 01/19/17  Yes Dron Tanna Furry, MD  Insulin Syringes, Disposable, U-100 0.3 ML MISC 1 Units by Does not apply route 2 (two) times daily. 01/19/17  Yes Dron Tanna Furry, MD  metFORMIN (GLUCOPHAGE) 1000 MG tablet TAKE ONE TABLET by mouth TWICE DAILY WITH MEALS  09/17/14  Yes Jerrye Noble, MD  potassium chloride SA (K-DUR,KLOR-CON) 20 MEQ tablet Take 1 tablet (20 mEq total) by mouth daily. 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 Speech Clear. Not toxic looking HEENT: Atraumatic and Normocephalic, pupils equally reactive to light and accomodation Neck: supple, no JVD. No cervical lymphadenopathy.  Chest:Good air entry bilaterally, no added sounds  CVS: S1 S2 regular,  no murmurs.  Abdomen: Bowel sounds present, Non tender and not distended with no guarding, rigidity or rebound. Extremities: B/L Lower Ext shows no edema, both legs are warm to touch Neurology: Awake alert, and oriented X 3, CN II-XII intact, Non focal Skin:No Rash Wounds:N/A  Data Review Lab Results  Component Value Date   HGBA1C >15.5 (H) 01/18/2017   HGBA1C 9.1 12/06/2012   HGBA1C 7.4 12/28/2011     Assessment & Plan  1. DM2 with recent DKA and retinopathy  -education provided/compliance encouraged  -Cont 70/30 20 U BID for now with Met 100 mg BID  -Bring log with BS and times to next visit 2. AKI-resolved  -re check CR in 1 week   -Decide on restarting ACE or not  -water encouraged 3. HTN  -Cont BB for now  -reassess need for ACE after labs next week 4. Noncompliance   Financial counselor appt Return in about 1 week (around 01/28/2017).  The patient was given clear instructions to go to ER or return to medical center if symptoms don't improve, worsen or new problems develop. The patient verbalized understanding. The patient was told to call to get lab results if they haven't heard anything in the next week.   Total time spent with patient was 22 min. Greater than 50 % of this visit was spent face to face counseling and coordinating care regarding risk factor modification, compliance importance and encouragement, education related to DM and its complications.  This note has been created with Surveyor, quantity. Any transcriptional errors are unintentional.    Zettie Pho, PA-C [facility] and [facility], [place]   01/21/2017, 10:02 AM

## 2017-01-26 ENCOUNTER — Ambulatory Visit (HOSPITAL_COMMUNITY)
Admission: EM | Admit: 2017-01-26 | Discharge: 2017-01-26 | Disposition: A | Discharge status: Home / Self Care | Payer: Self-pay | Attending: Internal Medicine | Admitting: Internal Medicine

## 2017-01-26 ENCOUNTER — Encounter (HOSPITAL_COMMUNITY): Payer: Self-pay | Admitting: *Deleted

## 2017-01-26 DIAGNOSIS — R600 Localized edema: Secondary | ICD-10-CM

## 2017-01-26 NOTE — ED Provider Notes (Signed)
CSN: [id number]     Arrival date & time 01/26/17  1119 History   First MD Initiated Contact with Patient 01/26/17 1244     Chief Complaint  Patient presents with  . Leg Swelling   (Consider location/radiation/quality/duration/timing/severity/associated sxs/prior Treatment) The history is provided by the patient.  : 36 y/o female with H/O DM presented with B/L lower leg swelling x 3 days. Pt reports work requires prolong standing.Denies SOB, palpitations, Chest pain, leg pain. Edema non pitting, generalized to B/L leg and feet.  Past Medical History:  Diagnosis Date  . Allergic rhinitis   . Diabetes mellitus type 2 with complications (HCC)   . Hypertension   . 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 No pertinent surgical history. History reviewed. No pertinent family history. Social History  Substance Use Topics  . Smoking status: Never Smoker  . Smokeless tobacco: Never Used  . Alcohol use Yes   OB History    No data available     Review of Systems  Constitutional: Negative.   HENT: Negative.   Respiratory: Negative.   Cardiovascular: Positive for leg swelling (B/l leg swelling).  Endocrine: Negative for polydipsia, polyphagia and polyuria.  Genitourinary: Negative.   Neurological: Negative.   Psychiatric/Behavioral: Negative.     Allergies  Patient has no known allergies.  Home Medications   Prior to Admission medications   Medication Sig Start Date End Date Taking? Authorizing Provider  atenolol (TENORMIN) 50 MG tablet Take 1 tablet (50 mg total) by mouth daily. 02/04/17   Vivianne Master, PA-C  insulin aspart protamine- aspart (NOVOLOG MIX 70/30) (70-30) 100 UNIT/ML injection Inject 0.2 mLs (20 Units total) into the skin 2 (two) times daily with a meal. 01/19/17   Dron Jaynie Collins, MD  Insulin Syringes, Disposable, U-100 0.3 ML MISC 1 Units by Does not apply route 2 (two) times daily. 01/19/17   Dron  Jaynie Collins, MD  metFORMIN (GLUCOPHAGE) 1000 MG tablet TAKE ONE TABLET by mouth TWICE DAILY WITH MEALS  09/17/14   Carolan Clines, MD  potassium chloride SA (K-DUR,KLOR-CON) 20 MEQ tablet Take 1 tablet (20 mEq total) by mouth daily. 01/19/17   Dron Jaynie Collins, MD   Meds Ordered and Administered this Visit  Medications - No data to display  BP 124/82 (BP Location: Right Arm)   Pulse 82   Temp 98.6 F (37 C) (Oral)   Resp 18   LMP 12/26/2016   SpO2 100%  No data found.   Physical Exam  Constitutional: She is oriented to person, place, and time. She appears well-developed and well-nourished. No distress.  HENT:  Head: Normocephalic.  Eyes: Pupils are equal, round, and reactive to light.  Cardiovascular: Normal rate, regular rhythm and normal heart sounds.   B/L lower leg swelling, non pitting. No redness, calf pain appreciated. Pt states work requires prolong standing. Swelling improved when laying in bed.  Swelling highly likely a dependant edema from prolong standing.  Pulmonary/Chest: Effort normal and breath sounds normal. No respiratory distress. She has no wheezes. She has no rales. She exhibits no tenderness.  Abdominal: Soft. Bowel sounds are normal. She exhibits no distension and no mass. There is no tenderness. There is no rebound and no guarding.  Neurological: She is alert and oriented to person, place, and time.  Skin: Skin is warm.  Psychiatric: She has a normal mood and affect.    Urgent Care Course     Procedures (  including critical care time)  Labs Review Labs Reviewed - No data to display  Imaging Review No results found.   Visual Acuity Review  Right Eye Distance:   Left Eye Distance:   Bilateral Distance:    Right Eye Near:   Left Eye Near:    Bilateral Near:         MDM   1. Leg edema   B/L lower leg swelling, non pitting. No redness, calf pain appreciated. Pt states work requires prolong standing. Swelling improved when laying in  bed.  Swelling highly likely a dependant edema from prolong standing. Educated to keep legs elevated and use pillows at night. FU with PCP scheduled on Tuesday, encouraged to discuss with PCP if further investigation is warranted.   Ailish Prospero, NP 01/26/17 1303

## 2017-01-26 NOTE — Discharge Instructions (Signed)
Keep legs elevated. Use pillows when laying in bed. May use compression socks. Keep the appointment with PCP as scheduled

## 2017-01-26 NOTE — ED Triage Notes (Signed)
Both  Legs   Swelling   Was  In  Hospital         1   Week   Ago      For  Diabetes           Pt  Reports  Slight pain in her  Legs

## 2017-02-01 ENCOUNTER — Ambulatory Visit: Payer: Self-pay | Admitting: Family Medicine

## 2017-02-02 ENCOUNTER — Ambulatory Visit: Payer: Self-pay | Attending: Family Medicine | Admitting: Family Medicine

## 2017-02-02 ENCOUNTER — Other Ambulatory Visit: Payer: Self-pay

## 2017-02-02 ENCOUNTER — Telehealth: Payer: Self-pay

## 2017-02-02 VITALS — BP 115/75 | HR 81 | Temp 98.4°F | Resp 18 | Ht 63.0 in | Wt 173.2 lb

## 2017-02-02 DIAGNOSIS — E118 Type 2 diabetes mellitus with unspecified complications: Secondary | ICD-10-CM | POA: Insufficient documentation

## 2017-02-02 DIAGNOSIS — Z79899 Other long term (current) drug therapy: Secondary | ICD-10-CM | POA: Insufficient documentation

## 2017-02-02 DIAGNOSIS — R600 Localized edema: Secondary | ICD-10-CM | POA: Insufficient documentation

## 2017-02-02 DIAGNOSIS — Z794 Long term (current) use of insulin: Secondary | ICD-10-CM | POA: Insufficient documentation

## 2017-02-02 DIAGNOSIS — I10 Essential (primary) hypertension: Secondary | ICD-10-CM | POA: Insufficient documentation

## 2017-02-02 DIAGNOSIS — Z87898 Personal history of other specified conditions: Secondary | ICD-10-CM

## 2017-02-02 DIAGNOSIS — H538 Other visual disturbances: Secondary | ICD-10-CM | POA: Insufficient documentation

## 2017-02-02 LAB — GLUCOSE, POCT (MANUAL RESULT ENTRY): POC GLUCOSE: 147 mg/dL — AB (ref 70–99)

## 2017-02-02 MED ORDER — INSULIN DETEMIR 100 UNIT/ML ~~LOC~~ SOLN: 16.0000 [IU] | Freq: Every day | SUBCUTANEOUS | 11 refills | Status: DC

## 2017-02-02 MED FILL — !LEVEMIR 100 UNITS/ML VIAL: 100/ML | 42 days supply | Qty: 10 | Fill #0

## 2017-02-02 NOTE — Patient Instructions (Addendum)
Take CBG three times daily am, afternoon, and evening. Bring log of CBG's to next office appointment. You will be called with your labs results.     Hypoglycemia  Hypoglycemia is when the sugar (glucose) level in the blood is too low. Symptoms of low blood sugar may include:  Feeling:  Hungry.  Worried or nervous (anxious).  Sweaty and clammy.  Confused.  Dizzy.  Sleepy.  Sick to your stomach (nauseous).  Having:  A fast heartbeat.  A headache.  A change in your vision.  Jerky movements that you cannot control (seizure).  Nightmares.  Tingling or no feeling (numbness) around the mouth, lips, or tongue.  Having trouble with:  Talking.  Paying attention (concentrating).  Moving (coordination).  Sleeping.  Shaking.  Passing out (fainting).  Getting upset easily (irritability). Low blood sugar can happen to people who have diabetes and people who do not have diabetes. Low blood sugar can happen quickly, and it can be an emergency. Treating Low Blood Sugar  Low blood sugar is often treated by eating or drinking something sugary right away. If you can think clearly and swallow safely, follow the 15:15 rule:  Take 15 grams of a fast-acting carb (carbohydrate). Some fast-acting carbs are:  1 tube of glucose gel.  3 sugar tablets (glucose pills).  6-8 pieces of hard candy.  4 oz (120 mL) of fruit juice.  4 oz (120 mL) of regular (not diet) soda.  Check your blood sugar 15 minutes after you take the carb.  If your blood sugar is still at or below 70 mg/dL (3.9 mmol/L), take 15 grams of a carb again.  If your blood sugar does not go above 70 mg/dL (3.9 mmol/L) after 3 tries, get help right away.  After your blood sugar goes back to normal, eat a meal or a snack within 1 hour. Treating Very Low Blood Sugar  If your blood sugar is at or below 54 mg/dL (3 mmol/L), you have very low blood sugar (severe hypoglycemia). This is an emergency. Do not wait to  see if the symptoms will go away. Get medical help right away. Call your local emergency services (911 in the [place] Do not drive yourself to the hospital. If you have very low blood sugar and you cannot eat or drink, you may need a glucagon shot (injection). A family member or friend should learn how to check your blood sugar and how to give you a glucagon shot. Ask your doctor if you need to have a glucagon shot kit at home. Follow these instructions at home: General instructions   Avoid any diets that cause you to not eat enough food. Talk with your doctor before you start any new diet.  Take over-the-counter and prescription medicines only as told by your doctor.  Limit alcohol to no more than 1 drink per day for nonpregnant women and 2 drinks per day for men. One drink equals 12 oz of beer, 5 oz of wine, or 1 oz of hard liquor.  Keep all follow-up visits as told by your doctor. This is important. If You Have Diabetes:    Make sure you know the symptoms of low blood sugar.  Always keep a source of sugar with you, such as:  Sugar.  Sugar tablets.  Glucose gel.  Fruit juice.  Regular soda (not diet soda).  Milk.  Hard candy.  Honey.  Take your medicines as told.  Follow your exercise and meal plan.  Eat on time. Do  not skip meals.  Follow your sick day plan when you cannot eat or drink normally. Make this plan ahead of time with your doctor.  Check your blood sugar as often as told by your doctor. Always check before and after exercise.  Share your diabetes care plan with:  Your work or school.  People you live with.  Check your pee (urine) for ketones:  When you are sick.  As told by your doctor.  Carry a card or wear jewelry that says you have diabetes. If You Have Low Blood Sugar From Other Causes:    Check your blood sugar as often as told by your doctor.  Follow instructions from your doctor about what you cannot eat or drink. Contact a doctor  if:  You have trouble keeping your blood sugar in your target range.  You have low blood sugar often. Get help right away if:  You still have symptoms after you eat or drink something sugary.  Your blood sugar is at or below 54 mg/dL (3 mmol/L).  You have jerky movements that you cannot control.  You pass out. These symptoms may be an emergency. Do not wait to see if the symptoms will go away. Get medical help right away. Call your local emergency services (911 in the [place] Do not drive yourself to the hospital. This information is not intended to replace advice given to you by your health care provider. Make sure you discuss any questions you have with your health care provider. Document Released: 12/30/2009 Document Revised: 03/12/2016 Document Reviewed: 11/08/2015 Elsevier Interactive Patient Education  2017 [employer]  Insulin Detemir injection What is this medicine? INSULIN DETEMIR (IN su lin DE te mir) is a human-made form of insulin. This drug lowers the amount of sugar in your blood. It is a long-acting insulin that is usually given once or twice a day. This medicine may be used for other purposes; ask your health care provider or pharmacist if you have questions. COMMON BRAND NAME(S): Levemir, Levemir FlexTouch What should I tell my health care provider before I take this medicine? They need to know if you have any of these conditions: -episodes of low blood sugar -kidney disease -liver disease -an unusual or allergic reaction to insulin, metacresol, other medicines, foods, dyes, or preservatives -pregnant or trying to get pregnant -breast-feeding How should I use this medicine? This medicine is for injection under the skin. Take this medicine at the same time(s) each day. Use exactly as directed. This insulin should never be mixed in the same syringe with other insulins before injection. Do not vigorously shake insulin before use. You will be taught how to adjust doses  for activities and illness. Do not use more insulin than prescribed. Do not use more or less often than prescribed. Always check the appearance of your insulin before using it. This medicine should be clear and colorless like water. Do not use if it is cloudy, thickened, colored, or has solid particles in it. It is important that you put your used needles and syringes in a special sharps container. Do not put them in a trash can. If you do not have a sharps container, call your pharmacist or healthcare provider to get one. Talk to your pediatrician regarding the use of this medicine in children. While this drug may be prescribed for children as young as 2 years for selected conditions, precautions do apply. Overdosage: If you think you have taken too much of this medicine contact a poison  control center or emergency room at once. NOTE: This medicine is only for you. Do not share this medicine with others. What if I miss a dose? It is important not to miss a dose. Your health care professional or doctor should discuss a plan for missed doses with you. If you do miss a dose, follow their plan. Do not take double doses. What may interact with this medicine? -other medicines for diabetes Many medications may cause changes in blood sugar, these include: -alcohol containing beverages -antiviral medicines for HIV or AIDS -aspirin and aspirin-like drugs -certain medicines for blood pressure, heart disease, irregular heart beat -chromium -diuretics -female hormones, such as estrogens or progestins, birth control pills -fenofibrate -gemfibrozil -isoniazid -lanreotide -female hormones or anabolic steroids -MAOIs like Carbex, Eldepryl, Marplan, Nardil, and Parnate -medicines for weight loss -medicines for allergies, asthma, cold, or cough -medicines for depression, anxiety, or psychotic disturbances -niacin -nicotine -NSAIDs, medicines for pain and inflammation, like ibuprofen or  naproxen -octreotide -pasireotide -pentamidine -phenytoin -probenecid -quinolone antibiotics such as ciprofloxacin, levofloxacin, ofloxacin -some herbal dietary supplements -steroid medicines such as prednisone or cortisone -sulfamethoxazole; trimethoprim -thyroid hormones Some medications can hide the warning symptoms of low blood sugar (hypoglycemia). You may need to monitor your blood sugar more closely if you are taking one of these medications. These include: -beta-blockers, often used for high blood pressure or heart problems (examples include atenolol, metoprolol, propranolol) -clonidine -guanethidine -reserpine This list may not describe all possible interactions. Give your health care provider a list of all the medicines, herbs, non-prescription drugs, or dietary supplements you use. Also tell them if you smoke, drink alcohol, or use illegal drugs. Some items may interact with your medicine. What should I watch for while using this medicine? Visit your health care professional or doctor for regular checks on your progress. A test called the HbA1C (A1C) will be monitored. This is a simple blood test. It measures your blood sugar control over the last 2 to 3 months. You will receive this test every 3 to 6 months. Learn how to check your blood sugar. Learn the symptoms of low and high blood sugar and how to manage them. Always carry a quick-source of sugar with you in case you have symptoms of low blood sugar. Examples include hard sugar candy or glucose tablets. Make sure others know that you can choke if you eat or drink when you develop serious symptoms of low blood sugar, such as seizures or unconsciousness. They must get medical help at once. Tell your doctor or health care professional if you have high blood sugar. You might need to change the dose of your medicine. If you are sick or exercising more than usual, you might need to change the dose of your medicine. Do not skip meals.  Ask your doctor or health care professional if you should avoid alcohol. Many nonprescription cough and cold products contain sugar or alcohol. These can affect blood sugar. Make sure that you have the right kind of syringe for the type of insulin you use. Try not to change the brand and type of insulin or syringe unless your health care professional or doctor tells you to. Switching insulin brand or type can cause dangerously high or low blood sugar. Always keep an extra supply of insulin, syringes, and needles on hand. Use a syringe one time only. Throw away syringe and needle in a closed container to prevent accidental needle sticks. Insulin pens and cartridges should never be shared. Even if the needle  is changed, sharing may result in passing of viruses like hepatitis or HIV. Wear a medical ID bracelet or chain, and carry a card that describes your disease and details of your medicine and dosage times. What side effects may I notice from receiving this medicine? Side effects that you should report to your doctor or health care professional as soon as possible: -allergic reactions like skin rash, itching or hives, swelling of the face, lips, or tongue -breathing problems -signs and symptoms of high blood sugar such as dizziness, dry mouth, dry skin, fruity breath, nausea, stomach pain, increased hunger or thirst, increased urination -signs and symptoms of low blood sugar such as feeling anxious, confusion, dizziness, increased hunger, unusually weak or tired, sweating, shakiness, cold, irritable, headache, blurred vision, fast heartbeat, loss of consciousness Side effects that usually do not require medical attention (report to your doctor or health care professional if they continue or are bothersome): -increase or decrease in fatty tissue under the skin due to overuse of a particular injection site -itching, burning, swelling, or rash at site where injected This list may not describe all possible  side effects. Call your doctor for medical advice about side effects. You may report side effects to FDA at 1-800-FDA-1088. Where should I keep my medicine? Keep out of the reach of children. Store [employer] in a refrigerator between 2 and 8 degrees C (36 and 46 degrees F.) Do not freeze or use if the insulin has been frozen. Once opened, the pens should be kept at room temperature, below 30 degrees C (86 degrees F). Do not store in the refrigerator once opened. Once opened, the insulin can be used for 42 days. After 42 days, the Flextouch Pen should be thrown away. Store unopened insulin vials in a refrigerator between 2 and 8 degrees C (36 and 46 degrees F). Do not freeze or use if the insulin has been frozen. Opened vials (vials currently in use) should be stored in a refrigerator, never a freezer. If refrigeration is not possible, the opened vial can be stored unrefrigerated at room temperature, below 30 degrees C (86 degrees F) for up to 42 days. After 42 days, the vial of insulin should be thrown away. Keeping your insulin at room temperature decreases the amount of pain during injection. Protect from light and excessive heat. Throw away any unused medicine after the expiration date or after the specified time for room temperature storage has passed. NOTE: This sheet is a summary. It may not cover all possible information. If you have questions about this medicine, talk to your doctor, pharmacist, or health care provider.  2018 Elsevier/Gold Standard (2016-04-28 14:20:00)

## 2017-02-02 NOTE — Telephone Encounter (Signed)
CMA call Patient to let her know her echo appt on Feb 16, 2017 @ [facility]  Patient was aware and understood

## 2017-02-02 NOTE — Progress Notes (Signed)
Patient is here for HTn & DM  Patient stated that both foot are swollen & has pain  Patient has not taking her current medication for today  Patient has not eaten for today

## 2017-02-02 NOTE — Progress Notes (Signed)
Subjective:  Patient ID: Kayla Erickson, female    DOB: 03-01-1981  Age: 36 y.o. MRN: [id number]  CC: No chief complaint on file.   HPI Kayla Erickson presents for  [id number] of hypertension. Reports adherence with antihypertensive medications. Denies any chest pain or shortness of breath. She reports history of recurrent bilateral lower leg swelling for one week. Recent history ED for same problem. Denies any history of injury, redness, tenderness, or prolonged travel. 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 She denies any heavy alcohol use or liver disease. She reports wearing compression in keeping feet elevated to help with symptoms with minimal relief.   DM: History of diabetes. Reports being diagnosed 7 years ago. Reports feeling being started 3 weeks ago. She takes blood sugars 3 times a day. She brings glucometer with her to office visit. Blood sugars appear) sporadic  with CBGs ranging from 50's-343. Lowest CBG on glucometer was 39. She reports not consistently eating meals.   Outpatient Medications Prior to Visit  Medication Sig Dispense Refill  . atenolol (TENORMIN) 50 MG tablet Take 1 tablet (50 mg total) by mouth daily. 30 tablet 2  . metFORMIN (GLUCOPHAGE) 1000 MG tablet TAKE ONE TABLET by mouth TWICE DAILY WITH MEALS  180 tablet 0  . potassium chloride SA (K-DUR,KLOR-CON) 20 MEQ tablet Take 1 tablet (20 mEq total) by mouth daily. 15 tablet 0  . insulin aspart protamine- aspart (NOVOLOG MIX 70/30) (70-30) 100 UNIT/ML injection Inject 0.2 mLs (20 Units total) into the skin 2 (two) times daily with a meal. 10 mL 0  . Insulin Syringes, Disposable, U-100 0.3 ML MISC 1 Units by Does not apply route 2 (two) times daily. 40 each 0   No facility-administered medications prior to visit.     ROS Review of Systems  Constitutional: Negative.   Eyes: Negative.   Respiratory: Negative.   Cardiovascular: Negative.   Gastrointestinal: Negative.   Musculoskeletal:  Negative.   Skin:       Swelling of the  feet.     Objective:  BP 115/75 (BP Location: Left Arm, Patient Position: Sitting, Cuff Size: Normal)   Pulse 81   Temp 98.4 F (36.9 C) (Oral)   Resp 18   Ht 5' 3" (1.6 m)   Wt 173 lb 3.2 oz (78.6 kg)   SpO2 98%   BMI 30.68 kg/m   BP/Weight 02/02/2017 3/81/8403 04/23/4359  Systolic BP 677 034 035  Diastolic BP 75 82 86  Wt. (Lbs) 173.2 - 153.8  BMI 30.68 - 27.24   Physical Exam  Constitutional: She appears well-developed and well-nourished.  Eyes: Conjunctivae are normal. Pupils are equal, round, and reactive to light.  Cardiovascular: Normal rate, regular rhythm, normal heart sounds and intact distal pulses.   Pulmonary/Chest: Effort normal and breath sounds normal.  Abdominal: Soft. Bowel sounds are normal.  Musculoskeletal: Normal range of motion.  Skin: Skin is warm and dry.  2+ Edema to BLE.   Psychiatric: She has a normal mood and affect.  Nursing note and vitals reviewed.  Assessment & Plan:   Problem List Items Addressed This Visit      Cardiovascular and Mediastinum   Essential hypertension - Primary   Relevant Orders   CMP14+EGFR (Completed)   Microalbumin/Creatinine Ratio, Urine (Completed)   DG Chest 2 View   ECHOCARDIOGRAM COMPLETE     Endocrine   Diabetes mellitus type 2 with complications ([place])   .Novolog 70/30 discontinued to due frequent episodes of hypoglycemia and  infrequent meal eating.    Start taking CBG's TID and bring glucometer or log to next office appointment.    Follow up with clinical pharmacist in 2 weeks.    Follow up with PCP in 3 months.   Relevant Medications   insulin detemir (LEVEMIR) 100 UNIT/ML injection   Other Relevant Orders   CMP14+EGFR (Completed)   CBC with Differential (Completed)   POCT glucose (manual entry) (Completed)   Microalbumin/Creatinine Ratio, Urine (Completed)   Brain natriuretic peptide (Completed)    Other Visit Diagnoses    Blurred vision, bilateral        Relevant Orders   Ambulatory referral to Ophthalmology   Bilateral lower extremity edema       Relevant Orders   CMP14+EGFR (Completed)   EKG 12-Lead   Brain natriuretic peptide (Completed)   ECHOCARDIOGRAM COMPLETE   Compression stockings   History of tachycardia       Relevant Orders   ECHOCARDIOGRAM COMPLETE      Meds ordered this encounter  Medications  . insulin detemir (LEVEMIR) 100 UNIT/ML injection    Sig: Inject 0.16 mLs (16 Units total) into the skin at bedtime.    Dispense:  10 mL    Refill:  11    Order Specific Question:   Supervising Provider    Answer:   Tresa Garter [[id number]]    Follow-up: Return in about 2 weeks (around 02/16/2017) for Diabetes with clinical pharmacist.    Alfonse Spruce FNP

## 2017-02-03 LAB — CMP14+EGFR
A/G RATIO: 2 (ref 1.2–2.2)
ALBUMIN: 4 g/dL (ref 3.5–5.5)
ALK PHOS: 157 IU/L — AB (ref 39–117)
ALT: 102 IU/L — ABNORMAL HIGH (ref 0–32)
AST: 102 IU/L — ABNORMAL HIGH (ref 0–40)
BILIRUBIN TOTAL: 0.4 mg/dL (ref 0.0–1.2)
BUN / CREAT RATIO: 20 (ref 9–23)
BUN: 23 mg/dL — ABNORMAL HIGH (ref 6–20)
CHLORIDE: 102 mmol/L (ref 96–106)
CO2: 25 mmol/L (ref 18–29)
Calcium: 9.6 mg/dL (ref 8.7–10.2)
Creatinine, Ser: 1.13 mg/dL — ABNORMAL HIGH (ref 0.57–1.00)
GFR calc non Af Amer: 64 mL/min/{1.73_m2} (ref >59)
GFR, EST AFRICAN AMERICAN: 73 mL/min/{1.73_m2} (ref >59)
Globulin, Total: 2 g/dL (ref 1.5–4.5)
Glucose: 148 mg/dL — ABNORMAL HIGH (ref 65–99)
POTASSIUM: 5 mmol/L (ref 3.5–5.2)
Sodium: 141 mmol/L (ref 134–144)
TOTAL PROTEIN: 6 g/dL (ref 6.0–8.5)

## 2017-02-03 LAB — CBC WITH DIFFERENTIAL/PLATELET
BASOS ABS: 0 10*3/uL (ref 0.0–0.2)
Basos: 1 %
EOS (ABSOLUTE): 0.1 10*3/uL (ref 0.0–0.4)
Eos: 2 %
Hematocrit: 35.2 % (ref 34.0–46.6)
Hemoglobin: 11.8 g/dL (ref 11.1–15.9)
Immature Grans (Abs): 0 10*3/uL (ref 0.0–0.1)
Immature Granulocytes: 0 %
LYMPHS ABS: 1.3 10*3/uL (ref 0.7–3.1)
Lymphs: 20 %
MCH: 32.5 pg (ref 26.6–33.0)
MCHC: 33.5 g/dL (ref 31.5–35.7)
MCV: 97 fL (ref 79–97)
MONOS ABS: 0.4 10*3/uL (ref 0.1–0.9)
Monocytes: 7 %
NEUTROS ABS: 4.7 10*3/uL (ref 1.4–7.0)
Neutrophils: 70 %
Platelets: 297 10*3/uL (ref 150–379)
RBC: 3.63 x10E6/uL — ABNORMAL LOW (ref 3.77–5.28)
RDW: 13.7 % (ref 12.3–15.4)
WBC: 6.6 10*3/uL (ref 3.4–10.8)

## 2017-02-03 LAB — MICROALBUMIN / CREATININE URINE RATIO
Creatinine, Urine: 18.3 mg/dL
MICROALBUM., U, RANDOM: 16.3 ug/mL
Microalb/Creat Ratio: 89.1 mg/g creat — ABNORMAL HIGH (ref 0.0–30.0)

## 2017-02-03 LAB — BRAIN NATRIURETIC PEPTIDE: BNP: 59.2 pg/mL (ref 0.0–100.0)

## 2017-02-09 ENCOUNTER — Ambulatory Visit: Payer: Self-pay | Attending: Family Medicine

## 2017-02-09 ENCOUNTER — Other Ambulatory Visit: Payer: Self-pay | Admitting: Family Medicine

## 2017-02-09 ENCOUNTER — Telehealth: Payer: Self-pay

## 2017-02-09 DIAGNOSIS — R609 Edema, unspecified: Secondary | ICD-10-CM

## 2017-02-09 DIAGNOSIS — R809 Proteinuria, unspecified: Secondary | ICD-10-CM

## 2017-02-09 DIAGNOSIS — R945 Abnormal results of liver function studies: Secondary | ICD-10-CM

## 2017-02-09 DIAGNOSIS — R7989 Other specified abnormal findings of blood chemistry: Secondary | ICD-10-CM

## 2017-02-09 MED ORDER — LISINOPRIL 2.5 MG PO TABS: 2.5000 mg | ORAL_TABLET | Freq: Every day | ORAL | 2 refills | Status: DC

## 2017-02-09 NOTE — Telephone Encounter (Signed)
-----   Message from Lizbeth Bark, FNP sent at 02/09/2017 10:02 AM EDT ----- Liver function test were elevated. This cause be caused by a variety of different sources including hepatitis, chronic alcohol use, and fatty liver disease. Recommend labs to screen for hepatitis and abdominal ultrasound imaging to further evaluate. Lab that evaluated blood cells were normal. Kidney function normal Microalbumin/creatinine ratio level was elevated. This tests for protein in your urine that could indicate early signs of kidney damage. You will be prescribed lisinopril to help lower this risk. Continue to take BP medications. Recommend repeat in 3 month.  BNP lab was normal. BNP can be elevated with heart damage.

## 2017-02-09 NOTE — Telephone Encounter (Signed)
CMA call regarding lab results   Patient did not answer but left a VM stating the reason of the call &  to call me back  

## 2017-02-09 NOTE — Telephone Encounter (Signed)
CMA call regarding lab results & to get the best available date & time to scheduled the abdominal appt   Patient verify DOB  Patient was aware and understood  Call patient regarding US abdomen appt @ [facility] @ 8:30 am empty stomach after midnight before the appt  Patient did not answer but left a detailed  message

## 2017-02-15 ENCOUNTER — Other Ambulatory Visit: Payer: Self-pay | Admitting: *Deleted

## 2017-02-15 MED ORDER — INSULIN GLARGINE 100 UNIT/ML SOLOSTAR PEN: 16.0000 [IU] | PEN_INJECTOR | Freq: Every day | SUBCUTANEOUS | 3 refills | Status: DC

## 2017-02-15 NOTE — Telephone Encounter (Signed)
PRINTED FOR PASS PROGRAM 

## 2017-02-16 ENCOUNTER — Ambulatory Visit (HOSPITAL_BASED_OUTPATIENT_CLINIC_OR_DEPARTMENT_OTHER)
Admission: RE | Admit: 2017-02-16 | Discharge: 2017-02-16 | Disposition: A | Discharge status: Home / Self Care | Payer: Self-pay | Source: Ambulatory Visit | Attending: Family Medicine | Admitting: Family Medicine

## 2017-02-16 ENCOUNTER — Ambulatory Visit (HOSPITAL_COMMUNITY)
Admission: RE | Admit: 2017-02-16 | Discharge: 2017-02-16 | Disposition: A | Discharge status: Home / Self Care | Payer: Self-pay | Source: Ambulatory Visit | Attending: Family Medicine | Admitting: Family Medicine

## 2017-02-16 ENCOUNTER — Ambulatory Visit (HOSPITAL_COMMUNITY): Payer: Self-pay

## 2017-02-16 DIAGNOSIS — R7989 Other specified abnormal findings of blood chemistry: Secondary | ICD-10-CM | POA: Insufficient documentation

## 2017-02-16 DIAGNOSIS — R945 Abnormal results of liver function studies: Secondary | ICD-10-CM

## 2017-02-16 DIAGNOSIS — Z87898 Personal history of other specified conditions: Secondary | ICD-10-CM

## 2017-02-16 DIAGNOSIS — R600 Localized edema: Secondary | ICD-10-CM | POA: Insufficient documentation

## 2017-02-16 DIAGNOSIS — R609 Edema, unspecified: Secondary | ICD-10-CM

## 2017-02-16 DIAGNOSIS — I10 Essential (primary) hypertension: Secondary | ICD-10-CM

## 2017-02-18 ENCOUNTER — Telehealth: Payer: Self-pay | Admitting: Family Medicine

## 2017-02-18 ENCOUNTER — Ambulatory Visit: Payer: Self-pay | Attending: Family Medicine | Admitting: Pharmacist

## 2017-02-18 DIAGNOSIS — Z794 Long term (current) use of insulin: Secondary | ICD-10-CM | POA: Insufficient documentation

## 2017-02-18 DIAGNOSIS — E118 Type 2 diabetes mellitus with unspecified complications: Secondary | ICD-10-CM

## 2017-02-18 DIAGNOSIS — E1165 Type 2 diabetes mellitus with hyperglycemia: Secondary | ICD-10-CM | POA: Insufficient documentation

## 2017-02-18 MED ORDER — METFORMIN HCL 1000 MG PO TABS: 1000.0000 mg | ORAL_TABLET | Freq: Two times a day (BID) | ORAL | 2 refills | Status: DC

## 2017-02-18 MED ORDER — INSULIN DETEMIR 100 UNIT/ML ~~LOC~~ SOLN: 15.0000 [IU] | Freq: Two times a day (BID) | SUBCUTANEOUS | 11 refills | Status: DC

## 2017-02-18 MED ORDER — ATENOLOL 50 MG PO TABS: 50.0000 mg | ORAL_TABLET | Freq: Every day | ORAL | 2 refills | Status: DC

## 2017-02-18 MED FILL — ATENOLOL 50 MG TABLET: 50 | 30 days supply | Qty: 30 | Fill #0

## 2017-02-18 MED FILL — LISINOPRIL 2.5 MG TABLET: 2.5 | 30 days supply | Qty: 30 | Fill #0

## 2017-02-18 MED FILL — ?METFORMIN HCL 1,000 MG TAB: 1000 | 30 days supply | Qty: 60 | Fill #0

## 2017-02-18 NOTE — Telephone Encounter (Signed)
Pt. Came to facility requesting a refill on metformin. Pt. States it was not prescribed by her PCP but she is taking it. Please f/u.

## 2017-02-18 NOTE — Telephone Encounter (Signed)
Patient seeing me today for diabetes follow up, refilled metformin

## 2017-02-18 NOTE — Patient Instructions (Addendum)
Thanks for coming to see [place]!  I have refilled your metformin and atenolol.  Increase Levemir to 15 units twice daily. If you get any lows (<70) on this, please decrease to 10 units twice daily.  Try to not use the 70/30 for now so we can get the Levemir dose right.  Come back and see me in 2 weeks.   Hypoglycemia  Hypoglycemia is when the sugar (glucose) level in the blood is too low. Symptoms of low blood sugar may include:  Feeling:  Hungry.  Worried or nervous (anxious).  Sweaty and clammy.  Confused.  Dizzy.  Sleepy.  Sick to your stomach (nauseous).  Having:  A fast heartbeat.  A headache.  A change in your vision.  Jerky movements that you cannot control (seizure).  Nightmares.  Tingling or no feeling (numbness) around the mouth, lips, or tongue.  Having trouble with:  Talking.  Paying attention (concentrating).  Moving (coordination).  Sleeping.  Shaking.  Passing out (fainting).  Getting upset easily (irritability). Low blood sugar can happen to people who have diabetes and people who do not have diabetes. Low blood sugar can happen quickly, and it can be an emergency. Treating Low Blood Sugar  Low blood sugar is often treated by eating or drinking something sugary right away. If you can think clearly and swallow safely, follow the 15:15 rule:  Take 15 grams of a fast-acting carb (carbohydrate). Some fast-acting carbs are:  1 tube of glucose gel.  3 sugar tablets (glucose pills).  6-8 pieces of hard candy.  4 oz (120 mL) of fruit juice.  4 oz (120 mL) of regular (not diet) soda.  Check your blood sugar 15 minutes after you take the carb.  If your blood sugar is still at or below 70 mg/dL (3.9 mmol/L), take 15 grams of a carb again.  If your blood sugar does not go above 70 mg/dL (3.9 mmol/L) after 3 tries, get help right away.  After your blood sugar goes back to normal, eat a meal or a snack within 1 hour. Treating Very Low  Blood Sugar  If your blood sugar is at or below 54 mg/dL (3 mmol/L), you have very low blood sugar (severe hypoglycemia). This is an emergency. Do not wait to see if the symptoms will go away. Get medical help right away. Call your local emergency services (911 in the [place] Do not drive yourself to the hospital. If you have very low blood sugar and you cannot eat or drink, you may need a glucagon shot (injection). A family member or friend should learn how to check your blood sugar and how to give you a glucagon shot. Ask your doctor if you need to have a glucagon shot kit at home. Follow these instructions at home: General instructions   Avoid any diets that cause you to not eat enough food. Talk with your doctor before you start any new diet.  Take over-the-counter and prescription medicines only as told by your doctor.  Limit alcohol to no more than 1 drink per day for nonpregnant women and 2 drinks per day for men. One drink equals 12 oz of beer, 5 oz of wine, or 1 oz of hard liquor.  Keep all follow-up visits as told by your doctor. This is important. If You Have Diabetes:    Make sure you know the symptoms of low blood sugar.  Always keep a source of sugar with you, such as:  Sugar.  Sugar tablets.  Glucose gel.  Fruit juice.  Regular soda (not diet soda).  Milk.  Hard candy.  Honey.  Take your medicines as told.  Follow your exercise and meal plan.  Eat on time. Do not skip meals.  Follow your sick day plan when you cannot eat or drink normally. Make this plan ahead of time with your doctor.  Check your blood sugar as often as told by your doctor. Always check before and after exercise.  Share your diabetes care plan with:  Your work or school.  People you live with.  Check your pee (urine) for ketones:  When you are sick.  As told by your doctor.  Carry a card or wear jewelry that says you have diabetes. If You Have Low Blood Sugar From Other  Causes:    Check your blood sugar as often as told by your doctor.  Follow instructions from your doctor about what you cannot eat or drink. Contact a doctor if:  You have trouble keeping your blood sugar in your target range.  You have low blood sugar often. Get help right away if:  You still have symptoms after you eat or drink something sugary.  Your blood sugar is at or below 54 mg/dL (3 mmol/L).  You have jerky movements that you cannot control.  You pass out. These symptoms may be an emergency. Do not wait to see if the symptoms will go away. Get medical help right away. Call your local emergency services (911 in the [place] Do not drive yourself to the hospital. This information is not intended to replace advice given to you by your health care provider. Make sure you discuss any questions you have with your health care provider. Document Released: 12/30/2009 Document Revised: 03/12/2016 Document Reviewed: 11/08/2015 Elsevier Interactive Patient Education  2017 [employer].

## 2017-02-18 NOTE — Progress Notes (Signed)
    S:     Chief Complaint  Patient presents with  . Medication Management    Patient arrives in good spirits.  Presents for diabetes evaluation, education, and management [facility]ede. Patient was referred on 02/02/17.  Patient was last seen by Primary Care Provider on 02/02/17.   Patient reports Diabetes was diagnosed about 7 years ago.  Patient reports adherence with medications.  Current diabetes medications include: Lantus 16 units daily, and metformin 1000 mg BID. Of note she will use 70/30 when she feels like her blood glucose is too high and this will often result in hypoglycemia.  Patient reports hypoglycemic events.  Patient reported dietary habits: tries to watch what she eats to avoid gaining weight.  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  Patient denies neuropathy. Patient denies visual changes. Patient reports self foot exams.    O:  Physical Exam   ROS   Lab Results  Component Value Date   HGBA1C >15.5 (H) 01/18/2017   There were no vitals filed for this visit.  Home fasting CBG: 200s-300s 2 hour post-prandial/random CBG: 60s-200s (lower after taking 70/30)   A/P: Diabetes longstanding currently UNcontrolled based on A1c of >15.5. Patient reports hypoglycemic events and is able to verbalize appropriate hypoglycemia management plan. Patient reports adherence with medication. Control is suboptimal due to Levemir not lasting all day..  Continue metformin 1000 mg BID. Split Levemir and increase dose to 15 units twice a day. This should prevent her from having to use the 70/30 - instructed patient to not use 70/30. Patient to monitor for hypoglycemia and reduce Levemir to 10 units twice daily if she has any hypoglycemia. Will follow up with me in 2 weeks for insulin titration.  Next A1C anticipated July 2018.    Written patient instructions provided.  Total time in face  to face counseling 20 minutes.   Follow up in Pharmacist Clinic Visit in 2 weeks.   Patient seen with Susy FrizzleNicole Dunn, PharmD Candidate

## 2017-02-22 ENCOUNTER — Telehealth: Payer: Self-pay

## 2017-02-22 NOTE — Telephone Encounter (Signed)
CMA call patient regarding lab results  Patient did not answer but left a VM stating the reason of the call & to call me back  

## 2017-02-22 NOTE — Telephone Encounter (Signed)
-----   Message from Lizbeth BarkMandesia R Hairston, FNP sent at 02/22/2017  2:10 PM EDT ----- Liver was normal and showed no lesions.  Gallbladder showed no gallstones or thickening.  Right kidney normal.  Left kidney shows two small cysts 1.2 cm in diameter. No swelling of the kidney's seen. Per previous labs kidney function is normal. This is an incidental finding. Recommend follow up if experiencing symptoms.

## 2017-02-23 ENCOUNTER — Telehealth: Payer: Self-pay

## 2017-02-23 NOTE — Telephone Encounter (Signed)
Patient return CMA call   Patient Verify DOB   Patient was aware and understood  

## 2017-02-24 ENCOUNTER — Telehealth: Payer: Self-pay

## 2017-02-24 NOTE — Telephone Encounter (Signed)
-----   Message from Lizbeth BarkMandesia R Hairston, FNP sent at 02/24/2017 12:41 PM EDT ----- -Echocardiogram which looks at your heart structure and function was normal. No enlargement of the heart seen. No hardening of the heart vessels seen. No leakage of the heart valves present.

## 2017-02-24 NOTE — Telephone Encounter (Signed)
CMA call regarding lab results   Patient Verify DOB   Patient was aware and understood  

## 2017-03-04 ENCOUNTER — Ambulatory Visit: Payer: Self-pay | Attending: Family Medicine | Admitting: Pharmacist

## 2017-03-04 ENCOUNTER — Other Ambulatory Visit: Payer: Self-pay | Admitting: *Deleted

## 2017-03-04 DIAGNOSIS — E118 Type 2 diabetes mellitus with unspecified complications: Secondary | ICD-10-CM

## 2017-03-04 DIAGNOSIS — Z794 Long term (current) use of insulin: Secondary | ICD-10-CM | POA: Insufficient documentation

## 2017-03-04 DIAGNOSIS — E11649 Type 2 diabetes mellitus with hypoglycemia without coma: Secondary | ICD-10-CM | POA: Insufficient documentation

## 2017-03-04 MED ORDER — "INSULIN SYRINGE-NEEDLE U-100 31G X 5/16"" 0.3 ML MISC": 1.0000 | Freq: Three times a day (TID) | 12 refills | Status: DC

## 2017-03-04 MED ORDER — INSULIN DETEMIR 100 UNIT/ML ~~LOC~~ SOLN: SUBCUTANEOUS | 3 refills | Status: DC

## 2017-03-04 MED FILL — TRUEPLUS SYR 0.5ML 31GX5/16: 31G X 5/16" | 30 days supply | Qty: 100 | Fill #0

## 2017-03-04 MED FILL — LEVEMIR 100 UNITS/ML VIAL: 100 | 40 days supply | Qty: 10 | Fill #0

## 2017-03-04 NOTE — Telephone Encounter (Signed)
[facility]

## 2017-03-04 NOTE — Progress Notes (Signed)
    S:     No chief complaint on file.   Patient arrives in good spirits.  Presents for diabetes evaluation, education, and management [facility]. Jegede. Patient was referred on 02/02/17.  Patient was last seen by Primary Care Provider on 02/02/17.   Patient reports Diabetes was diagnosed about 7 years ago.  Patient reports adherence with medications.  Current diabetes medications include: Levemir 15 units BID, and metformin 1000 mg BID. She has not used the 70/30 insulin.  Patient reports hypoglycemic events. She woke up with a reading of 48 and another of 61. She ate yogurt to treat the episode.  Patient reported dietary habits: tries to watch what she eats to avoid gaining weight.  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  Patient denies neuropathy. Patient denies visual changes. Patient reports self foot exams.    O:  Physical Exam   ROS   Lab Results  Component Value Date   HGBA1C >15.5 (H) 01/18/2017   There were no vitals filed for this visit.  Home fasting CBG: 48-150s 2 hour post-prandial/random CBG: 150s (one 300 after cheese fries)   A/P: Diabetes longstanding currently UNcontrolled based on A1c of >15.5 and hypoglycemia. Patient reports hypoglycemic events and is able to verbalize appropriate hypoglycemia management plan (will drink juice or something fast acting rather than eat yogurt). Patient reports adherence with medication. Control is suboptimal due to hypoglycemia  Continue metformin 1000 mg BID. Continue Levemir 15 units in the morning and decrease evening dose to 10 units. Hopefully this will get rid of any hypoglycemia. Stressed to patient to call with any readings <70 so we can titrate her insulin if needed. Patient verbalized understanding.   Next A1C anticipated July 2018.    Written patient instructions provided.  Total time in face to face counseling 20 minutes.     Follow up in Pharmacist Clinic Visit in 2 weeks.   Patient seen with Susy FrizzleNicole Dunn, PharmD Candidate

## 2017-03-04 NOTE — Patient Instructions (Signed)
Thanks for coming to see Kayla Erickson  Decrease your night time dose of Levemir to 10 units. Continue 15 units in the morning.  Come back in 2 weeks.  Please call or come see Kayla Erickson with any readings <70.   Hypoglycemia  Hypoglycemia is when the sugar (glucose) level in the blood is too low. Symptoms of low blood sugar may include:  Feeling:  Hungry.  Worried or nervous (anxious).  Sweaty and clammy.  Confused.  Dizzy.  Sleepy.  Sick to your stomach (nauseous).  Having:  A fast heartbeat.  A headache.  A change in your vision.  Jerky movements that you cannot control (seizure).  Nightmares.  Tingling or no feeling (numbness) around the mouth, lips, or tongue.  Having trouble with:  Talking.  Paying attention (concentrating).  Moving (coordination).  Sleeping.  Shaking.  Passing out (fainting).  Getting upset easily (irritability). Low blood sugar can happen to people who have diabetes and people who do not have diabetes. Low blood sugar can happen quickly, and it can be an emergency. Treating Low Blood Sugar  Low blood sugar is often treated by eating or drinking something sugary right away. If you can think clearly and swallow safely, follow the 15:15 rule:  Take 15 grams of a fast-acting carb (carbohydrate). Some fast-acting carbs are:  1 tube of glucose gel.  3 sugar tablets (glucose pills).  6-8 pieces of hard candy.  4 oz (120 mL) of fruit juice.  4 oz (120 mL) of regular (not diet) soda.  Check your blood sugar 15 minutes after you take the carb.  If your blood sugar is still at or below 70 mg/dL (3.9 mmol/L), take 15 grams of a carb again.  If your blood sugar does not go above 70 mg/dL (3.9 mmol/L) after 3 tries, get help right away.  After your blood sugar goes back to normal, eat a meal or a snack within 1 hour. Treating Very Low Blood Sugar  If your blood sugar is at or below 54 mg/dL (3 mmol/L), you have very low blood sugar (severe  hypoglycemia). This is an emergency. Do not wait to see if the symptoms will go away. Get medical help right away. Call your local emergency services (911 in the [place] Do not drive yourself to the hospital. If you have very low blood sugar and you cannot eat or drink, you may need a glucagon shot (injection). A family member or friend should learn how to check your blood sugar and how to give you a glucagon shot. Ask your doctor if you need to have a glucagon shot kit at home. Follow these instructions at home: General instructions   Avoid any diets that cause you to not eat enough food. Talk with your doctor before you start any new diet.  Take over-the-counter and prescription medicines only as told by your doctor.  Limit alcohol to no more than 1 drink per day for nonpregnant women and 2 drinks per day for men. One drink equals 12 oz of beer, 5 oz of wine, or 1 oz of hard liquor.  Keep all follow-up visits as told by your doctor. This is important. If You Have Diabetes:    Make sure you know the symptoms of low blood sugar.  Always keep a source of sugar with you, such as:  Sugar.  Sugar tablets.  Glucose gel.  Fruit juice.  Regular soda (not diet soda).  Milk.  Hard candy.  Honey.  Take your medicines as  told.  Follow your exercise and meal plan.  Eat on time. Do not skip meals.  Follow your sick day plan when you cannot eat or drink normally. Make this plan ahead of time with your doctor.  Check your blood sugar as often as told by your doctor. Always check before and after exercise.  Share your diabetes care plan with:  Your work or school.  People you live with.  Check your pee (urine) for ketones:  When you are sick.  As told by your doctor.  Carry a card or wear jewelry that says you have diabetes. If You Have Low Blood Sugar From Other Causes:    Check your blood sugar as often as told by your doctor.  Follow instructions from your doctor  about what you cannot eat or drink. Contact a doctor if:  You have trouble keeping your blood sugar in your target range.  You have low blood sugar often. Get help right away if:  You still have symptoms after you eat or drink something sugary.  Your blood sugar is at or below 54 mg/dL (3 mmol/L).  You have jerky movements that you cannot control.  You pass out. These symptoms may be an emergency. Do not wait to see if the symptoms will go away. Get medical help right away. Call your local emergency services (911 in the [place] Do not drive yourself to the hospital. This information is not intended to replace advice given to you by your health care provider. Make sure you discuss any questions you have with your health care provider. Document Released: 12/30/2009 Document Revised: 03/12/2016 Document Reviewed: 11/08/2015 Elsevier Interactive Patient Education  2017 [employer].

## 2017-03-18 ENCOUNTER — Ambulatory Visit: Payer: Self-pay | Admitting: Pharmacist

## 2017-03-19 MED FILL — ATENOLOL 50 MG TABLET: 50 | 30 days supply | Qty: 30 | Fill #1

## 2017-03-19 MED FILL — metFORMIN HCL 1000 MG TABS: 1000 | 30 days supply | Qty: 60 | Fill #1

## 2017-03-19 MED FILL — LISINOPRIL 2.5 MG TABLET: 2.5 | 30 days supply | Qty: 30 | Fill #1

## 2017-04-12 ENCOUNTER — Other Ambulatory Visit: Payer: Self-pay | Admitting: Pharmacist

## 2017-04-12 MED ORDER — INSULIN PEN NEEDLE 32G X 4 MM MISC: 5 refills | Status: DC

## 2017-04-12 MED ORDER — INSULIN GLARGINE 100 UNIT/ML SOLOSTAR PEN: PEN_INJECTOR | SUBCUTANEOUS | 3 refills | Status: DC

## 2017-04-12 MED FILL — TRUEPLUS PEN NDL 32GX5/32: 32GX 5/32" | 30 days supply | Qty: 100 | Fill #0

## 2017-04-12 MED FILL — TRUEPLUS PEN NDL 32GX5/32": 32GX 5/32" | 30 days supply | Qty: 100 | Fill #0

## 2017-04-12 MED FILL — $LANTUS SOLOSTAR 100 UNITS/: 100 | 36 days supply | Qty: 9 | Fill #0

## 2017-04-15 ENCOUNTER — Ambulatory Visit: Payer: Self-pay | Admitting: Pharmacist

## 2017-04-15 NOTE — Progress Notes (Deleted)
    S:     No chief complaint on file.   Patient arrives in good spirits.  Presents for diabetes evaluation, education, and management [facility]ede. Patient was referred on 02/02/17.  Patient was last seen by Primary Care Provider on 02/02/17.   Patient reports Diabetes was diagnosed about 7 years ago.  Patient reports adherence with medications.  Current diabetes medications include: Levemir 15 units in the morning and 10 units in the evening, and metformin 1000 mg BID.   Patient reports hypoglycemic events. She woke up with a reading of 48 and another of 61. She ate yogurt to treat the episode.  Patient reported dietary habits: tries to watch what she eats to avoid gaining weight.  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  Patient denies neuropathy. Patient denies visual changes. Patient reports self foot exams.    O:  Physical Exam   ROS   Lab Results  Component Value Date   HGBA1C >15.5 (H) 01/18/2017   There were no vitals filed for this visit.  Home fasting CBG: 48-150s 2 hour post-prandial/random CBG: 150s (one 300 after cheese fries)   A/P: Diabetes longstanding currently UNcontrolled based on A1c of >15.5 and hypoglycemia. Patient reports hypoglycemic events and is able to verbalize appropriate hypoglycemia management plan (will drink juice or something fast acting rather than eat yogurt). Patient reports adherence with medication. Control is suboptimal due to hypoglycemia  Continue metformin 1000 mg BID. Continue Levemir 15 units in the morning and decrease evening dose to 10 units. Hopefully this will get rid of any hypoglycemia. Stressed to patient to call with any readings <70 so we can titrate her insulin if needed. Patient verbalized understanding.   Next A1C anticipated July 2018.    Written patient instructions provided.  Total time in face to face counseling 20  minutes.   Follow up in Pharmacist Clinic Visit in 2 weeks.   Patient seen with Susy FrizzleNicole Dunn, PharmD Candidate

## 2017-04-26 MED FILL — metFORMIN HCL 1000 MG TABS: 1000 | 30 days supply | Qty: 60 | Fill #2

## 2017-04-26 MED FILL — ATENOLOL 50 MG TABLET: 50 | 30 days supply | Qty: 30 | Fill #2

## 2017-04-26 MED FILL — LISINOPRIL 2.5 MG TABLET: 2.5 | 30 days supply | Qty: 30 | Fill #2

## 2017-05-25 MED FILL — $LANTUS SOLOSTAR 100 UNITS/: 100 | 24 days supply | Qty: 6 | Fill #1

## 2017-06-01 ENCOUNTER — Other Ambulatory Visit: Payer: Self-pay | Admitting: Family Medicine

## 2017-06-01 ENCOUNTER — Other Ambulatory Visit: Payer: Self-pay | Admitting: Internal Medicine

## 2017-06-01 DIAGNOSIS — R809 Proteinuria, unspecified: Secondary | ICD-10-CM

## 2017-06-01 MED FILL — TRUEPLUS PEN NDL 32GX5/32: 32GX 5/32" | 30 days supply | Qty: 100 | Fill #1

## 2017-06-02 MED FILL — ?METFORMIN HCL 1,000 MG TAB: 1000 | 30 days supply | Qty: 60 | Fill #0

## 2017-06-02 MED FILL — LISINOPRIL 2.5 MG TABLET: 2.5 | 30 days supply | Qty: 30 | Fill #0

## 2017-06-02 MED FILL — ATENOLOL 50 MG TABLET: 50 | 30 days supply | Qty: 30 | Fill #0

## 2017-07-14 ENCOUNTER — Other Ambulatory Visit: Payer: Self-pay | Admitting: Family Medicine

## 2017-07-14 DIAGNOSIS — R809 Proteinuria, unspecified: Secondary | ICD-10-CM

## 2017-07-14 MED FILL — $LANTUS SOLOSTAR 100 UNITS/: 100 | 24 days supply | Qty: 6 | Fill #2

## 2017-08-23 ENCOUNTER — Other Ambulatory Visit: Payer: Self-pay | Admitting: Family Medicine

## 2017-08-23 MED FILL — $LANTUS SOLOSTAR 100 UNITS/: 100 | 24 days supply | Qty: 6 | Fill #3

## 2017-10-04 MED FILL — $LANTUS SOLOSTAR 100 UNITS/: 100 | 24 days supply | Qty: 6 | Fill #4

## 2017-10-08 MED FILL — TRUEPLUS PEN NDL 32GX5/32": 32G X 4 MM | 30 days supply | Qty: 100 | Fill #2

## 2017-10-08 MED FILL — TRUEPLUS PEN NDL 32GX5/32: 32G X 4 MM | 30 days supply | Qty: 100 | Fill #2

## 2017-11-23 MED FILL — $LANTUS SOLOSTAR 100 UNITS/: 100 | 24 days supply | Qty: 6 | Fill #5

## 2018-01-07 MED FILL — $LANTUS SOLOSTAR 100 UNITS/: 100 | 24 days supply | Qty: 6 | Fill #6

## 2018-02-21 MED FILL — TRUEPLUS PEN NDL 32GX5/32: 32G X 4 MM | 30 days supply | Qty: 100 | Fill #3

## 2018-02-21 MED FILL — TRUEPLUS PEN NDL 32GX5/32": 32G X 4 MM | 30 days supply | Qty: 100 | Fill #3

## 2018-02-23 MED FILL — $LANTUS SOLOSTAR 100 UNITS/: 100 | 24 days supply | Qty: 6 | Fill #7

## 2018-06-21 ENCOUNTER — Ambulatory Visit: Payer: Self-pay | Admitting: Family Medicine

## 2019-02-01 IMAGING — US US ABDOMEN COMPLETE
1 series · 14 of 25 positions shown · non-contrast
Comparison: Abdominal ultrasound 04/29/2010

CLINICAL DATA: Elevated liver function tests.

EXAM:
ABDOMEN ULTRASOUND COMPLETE

[Series 1: us abdomen complete · 0.22mm/px · 14 of 79 slices shown]
[im 1/79]
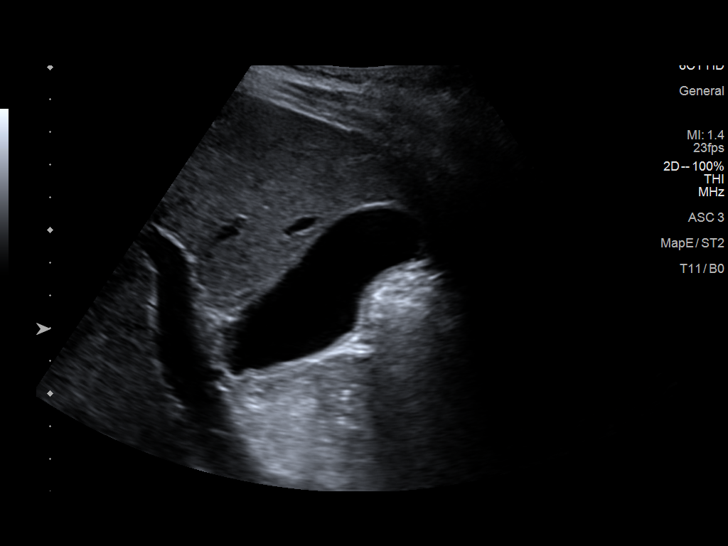
[im 7/79]
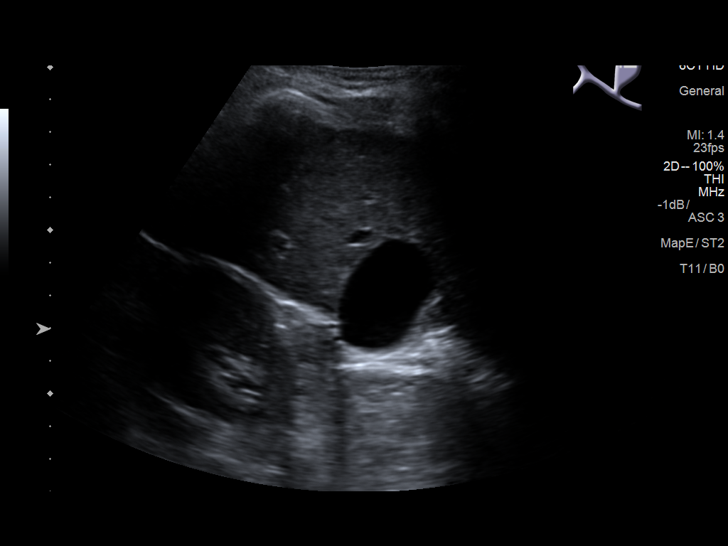
[im 14/79]
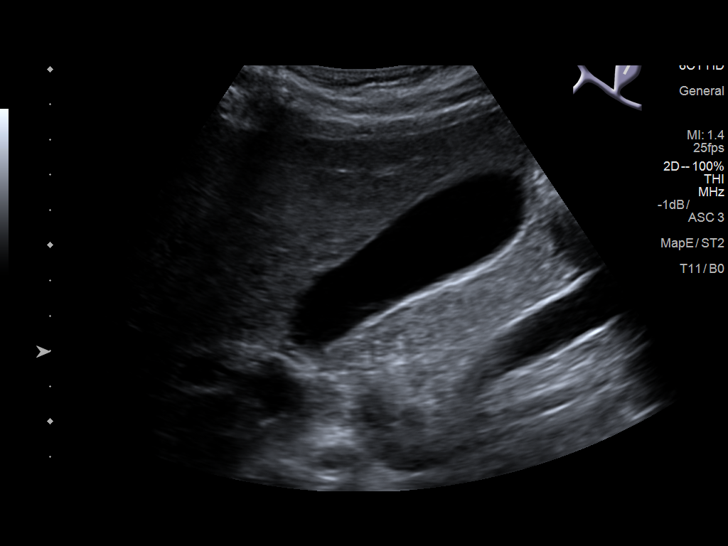
[im 20/79]
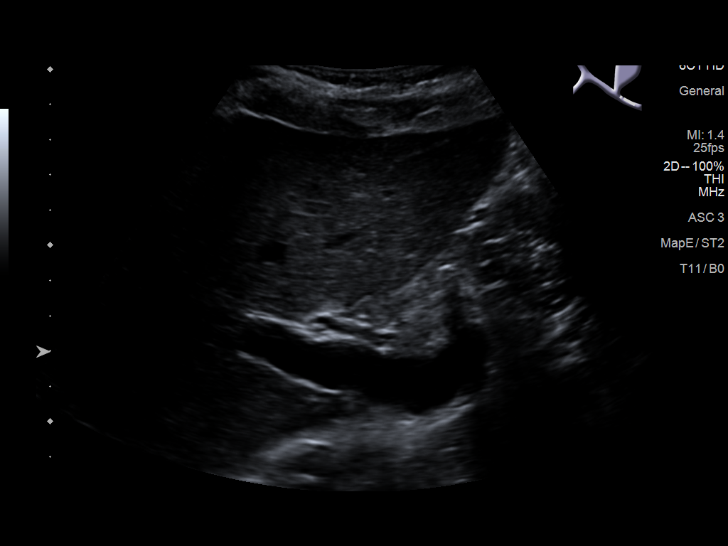
[im 27/79]
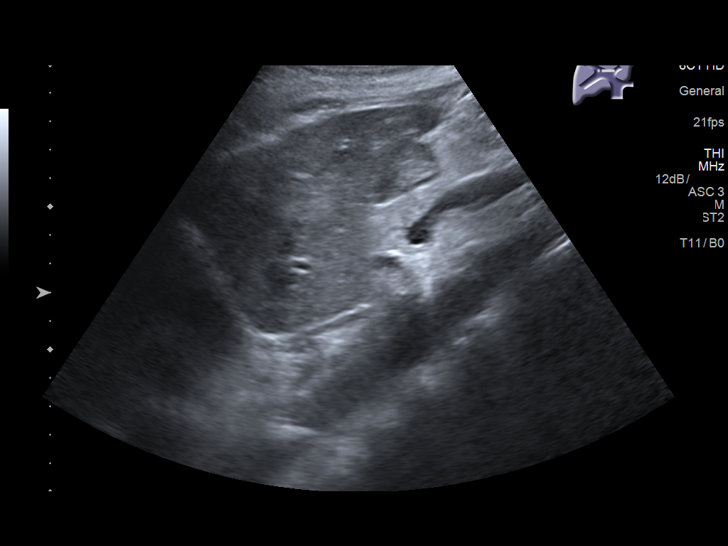
[im 30/79]
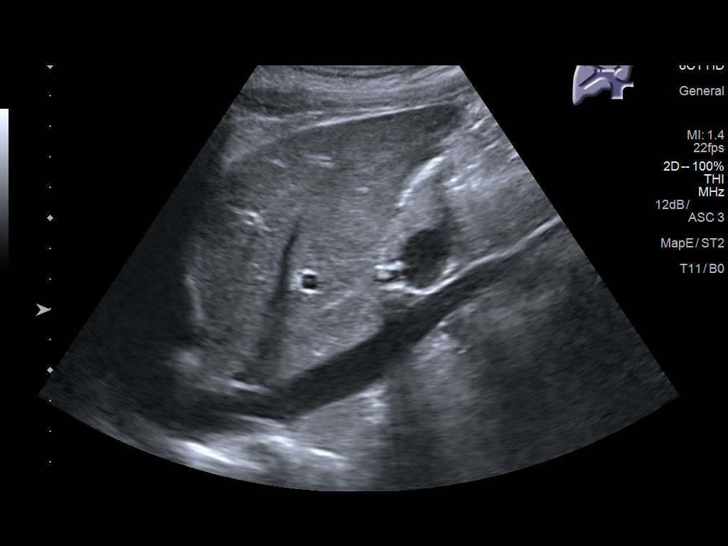
[im 36/79]
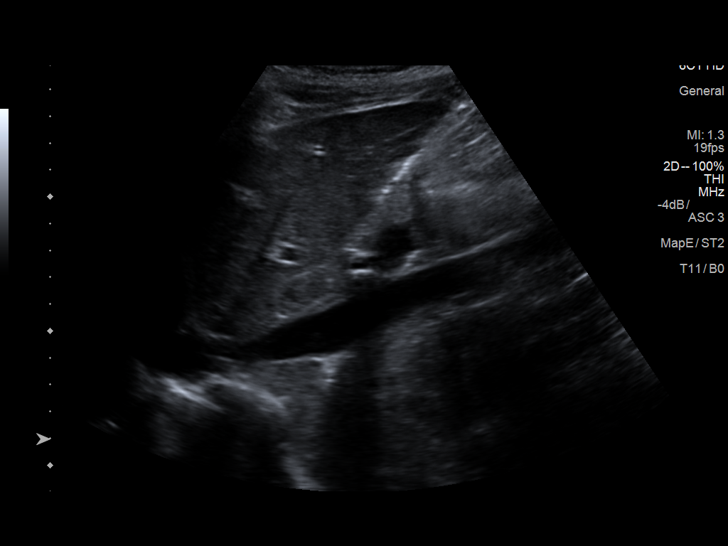
[im 43/79]
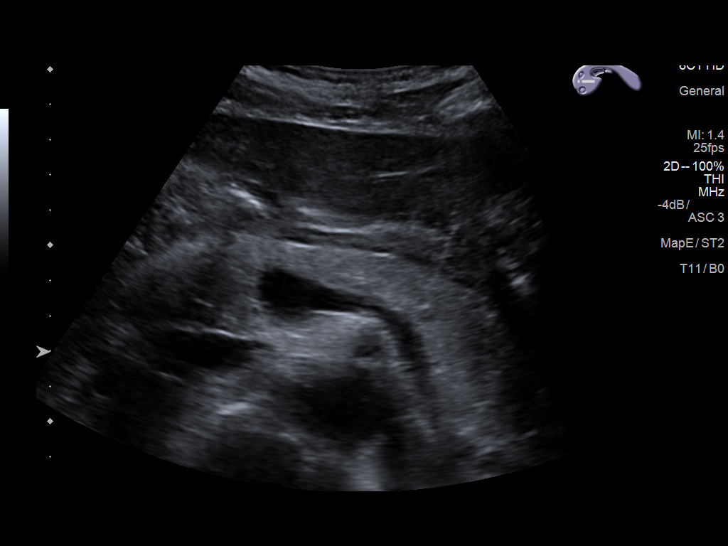
[im 49/79]
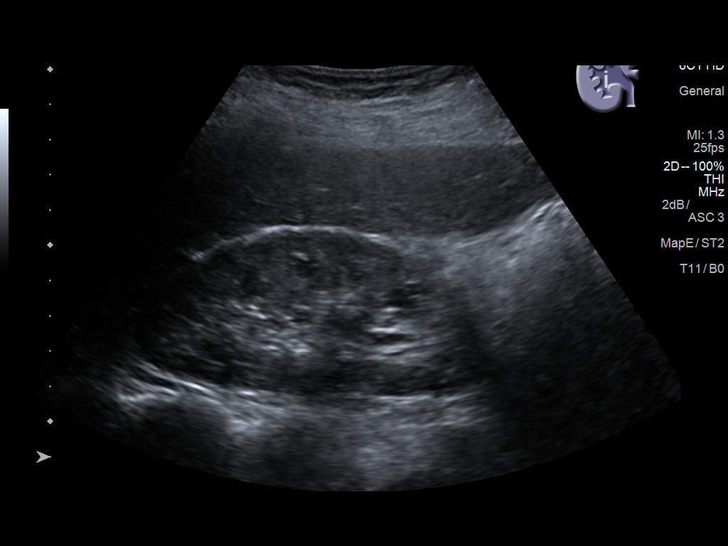
[im 53/79]
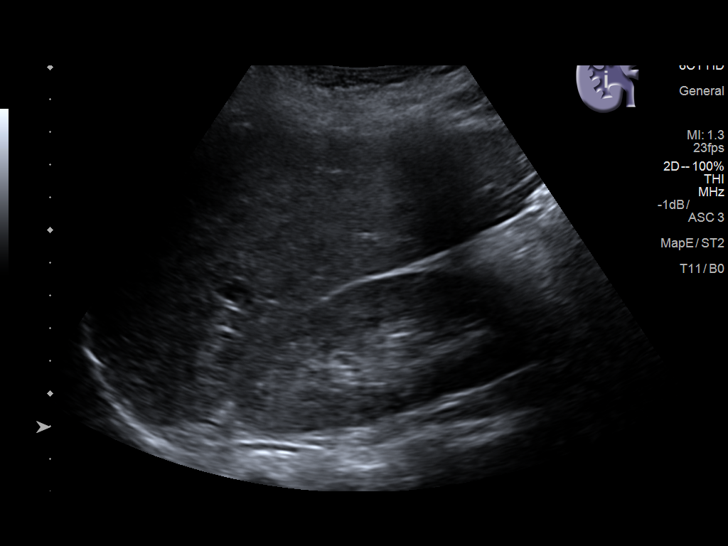
[im 59/79]
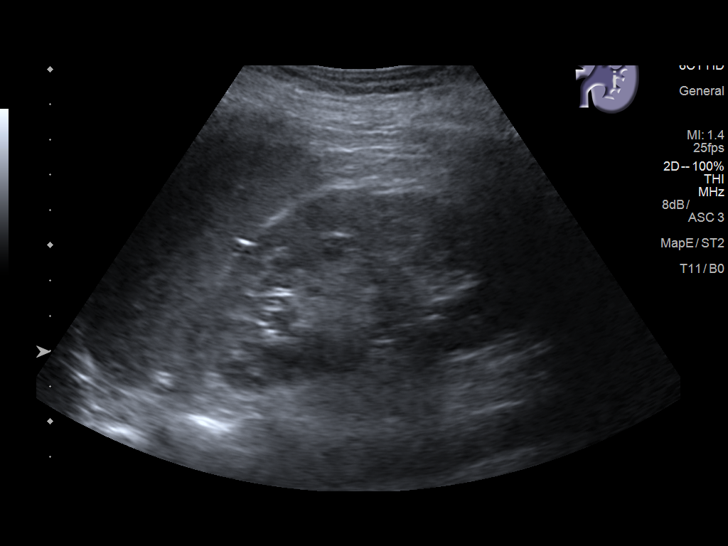
[im 66/79]
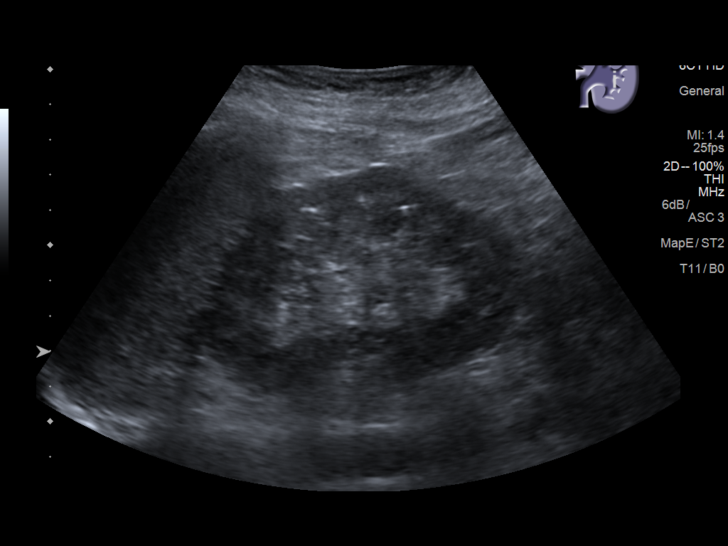
[im 72/79]
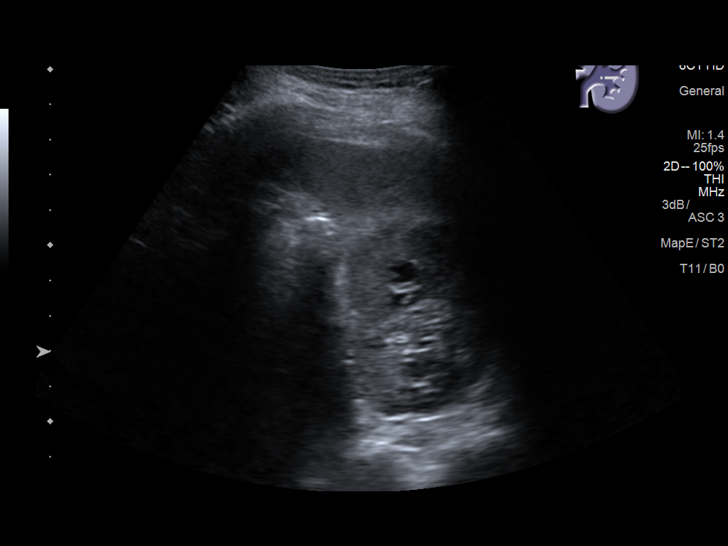
[im 79/79]
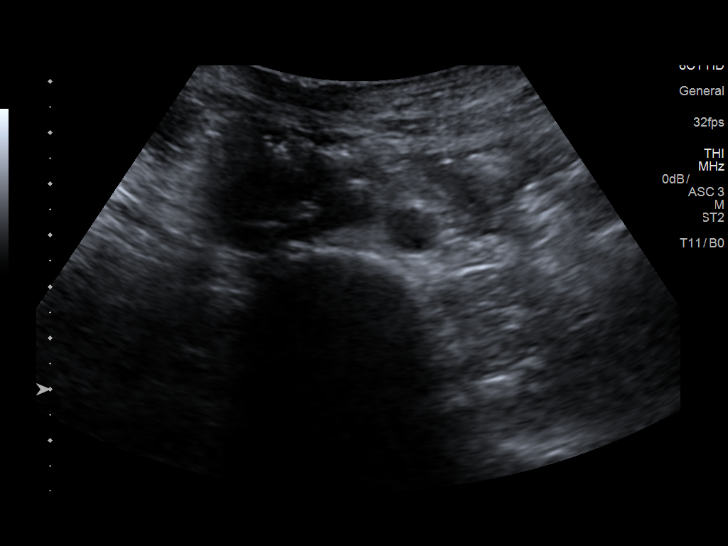

[14 of 25 positions shown; findings below may reference images not displayed]

FINDINGS: Gallbladder: No gallstones or wall thickening visualized. No
sonographic Murphy sign noted by sonographer.

Common bile duct: Diameter: 0.4 cm

Liver: No focal lesion identified. Within normal limits in
parenchymal echogenicity.

IVC: No abnormality visualized.

Pancreas: Visualized portion unremarkable.

Spleen: Size and appearance within normal limits.

Right Kidney: Length: 10.8 cm. Echogenicity within normal limits. No
mass or hydronephrosis visualized.

Left Kidney: Length: 10.5 cm. Echogenicity within normal limits. Two
cysts are identified. One has a thin septation. Both cysts measure
approximately 1.2 cm in diameter. No hydronephrosis visualized.

Abdominal aorta: No aneurysm visualized.

Other findings: None.
IMPRESSION: No acute abnormality.  The gallbladder and liver appear normal.

## 2019-12-02 ENCOUNTER — Ambulatory Visit: Payer: BC Managed Care – PPO | Attending: Internal Medicine

## 2019-12-02 DIAGNOSIS — Z23 Encounter for immunization: Secondary | ICD-10-CM

## 2019-12-02 NOTE — Progress Notes (Signed)
   Covid-19 Vaccination Clinic  Name:  Kayla Erickson    MRN: [id number] DOB: 1981-07-23  12/02/2019  [name] was observed post Covid-19 immunization for 15 minutes without incidence. She was provided with Vaccine Information Sheet and instruction to access the V-Safe system.   [name] was instructed to call 911 with any severe reactions post vaccine: Marland Kitchen Difficulty breathing  . Swelling of your face and throat  . A fast heartbeat  . A bad rash all over your body  . Dizziness and weakness    Immunizations Administered    Name Date Dose VIS Date Route   Pfizer COVID-19 Vaccine 12/02/2019  3:25 PM 0.3 mL 09/29/2019 Intramuscular   Manufacturer: [employer], [employer]   Lot: [id number]   NDC: [address]-2

## 2019-12-25 ENCOUNTER — Ambulatory Visit: Payer: BC Managed Care – PPO | Attending: Internal Medicine

## 2019-12-25 DIAGNOSIS — Z23 Encounter for immunization: Secondary | ICD-10-CM | POA: Insufficient documentation

## 2019-12-25 NOTE — Progress Notes (Signed)
   Covid-19 Vaccination Clinic  Name:  Kayla Erickson    MRN: [id number] DOB: 06-25-1981  12/25/2019  [name] was observed post Covid-19 immunization for 15 minutes without incident. She was provided with Vaccine Information Sheet and instruction to access the V-Safe system.   [name] was instructed to call 911 with any severe reactions post vaccine: Marland Kitchen Difficulty breathing  . Swelling of face and throat  . A fast heartbeat  . A bad rash all over body  . Dizziness and weakness   Immunizations Administered    Name Date Dose VIS Date Route   Pfizer COVID-19 Vaccine 12/25/2019  4:50 PM 0.3 mL 09/29/2019 Intramuscular   Manufacturer: [employer], [employer]   Lot: [id number]   NDC: [address]-2

## 2022-04-28 ENCOUNTER — Encounter: Payer: Self-pay | Admitting: Family

## 2022-04-28 ENCOUNTER — Ambulatory Visit (INDEPENDENT_AMBULATORY_CARE_PROVIDER_SITE_OTHER): Payer: 59 | Admitting: Family

## 2022-04-28 VITALS — BP 122/76 | HR 100 | Temp 98.7°F | Resp 16 | Ht 63.75 in | Wt 151.0 lb

## 2022-04-28 DIAGNOSIS — E11319 Type 2 diabetes mellitus with unspecified diabetic retinopathy without macular edema: Secondary | ICD-10-CM

## 2022-04-28 DIAGNOSIS — I10 Essential (primary) hypertension: Secondary | ICD-10-CM | POA: Diagnosis not present

## 2022-04-28 DIAGNOSIS — R809 Proteinuria, unspecified: Secondary | ICD-10-CM

## 2022-04-28 DIAGNOSIS — F900 Attention-deficit hyperactivity disorder, predominantly inattentive type: Secondary | ICD-10-CM

## 2022-04-28 DIAGNOSIS — E118 Type 2 diabetes mellitus with unspecified complications: Secondary | ICD-10-CM | POA: Diagnosis not present

## 2022-04-28 DIAGNOSIS — Z13220 Encounter for screening for lipoid disorders: Secondary | ICD-10-CM | POA: Diagnosis not present

## 2022-04-28 DIAGNOSIS — R7989 Other specified abnormal findings of blood chemistry: Secondary | ICD-10-CM

## 2022-04-28 HISTORY — DX: Proteinuria, unspecified: R80.9

## 2022-04-28 MED ORDER — LISINOPRIL 10 MG PO TABS: 10.0000 mg | ORAL_TABLET | Freq: Every day | ORAL | 1 refills | Status: DC

## 2022-04-28 NOTE — Progress Notes (Signed)
New Patient Office Visit  Subjective:  Patient ID: Kayla Erickson, female    DOB: May 19, 1981  Age: 41 y.o. MRN: [id number]  CC:  Chief Complaint  Patient presents with   Establish Care    HPI Kayla Erickson is here to establish care as a new patient.  Prior provider was: Jimmie Molly, MD at [facility]  Pt is without acute concerns.   Pap smear: last year sometime? Negative?   chronic concerns:  HTN: pretty good, lisinopril 10 mg once daily. Tolerating well.   DM2: metformin 1000 mg twice daily. Trulicity 0.75 mg once weekly. Tolerating well. Admits to not checking sugars as she should. Last two days with fasting 260 and 280.  Has h/o DKA. Has not seen eye doctor in over one year.   Sinus tachycardia: no recent episodes. Has been off of atenolol a few years ago. Never saw cardiologist, she thinks was due to electrolytes being out of balance and after DKA. Has not had any episodes in over a few years.   ADHD: diagnosed at age 71 with this. Has tried concerta and ritalin in the past. Has not since been on this since age 17 or so. Adderall was in twenties. Work at [employer] in [employer], Sports administrator. Hard to focus at times with work. Interested in maybe trying concerta again.  Past Medical History:  Diagnosis Date   Allergic rhinitis    Diabetes mellitus type 2 with complications (HCC)    DKA (diabetic ketoacidoses) 01/15/2017   Hypertension    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)    SINUS TACHYCARDIA 11/24/2007   Qualifier: Diagnosis of  By: Gwenlyn Perking MD, Mikle Bosworth      Past Surgical History:  Procedure Laterality Date   WISDOM TOOTH EXTRACTION      Family History  Problem Relation Age of Onset   Diabetes Paternal Grandmother     Social History   Socioeconomic History   Marital status: Significant Other    Spouse name: Not on file   Number of children: 1   Years of education: 16   Highest education level: Not on file   Occupational History   Not on file  Tobacco Use   Smoking status: Never   Smokeless tobacco: Never  Vaping Use   Vaping Use: Never used  Substance and Sexual Activity   Alcohol use: Yes    Alcohol/week: 2.0 standard drinks of alcohol    Types: 2 Cans of beer per week    Comment: socially   Drug use: No   Sexual activity: Yes    Partners: Female    Birth control/protection: Other-see comments    Comment: same sex  Other Topics Concern   Not on file  Social History Narrative   Single, Never smoked, but with passive smoke exposure (her mom is a smoker). Traveled to [place] 3 years ago.  No tattoos, No IVDA, No dietary supplements, No blood transfusion.       71 y/o child , did not birth    Sig other did    Social Determinants of Corporate investment banker Strain: Not on file  Food Insecurity: Not on file  Transportation Needs: Not on file  Physical Activity: Not on file  Stress: Not on file  Social Connections: Not on file  Intimate Partner Violence: Not on file    Outpatient Medications Prior to Visit  Medication Sig Dispense Refill   metFORMIN (GLUCOPHAGE) 1000 MG tablet TAKE 1 TABLET  BY MOUTH 2 TIMES DAILY WITH A MEAL. 60 tablet 0   Dulaglutide (TRULICITY) 0.75 MG/0.5ML SOPN INJECT 0.5 MLS (0.75 MG DOSE) INTO THE SKIN ONCE A WEEK.     lisinopril (ZESTRIL) 10 MG tablet Take 1 tablet by mouth daily.     atenolol (TENORMIN) 50 MG tablet TAKE 1 TABLET BY MOUTH DAILY. 30 tablet 0   Insulin Glargine (LANTUS SOLOSTAR) 100 UNIT/ML Solostar Pen INJECT 15 UNITS INTO THE SKIN IN THE MORNING AND 10 UNITS IN THE EVENING 15 mL 3   Insulin Pen Needle (TRUEPLUS PEN NEEDLES) 32G X 4 MM MISC Use as directed to inject insulin 100 each 5   Insulin Syringe-Needle U-100 (TRUEPLUS INSULIN SYRINGE) 31G X 5/16" 0.3 ML MISC 1 each by Does not apply route 3 (three) times daily. 100 each 12   lisinopril (PRINIVIL,ZESTRIL) 2.5 MG tablet TAKE 1 TABLET BY MOUTH DAILY. (Patient not taking: Reported on  04/28/2022) 30 tablet 0   No facility-administered medications prior to visit.    No Known Allergies      Objective:    Physical Exam  Gen: NAD, resting comfortably CV: RRR with no murmurs appreciated Pulm: NWOB, CTAB with no crackles, wheezes, or rhonchi Skin: warm, dry Psych: Normal affect and thought content  BP 122/76   Pulse 100   Temp 98.7 F (37.1 C)   Resp 16   Ht 5' 3.75" (1.619 m)   Wt 151 lb (68.5 kg)   LMP 04/02/2022 (Approximate)   SpO2 98%   BMI 26.12 kg/m  Wt Readings from Last 3 Encounters:  04/28/22 151 lb (68.5 kg)  02/02/17 173 lb 3.2 oz (78.6 kg)  01/21/17 153 lb 12.8 oz (69.8 kg)     Health Maintenance Due  Topic Date Due   PAP SMEAR-Modifier  Never done   FOOT EXAM  12/27/2012   OPHTHALMOLOGY EXAM  01/12/2014   COVID-19 Vaccine (3 - Pfizer series) 02/19/2020   TETANUS/TDAP  12/27/2021    There are no preventive care reminders to display for this patient.  Lab Results  Component Value Date   TSH 0.68 04/28/2022   Lab Results  Component Value Date   WBC 5.7 04/28/2022   HGB 13.5 04/28/2022   HCT 38.6 04/28/2022   MCV 92.6 04/28/2022   PLT 231.0 04/28/2022   Lab Results  Component Value Date   NA 136 04/28/2022   K 5.0 04/28/2022   CO2 28 04/28/2022   GLUCOSE 181 (H) 04/28/2022   BUN 29 (H) 04/28/2022   CREATININE 1.45 (H) 04/28/2022   BILITOT 0.5 04/28/2022   ALKPHOS 94 04/28/2022   AST 59 (H) 04/28/2022   ALT 79 (H) 04/28/2022   PROT 7.1 04/28/2022   ALBUMIN 4.5 04/28/2022   CALCIUM 9.6 04/28/2022   ANIONGAP 9 01/19/2017   GFR 45.46 (L) 04/28/2022   Lab Results  Component Value Date   CHOL 165 04/28/2022   Lab Results  Component Value Date   HDL 65.50 04/28/2022   Lab Results  Component Value Date   LDLCALC 78 04/28/2022   Lab Results  Component Value Date   TRIG 111.0 04/28/2022   Lab Results  Component Value Date   CHOLHDL 3 04/28/2022   Lab Results  Component Value Date   HGBA1C 8.8 (H)  04/28/2022      Assessment & Plan:   Problem List Items Addressed This Visit       Cardiovascular and Mediastinum   Essential hypertension - Primary    Continue  lisinopril 10 mg once daily. Pt advised of the following:  Continue medication as prescribed. Monitor blood pressure periodically and/or when you feel symptomatic. Goal is <130/90 on average. Ensure that you have rested for 30 minutes prior to checking your blood pressure. Record your readings and bring them to your next visit if necessary.work on a low sodium diet.       Relevant Medications   lisinopril (ZESTRIL) 10 MG tablet   Other Relevant Orders   Ambulatory referral to Ophthalmology   Comprehensive metabolic panel (Completed)   CBC with Differential/Platelet (Completed)   Microalbumin / creatinine urine ratio (Completed)     Endocrine   Diabetes mellitus type 2 with complications (HCC)    Ordered hga1c today pending results. Work on diabetic diet and exercise as tolerated. Yearly foot exam, and annual eye exam.        Relevant Medications   lisinopril (ZESTRIL) 10 MG tablet   Other Relevant Orders   Ambulatory referral to Ophthalmology   CBC with Differential/Platelet (Completed)   Hemoglobin A1c (Completed)   Diabetic retinopathy (HCC)    Continue annual eye exam with opthamologist      Relevant Medications   lisinopril (ZESTRIL) 10 MG tablet   Other Relevant Orders   Ambulatory referral to Ophthalmology   Hemoglobin A1c (Completed)     Other   Urine test positive for microalbuminuria    Continue lisinopril      Elevated liver function tests    Repeat hepatic function test pending results      Screening for lipoid disorders    Lipid panel ordered pending results.        Relevant Orders   Lipid panel (Completed)   Low phosphate levels    Repeat phosphate today pending results      Relevant Orders   Phosphorus (Completed)   Hypomagnesemia    Repeat magnesium today pending results       Relevant Orders   Magnesium (Completed)   Attention deficit hyperactivity disorder (ADHD), predominantly inattentive type    Can consider vyvanse / concerta pending f/u and lab results      Relevant Orders   TSH (Completed)    Meds ordered this encounter  Medications   lisinopril (ZESTRIL) 10 MG tablet    Sig: Take 1 tablet (10 mg total) by mouth daily.    Dispense:  90 tablet    Refill:  1    Order Specific Question:   Supervising Provider    Answer:   Ermalene Searing, AMY E [[id number]]    Follow-up: Return in about 1 month (around 05/29/2022) for follow up on labs and medication .    Mort Sawyers, FNP

## 2022-04-29 ENCOUNTER — Other Ambulatory Visit: Payer: Self-pay | Admitting: Family

## 2022-04-29 ENCOUNTER — Telehealth: Payer: Self-pay

## 2022-04-29 ENCOUNTER — Encounter: Payer: Self-pay | Admitting: Family

## 2022-04-29 DIAGNOSIS — E118 Type 2 diabetes mellitus with unspecified complications: Secondary | ICD-10-CM

## 2022-04-29 DIAGNOSIS — R7989 Other specified abnormal findings of blood chemistry: Secondary | ICD-10-CM

## 2022-04-29 LAB — LIPID PANEL
Cholesterol: 165 mg/dL (ref 0–200)
HDL: 65.5 mg/dL (ref >39.00)
LDL Cholesterol: 78 mg/dL (ref 0–99)
NonHDL: 99.84
Total CHOL/HDL Ratio: 3
Triglycerides: 111 mg/dL (ref 0.0–149.0)
VLDL: 22.2 mg/dL (ref 0.0–40.0)

## 2022-04-29 LAB — COMPREHENSIVE METABOLIC PANEL
ALT: 79 U/L — ABNORMAL HIGH (ref 0–35)
AST: 59 U/L — ABNORMAL HIGH (ref 0–37)
Albumin: 4.5 g/dL (ref 3.5–5.2)
Alkaline Phosphatase: 94 U/L (ref 39–117)
BUN: 29 mg/dL — ABNORMAL HIGH (ref 6–23)
CO2: 28 mEq/L (ref 19–32)
Calcium: 9.6 mg/dL (ref 8.4–10.5)
Chloride: 98 mEq/L (ref 96–112)
Creatinine, Ser: 1.45 mg/dL — ABNORMAL HIGH (ref 0.40–1.20)
GFR: 45.46 mL/min — ABNORMAL LOW (ref >60.00)
Glucose, Bld: 181 mg/dL — ABNORMAL HIGH (ref 70–99)
Potassium: 5 mEq/L (ref 3.5–5.1)
Sodium: 136 mEq/L (ref 135–145)
Total Bilirubin: 0.5 mg/dL (ref 0.2–1.2)
Total Protein: 7.1 g/dL (ref 6.0–8.3)

## 2022-04-29 LAB — MICROALBUMIN / CREATININE URINE RATIO
Creatinine,U: 31.6 mg/dL
Microalb Creat Ratio: 2.2 mg/g (ref 0.0–30.0)
Microalb, Ur: <0.7 mg/dL (ref 0.0–1.9)

## 2022-04-29 LAB — CBC WITH DIFFERENTIAL/PLATELET
Basophils Absolute: 0 10*3/uL (ref 0.0–0.1)
Basophils Relative: 0.6 % (ref 0.0–3.0)
Eosinophils Absolute: 0 10*3/uL (ref 0.0–0.7)
Eosinophils Relative: 0.7 % (ref 0.0–5.0)
HCT: 38.6 % (ref 36.0–46.0)
Hemoglobin: 13.5 g/dL (ref 12.0–15.0)
Lymphocytes Relative: 27.1 % (ref 12.0–46.0)
Lymphs Abs: 1.5 10*3/uL (ref 0.7–4.0)
MCHC: 34.9 g/dL (ref 30.0–36.0)
MCV: 92.6 fl (ref 78.0–100.0)
Monocytes Absolute: 0.3 10*3/uL (ref 0.1–1.0)
Monocytes Relative: 4.8 % (ref 3.0–12.0)
Neutro Abs: 3.8 10*3/uL (ref 1.4–7.7)
Neutrophils Relative %: 66.8 % (ref 43.0–77.0)
Platelets: 231 10*3/uL (ref 150.0–400.0)
RBC: 4.17 Mil/uL (ref 3.87–5.11)
RDW: 12.1 % (ref 11.5–15.5)
WBC: 5.7 10*3/uL (ref 4.0–10.5)

## 2022-04-29 LAB — MAGNESIUM: Magnesium: 1 mg/dL — CL (ref 1.5–2.5)

## 2022-04-29 LAB — TSH: TSH: 0.68 u[IU]/mL (ref 0.35–5.50)

## 2022-04-29 LAB — HEMOGLOBIN A1C: Hgb A1c MFr Bld: 8.8 % — ABNORMAL HIGH (ref 4.6–6.5)

## 2022-04-29 MED ORDER — TRULICITY 1.5 MG/0.5ML ~~LOC~~ SOAJ: 1.5000 mg | SUBCUTANEOUS | 2 refills | Status: DC

## 2022-04-29 MED ORDER — MAGNESIUM OXIDE 400 MG PO CAPS: 400.0000 mg | ORAL_CAPSULE | Freq: Two times a day (BID) | ORAL | 0 refills | Status: DC

## 2022-04-29 NOTE — Progress Notes (Signed)
Sent in [place] oxide 400 mg to be taken twice daily with food.  Kidneys are stressed, low filtration rate and elevated creatinine. Has pt been hydrating with water? Is she having any urinary symptoms? Frequency, urgency, dysuria? Please have her come in and leave urine sample if able.   Liver function also slightly elevated. I do see this was also in the past. Are you drinking any alcohol? Do you take tylenol and or ibuprofen on a regular basis? Do you drink herbal teas and or take herbal supplements?  A1c has decreased from five years ago but still not at goal. Let's increase trulicity to 1.5 [place] once weekly. At f/u around 8/11 we will discuss and look over numbers.

## 2022-04-29 NOTE — Telephone Encounter (Signed)
Critical low of magnesium at 1.0    Is pt having any of the following?  Fatigue. Loss of appetite.  Muscle spasms.  Nausea.  Stiffness.  Weakness.  Wat about any feelings of palpitations or irregular heat beat?  If yes to any.Marland Kitchen go to ER likely need for IV replacement.  If no to all, I will send in supplementation that pt is to start immediately. Have pt lab repeat magnesium x one week

## 2022-04-29 NOTE — Telephone Encounter (Signed)
Saa from [facility] called with critical magnesium lab of 1.0. Message sent to Mort Sawyers, NP.

## 2022-04-29 NOTE — Progress Notes (Signed)
Can we try to add hepatitis panel? Added as future order.  Disregard hepatic function panel repeat, this will be in a few weeks.

## 2022-04-29 NOTE — Progress Notes (Signed)
Magox

## 2022-04-29 NOTE — Telephone Encounter (Signed)
Responded via lab results.

## 2022-04-30 ENCOUNTER — Other Ambulatory Visit (INDEPENDENT_AMBULATORY_CARE_PROVIDER_SITE_OTHER): Payer: 59

## 2022-04-30 DIAGNOSIS — R7989 Other specified abnormal findings of blood chemistry: Secondary | ICD-10-CM | POA: Diagnosis not present

## 2022-04-30 LAB — PHOSPHORUS: Phosphorus: 3.7 mg/dL (ref 2.3–4.6)

## 2022-05-01 LAB — HEPATITIS PANEL, ACUTE
Hep A IgM: NONREACTIVE
Hep B C IgM: NONREACTIVE
Hepatitis B Surface Ag: NONREACTIVE
Hepatitis C Ab: NONREACTIVE

## 2022-05-01 NOTE — Assessment & Plan Note (Signed)
Repeat magnesium today pending results

## 2022-05-01 NOTE — Assessment & Plan Note (Signed)
Continue lisinopril

## 2022-05-01 NOTE — Assessment & Plan Note (Signed)
Ordered hga1c today pending results. Work on diabetic diet and exercise as tolerated. Yearly foot exam, and annual eye exam.   

## 2022-05-01 NOTE — Assessment & Plan Note (Signed)
Continue annual eye exam with opthamologist

## 2022-05-01 NOTE — Assessment & Plan Note (Signed)
Repeat hepatic function test pending results

## 2022-05-01 NOTE — Assessment & Plan Note (Signed)
Lipid panel ordered pending results.   

## 2022-05-01 NOTE — Assessment & Plan Note (Signed)
Repeat phosphate today pending results

## 2022-05-01 NOTE — Assessment & Plan Note (Signed)
Can consider vyvanse / concerta pending f/u and lab results

## 2022-05-01 NOTE — Assessment & Plan Note (Signed)
Continue lisinopril 10 mg once daily  Pt advised of the following:  Continue medication as prescribed. Monitor blood pressure periodically and/or when you feel symptomatic. Goal is <130/90 on average. Ensure that you have rested for 30 minutes prior to checking your blood pressure. Record your readings and bring them to your next visit if necessary.work on a low sodium diet.  

## 2022-05-06 ENCOUNTER — Other Ambulatory Visit: Payer: BC Managed Care – PPO

## 2022-05-11 ENCOUNTER — Other Ambulatory Visit (INDEPENDENT_AMBULATORY_CARE_PROVIDER_SITE_OTHER): Payer: 59

## 2022-05-11 ENCOUNTER — Other Ambulatory Visit: Payer: Self-pay | Admitting: Family

## 2022-05-11 DIAGNOSIS — R7989 Other specified abnormal findings of blood chemistry: Secondary | ICD-10-CM

## 2022-05-11 LAB — HEPATIC FUNCTION PANEL
ALT: 56 U/L — ABNORMAL HIGH (ref 0–35)
AST: 50 U/L — ABNORMAL HIGH (ref 0–37)
Albumin: 4.4 g/dL (ref 3.5–5.2)
Alkaline Phosphatase: 68 U/L (ref 39–117)
Bilirubin, Direct: 0.1 mg/dL (ref 0.0–0.3)
Total Bilirubin: 0.4 mg/dL (ref 0.2–1.2)
Total Protein: 6.8 g/dL (ref 6.0–8.3)

## 2022-05-11 LAB — MAGNESIUM: Magnesium: 1.2 mg/dL — ABNORMAL LOW (ref 1.5–2.5)

## 2022-05-11 MED ORDER — MAGNESIUM OXIDE 400 MG PO CAPS: 400.0000 mg | ORAL_CAPSULE | Freq: Two times a day (BID) | ORAL | 0 refills | Status: DC

## 2022-05-11 NOTE — Progress Notes (Signed)
How is pt tolerating the magnesium, only slight improvement. Keep taking 400 mg twice daily. Repeat magnesium level in one month.   Liver function also still mildly elevated. Ordering US abdomen, to be done at GI on [address]. Please advise pt to call to make appt.

## 2022-05-12 ENCOUNTER — Encounter: Payer: Self-pay | Admitting: *Deleted

## 2022-05-12 ENCOUNTER — Telehealth: Payer: Self-pay | Admitting: Family

## 2022-05-12 LAB — URINE CULTURE
MICRO NUMBER:: 13685594
SPECIMEN QUALITY:: ADEQUATE

## 2022-05-12 NOTE — Telephone Encounter (Signed)
Patient called back about message below:   Vertis Kelch, [facility]  05/12/2022  3:29 PM EDT Back to Top    Left message to return call to our office.    Mort Sawyers, FNP  05/11/2022  4:39 PM EDT     How is pt tolerating the magnesium, only slight improvement. Keep taking 400 mg twice daily. Repeat magnesium level in one month.    Liver function also still mildly elevated. Ordering US abdomen, to be done at GI on [address]. Please advise pt to call to make appt.    She said shes doing fine with the magnesium, she will repeat in a month. She already scheduled [place] for tomorrow morning. She scheduled follow up on 06/15/22

## 2022-05-13 ENCOUNTER — Ambulatory Visit
Admission: RE | Admit: 2022-05-13 | Discharge: 2022-05-13 | Disposition: A | Discharge status: Home / Self Care | Payer: BC Managed Care – PPO | Source: Ambulatory Visit | Attending: Family | Admitting: Family

## 2022-05-13 DIAGNOSIS — R945 Abnormal results of liver function studies: Secondary | ICD-10-CM | POA: Diagnosis not present

## 2022-05-13 DIAGNOSIS — R7989 Other specified abnormal findings of blood chemistry: Secondary | ICD-10-CM

## 2022-05-13 NOTE — Telephone Encounter (Signed)
Noted  

## 2022-05-29 ENCOUNTER — Ambulatory Visit: Payer: BC Managed Care – PPO | Admitting: Family

## 2022-05-29 ENCOUNTER — Encounter: Payer: Self-pay | Admitting: Family

## 2022-06-02 MED ORDER — METFORMIN HCL 1000 MG PO TABS: 1000.0000 mg | ORAL_TABLET | Freq: Two times a day (BID) | ORAL | 1 refills | Status: DC

## 2022-06-03 ENCOUNTER — Other Ambulatory Visit: Payer: Self-pay | Admitting: Family

## 2022-06-15 ENCOUNTER — Encounter: Payer: Self-pay | Admitting: Family

## 2022-06-15 ENCOUNTER — Ambulatory Visit (INDEPENDENT_AMBULATORY_CARE_PROVIDER_SITE_OTHER): Payer: 59 | Admitting: Family

## 2022-06-15 VITALS — BP 116/72 | HR 107 | Temp 98.3°F | Resp 16 | Ht 63.75 in | Wt 147.1 lb

## 2022-06-15 DIAGNOSIS — J301 Allergic rhinitis due to pollen: Secondary | ICD-10-CM | POA: Insufficient documentation

## 2022-06-15 DIAGNOSIS — I10 Essential (primary) hypertension: Secondary | ICD-10-CM

## 2022-06-15 DIAGNOSIS — E118 Type 2 diabetes mellitus with unspecified complications: Secondary | ICD-10-CM

## 2022-06-15 DIAGNOSIS — R748 Abnormal levels of other serum enzymes: Secondary | ICD-10-CM | POA: Insufficient documentation

## 2022-06-15 DIAGNOSIS — R7989 Other specified abnormal findings of blood chemistry: Secondary | ICD-10-CM

## 2022-06-15 DIAGNOSIS — F900 Attention-deficit hyperactivity disorder, predominantly inattentive type: Secondary | ICD-10-CM

## 2022-06-15 LAB — COMPREHENSIVE METABOLIC PANEL
ALT: 61 U/L — ABNORMAL HIGH (ref 0–35)
AST: 40 U/L — ABNORMAL HIGH (ref 0–37)
Albumin: 4.6 g/dL (ref 3.5–5.2)
Alkaline Phosphatase: 74 U/L (ref 39–117)
BUN: 27 mg/dL — ABNORMAL HIGH (ref 6–23)
CO2: 31 mEq/L (ref 19–32)
Calcium: 10.5 mg/dL (ref 8.4–10.5)
Chloride: 96 mEq/L (ref 96–112)
Creatinine, Ser: 1.36 mg/dL — ABNORMAL HIGH (ref 0.40–1.20)
GFR: 49.04 mL/min — ABNORMAL LOW (ref >60.00)
Glucose, Bld: 195 mg/dL — ABNORMAL HIGH (ref 70–99)
Potassium: 4.4 mEq/L (ref 3.5–5.1)
Sodium: 135 mEq/L (ref 135–145)
Total Bilirubin: 0.5 mg/dL (ref 0.2–1.2)
Total Protein: 7.1 g/dL (ref 6.0–8.3)

## 2022-06-15 LAB — MAGNESIUM: Magnesium: 1.3 mg/dL — ABNORMAL LOW (ref 1.5–2.5)

## 2022-06-15 MED ORDER — AZELASTINE HCL 0.1 % NA SOLN: 1.0000 | Freq: Two times a day (BID) | NASAL | 12 refills | Status: DC

## 2022-06-15 NOTE — Progress Notes (Signed)
Established Patient Office Visit  Subjective:  Patient ID: Kayla Erickson, female    DOB: Nov 26, 1980  Age: 41 y.o. MRN: [id number]  CC:  Chief Complaint  Patient presents with   Diabetes    HPI [name] is here today for follow up.   Pt is with acute concerns.  Hypomagnesemia: last mag 1.2 ,1 month ago. Still taking 400 mg twice daily.  Will repeat levels today.   ADHD: has been on concerta in the past, but wants to try vyvanse. Diagnosed as a child.  Wt Readings from Last 3 Encounters:  06/15/22 147 lb 2 oz (66.7 kg)  04/28/22 151 lb (68.5 kg)  02/02/17 173 lb 3.2 oz (78.6 kg)   Temp Readings from Last 3 Encounters:  06/15/22 98.3 F (36.8 C)  04/28/22 98.7 F (37.1 C)  02/02/17 98.4 F (36.9 C) (Oral)   BP Readings from Last 3 Encounters:  06/15/22 116/72  04/28/22 122/76  02/02/17 115/75   Pulse Readings from Last 3 Encounters:  06/15/22 (!) 107  04/28/22 100  02/02/17 81    LFT mildly elevated, U/s abdomen ordered. Hepatitis panel negative. Completed US 05/13/22 , was wnl . No acute findings. Does not take nsaids and her herbal supplements. Has stopped drinking completely with ETOH two months. Ago. Maybe had been drinking etoh beer once a week at most. Has been avoiding tylenol as well. Had been taking some sinus meds but has been off since. Aleve only sparingly.   Lab Results  Component Value Date   ALT 56 (H) 05/11/2022   AST 50 (H) 05/11/2022   ALKPHOS 68 05/11/2022   BILITOT 0.4 05/11/2022   Lab Results  Component Value Date   HAV NEG 04/08/2010   HEPAIGM NON-REACTIVE 04/30/2022   HEPBIGM NON-REACTIVE 04/30/2022   HEPCAB NON-REACTIVE 04/30/2022   Allergies: has tried claritin, xyzal and zyrtec. Has tried flonase as well.   DM2: increased trulicity to 1.5 mg [place] qweek.  Average sugar is around 200 at highest fasting, regular basis average is around 130/150.  Overdue for her eye appt for diabetic retionopathy.   Lab Results  Component Value  Date   HGBA1C 8.8 (H) 04/28/2022    Past Medical History:  Diagnosis Date   Allergic rhinitis    Diabetes mellitus type 2 with complications (HCC)    DKA (diabetic ketoacidoses) 01/15/2017   Hypertension    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)    SINUS TACHYCARDIA 11/24/2007   Qualifier: Diagnosis of  By: Gwenlyn Perking MD, Carlos     Urine test positive for microalbuminuria 04/28/2022    Past Surgical History:  Procedure Laterality Date   WISDOM TOOTH EXTRACTION      Family History  Problem Relation Age of Onset   Diabetes Paternal Grandmother     Social History   Socioeconomic History   Marital status: Significant Other    Spouse name: Not on file   Number of children: 1   Years of education: 16   Highest education level: Not on file  Occupational History   Not on file  Tobacco Use   Smoking status: Never   Smokeless tobacco: Never  Vaping Use   Vaping Use: Never used  Substance and Sexual Activity   Alcohol use: Yes    Alcohol/week: 2.0 standard drinks of alcohol    Types: 2 Cans of beer per week    Comment: socially   Drug use: No   Sexual activity: Yes  Partners: Female    Birth control/protection: Other-see comments    Comment: same sex  Other Topics Concern   Not on file  Social History Narrative   Single, Never smoked, but with passive smoke exposure (her mom is a smoker). Traveled to [place] 3 years ago.  No tattoos, No IVDA, No dietary supplements, No blood transfusion.       83 y/o child , did not birth    Sig other did    Social Determinants of Corporate investment banker Strain: Not on file  Food Insecurity: Not on file  Transportation Needs: Not on file  Physical Activity: Not on file  Stress: Not on file  Social Connections: Not on file  Intimate Partner Violence: Not on file    Outpatient Medications Prior to Visit  Medication Sig Dispense Refill   Dulaglutide (TRULICITY) 1.5 MG/0.5ML SOPN  Inject 1.5 mg into the skin once a week. 2 mL 2   lisinopril (ZESTRIL) 10 MG tablet Take 1 tablet (10 mg total) by mouth daily. 90 tablet 1   magnesium oxide (MAG-OX) 400 (240 Mg) MG tablet TAKE 1 TABLET BY MOUTH TWICE A DAY 60 tablet 0   metFORMIN (GLUCOPHAGE) 1000 MG tablet Take 1 tablet (1,000 mg total) by mouth 2 (two) times daily with a meal. 180 tablet 1   No facility-administered medications prior to visit.    No Known Allergies     Objective:    Physical Exam Constitutional:      General: She is not in acute distress.    Appearance: Normal appearance. She is normal weight. She is not ill-appearing, toxic-appearing or diaphoretic.  Cardiovascular:     Rate and Rhythm: Normal rate and regular rhythm.  Pulmonary:     Effort: Pulmonary effort is normal.  Neurological:     General: No focal deficit present.     Mental Status: She is alert and oriented to person, place, and time. Mental status is at baseline.  Psychiatric:        Mood and Affect: Mood normal.        Behavior: Behavior normal.        Thought Content: Thought content normal.        Judgment: Judgment normal.      BP 116/72   Pulse (!) 107   Temp 98.3 F (36.8 C)   Resp 16   Ht 5' 3.75" (1.619 m)   Wt 147 lb 2 oz (66.7 kg)   LMP 06/01/2022   SpO2 98%   BMI 25.45 kg/m  Wt Readings from Last 3 Encounters:  06/15/22 147 lb 2 oz (66.7 kg)  04/28/22 151 lb (68.5 kg)  02/02/17 173 lb 3.2 oz (78.6 kg)     Health Maintenance Due  Topic Date Due   PAP SMEAR-Modifier  Never done   FOOT EXAM  12/27/2012   OPHTHALMOLOGY EXAM  01/12/2014   COVID-19 Vaccine (3 - Pfizer series) 02/19/2020   TETANUS/TDAP  12/27/2021   INFLUENZA VACCINE  05/19/2022    There are no preventive care reminders to display for this patient.  Lab Results  Component Value Date   TSH 0.68 04/28/2022   Lab Results  Component Value Date   WBC 5.7 04/28/2022   HGB 13.5 04/28/2022   HCT 38.6 04/28/2022   MCV 92.6 04/28/2022    PLT 231.0 04/28/2022   Lab Results  Component Value Date   NA 136 04/28/2022   K 5.0 04/28/2022   CO2 28 04/28/2022  GLUCOSE 181 (H) 04/28/2022   BUN 29 (H) 04/28/2022   CREATININE 1.45 (H) 04/28/2022   BILITOT 0.4 05/11/2022   ALKPHOS 68 05/11/2022   AST 50 (H) 05/11/2022   ALT 56 (H) 05/11/2022   PROT 6.8 05/11/2022   ALBUMIN 4.4 05/11/2022   CALCIUM 9.6 04/28/2022   ANIONGAP 9 01/19/2017   GFR 45.46 (L) 04/28/2022   Lab Results  Component Value Date   CHOL 165 04/28/2022   Lab Results  Component Value Date   HDL 65.50 04/28/2022   Lab Results  Component Value Date   LDLCALC 78 04/28/2022   Lab Results  Component Value Date   TRIG 111.0 04/28/2022   Lab Results  Component Value Date   CHOLHDL 3 04/28/2022   Lab Results  Component Value Date   HGBA1C 8.8 (H) 04/28/2022      Assessment & Plan:   Problem List Items Addressed This Visit       Cardiovascular and Mediastinum   Essential hypertension    Continue lisinopril 10 mg         Respiratory   Seasonal allergic rhinitis due to pollen    Start astelin RX sent to pharmacy Start otc flonase consider pataday otc eye drops      Relevant Medications   azelastine (ASTELIN) 0.1 % nasal spray     Endocrine   Diabetes mellitus type 2 with complications (HCC)    Continue trulicity 1.5 mg [place] Qweek  Consider increase to 3 mg Qweek pending results of fructosamine Diabetic diet exercise as tolerated Continue metformin 1000 mg po bid  Make appt with opthamology for f/u on diabetic retinopathy Check feet daily for s/s infection      Relevant Orders   Fructosamine     Other   Elevated liver function tests    Repeat lft today U/s negative      Hypomagnesemia    Continue mag oxide 400 mg bid  Check mag level today pending results      Relevant Orders   Magnesium   Attention deficit hyperactivity disorder (ADHD), predominantly inattentive type    Will start trial of vyvanse  pdmp  reviewed.        Elevated creatine kinase - Primary    Repeat cmp today Increase water intake      Relevant Orders   Comprehensive metabolic panel   Wt Readings from Last 3 Encounters:  06/15/22 147 lb 2 oz (66.7 kg)  04/28/22 151 lb (68.5 kg)  02/02/17 173 lb 3.2 oz (78.6 kg)    Meds ordered this encounter  Medications   azelastine (ASTELIN) 0.1 % nasal spray    Sig: Place 1 spray into both nostrils 2 (two) times daily. Use in each nostril as directed    Dispense:  30 mL    Refill:  12    Order Specific Question:   Supervising Provider    Answer:   Ermalene Searing, AMY E [[id number]]    Follow-up: Return in about 3 months (around 09/15/2022) for regular in office follow up on diabetes .    Mort Sawyers, FNP

## 2022-06-15 NOTE — Assessment & Plan Note (Signed)
Continue mag oxide 400 mg bid  Check mag level today pending results

## 2022-06-15 NOTE — Assessment & Plan Note (Signed)
Continue lisinopril 10 mg

## 2022-06-15 NOTE — Assessment & Plan Note (Signed)
Repeat cmp today Increase water intake

## 2022-06-15 NOTE — Assessment & Plan Note (Signed)
Will start trial of vyvanse  pdmp reviewed.

## 2022-06-15 NOTE — Assessment & Plan Note (Signed)
Repeat lft today U/s negative

## 2022-06-15 NOTE — Assessment & Plan Note (Signed)
Continue trulicity 1.5 mg INJ Qweek  Consider increase to 3 mg Qweek pending results of fructosamine Diabetic diet exercise as tolerated Continue metformin 1000 mg po bid  Make appt with opthamology for f/u on diabetic retinopathy Check feet daily for s/s infection

## 2022-06-15 NOTE — Assessment & Plan Note (Signed)
Start astelin RX sent to pharmacy Start otc flonase consider pataday otc eye drops

## 2022-06-15 NOTE — Patient Instructions (Signed)
Try astelin spray this has been sent in as an RX.  Start flonase daily.  Recommend pataday eye drops.   Stop by the lab prior to leaving today. I will notify you of your results once received.    Regards,   Mort Sawyers FNP-C

## 2022-06-17 ENCOUNTER — Encounter: Payer: Self-pay | Admitting: *Deleted

## 2022-06-17 ENCOUNTER — Encounter: Payer: Self-pay | Admitting: Family

## 2022-06-17 ENCOUNTER — Other Ambulatory Visit: Payer: Self-pay | Admitting: Family

## 2022-06-17 DIAGNOSIS — E118 Type 2 diabetes mellitus with unspecified complications: Secondary | ICD-10-CM

## 2022-06-17 DIAGNOSIS — N289 Disorder of kidney and ureter, unspecified: Secondary | ICD-10-CM

## 2022-06-17 DIAGNOSIS — R7989 Other specified abnormal findings of blood chemistry: Secondary | ICD-10-CM

## 2022-06-17 NOTE — Progress Notes (Signed)
Magnesium still low. Let's continue with twice daily dosing as tolerating well.  Does she need a refill?   Kidney function stable however at 41 y/o should not consistently be less than 50. Will refer to nephrology. Also creatinine slight increased so stressed possibly. Work on drinking enough water as well. Will also order US kidneys as well for this , along with referral. Can call [facility] on [address] where she went last time to schedule, I have placed order.   Liver function still persistently elevated. We will continue to monitor.

## 2022-06-18 ENCOUNTER — Other Ambulatory Visit: Payer: Self-pay | Admitting: Family

## 2022-06-18 DIAGNOSIS — E118 Type 2 diabetes mellitus with unspecified complications: Secondary | ICD-10-CM

## 2022-06-18 DIAGNOSIS — Z5181 Encounter for therapeutic drug level monitoring: Secondary | ICD-10-CM

## 2022-06-18 LAB — FRUCTOSAMINE: Fructosamine: 358 umol/L — ABNORMAL HIGH (ref 205–285)

## 2022-06-18 MED ORDER — TRULICITY 1.5 MG/0.5ML ~~LOC~~ SOAJ: 3.0000 mg | SUBCUTANEOUS | 1 refills | Status: DC

## 2022-06-18 NOTE — Progress Notes (Signed)
Increase trulicity to 3 mg weekly from 1.5 weekly.  F/u two months, we will repeat labs.  For vyvanse, See if pt can drop off urine sample. I have UDS setup, apologize as I forgot to have her do this while at visit. Will send in Vyvanse once this is resulted. (Can be lab only)

## 2022-06-19 ENCOUNTER — Other Ambulatory Visit: Payer: 59

## 2022-06-19 DIAGNOSIS — Z5181 Encounter for therapeutic drug level monitoring: Secondary | ICD-10-CM

## 2022-06-20 LAB — DRUG MONITORING PANEL 375977 , URINE

## 2022-06-20 LAB — DM TEMPLATE

## 2022-06-23 MED ORDER — TRULICITY 3 MG/0.5ML ~~LOC~~ SOAJ: 3.0000 mg | SUBCUTANEOUS | 2 refills | Status: DC

## 2022-07-03 ENCOUNTER — Ambulatory Visit
Admission: RE | Admit: 2022-07-03 | Discharge: 2022-07-03 | Disposition: A | Discharge status: Home / Self Care | Payer: 59 | Source: Ambulatory Visit | Attending: Family | Admitting: Family

## 2022-07-03 DIAGNOSIS — N289 Disorder of kidney and ureter, unspecified: Secondary | ICD-10-CM

## 2022-07-03 DIAGNOSIS — R7989 Other specified abnormal findings of blood chemistry: Secondary | ICD-10-CM

## 2022-07-03 DIAGNOSIS — E118 Type 2 diabetes mellitus with unspecified complications: Secondary | ICD-10-CM

## 2022-07-03 DIAGNOSIS — R944 Abnormal results of kidney function studies: Secondary | ICD-10-CM | POA: Diagnosis not present

## 2022-07-05 ENCOUNTER — Other Ambulatory Visit: Payer: Self-pay | Admitting: Family

## 2022-07-27 DIAGNOSIS — I10 Essential (primary) hypertension: Secondary | ICD-10-CM | POA: Diagnosis not present

## 2022-07-27 DIAGNOSIS — E1122 Type 2 diabetes mellitus with diabetic chronic kidney disease: Secondary | ICD-10-CM | POA: Diagnosis not present

## 2022-07-27 DIAGNOSIS — N1831 Chronic kidney disease, stage 3a: Secondary | ICD-10-CM | POA: Diagnosis not present

## 2022-07-31 ENCOUNTER — Other Ambulatory Visit: Payer: Self-pay | Admitting: Family

## 2022-08-06 ENCOUNTER — Telehealth: Payer: 59 | Admitting: Family Medicine

## 2022-08-06 DIAGNOSIS — H44002 Unspecified purulent endophthalmitis, left eye: Secondary | ICD-10-CM | POA: Diagnosis not present

## 2022-08-06 MED ORDER — AMOXICILLIN-POT CLAVULANATE 875-125 MG PO TABS: 1.0000 | ORAL_TABLET | Freq: Two times a day (BID) | ORAL | 0 refills | Status: AC

## 2022-08-06 NOTE — Patient Instructions (Signed)
  Kayla Erickson, thank you for joining Perlie Mayo, NP for today's virtual visit.  While this provider is not your primary care provider (PCP), if your PCP is located in our provider database this encounter information will be shared with them immediately following your visit.   [place] account gives you access to today's visit and all your visits, tests, and labs performed at [facility] " click here if you don't have a [place] account or go to [email or web address]  Consent: (Patient) Kayla Erickson provided verbal consent for this virtual visit at the beginning of the encounter.  Current Medications:  Current Outpatient Medications:    amoxicillin-clavulanate (AUGMENTIN) 875-125 MG tablet, Take 1 tablet by mouth 2 (two) times daily for 7 days., Disp: 14 tablet, Rfl: 0   azelastine (ASTELIN) 0.1 % nasal spray, Place 1 spray into both nostrils 2 (two) times daily. Use in each nostril as directed, Disp: 30 mL, Rfl: 12   Dulaglutide (TRULICITY) 3 NI/6.2VO SOPN, Inject 3 mg as directed once a week., Disp: 2 mL, Rfl: 2   lisinopril (ZESTRIL) 10 MG tablet, Take 1 tablet (10 mg total) by mouth daily., Disp: 90 tablet, Rfl: 1   magnesium oxide (MAG-OX) 400 (240 Mg) MG tablet, TAKE 1 TABLET BY MOUTH TWICE A DAY, Disp: 180 tablet, Rfl: 1   metFORMIN (GLUCOPHAGE) 1000 MG tablet, Take 1 tablet (1,000 mg total) by mouth 2 (two) times daily with a meal., Disp: 180 tablet, Rfl: 1   Medications ordered in this encounter:  Meds ordered this encounter  Medications   amoxicillin-clavulanate (AUGMENTIN) 875-125 MG tablet    Sig: Take 1 tablet by mouth 2 (two) times daily for 7 days.    Dispense:  14 tablet    Refill:  0    Order Specific Question:   Supervising Provider    Answer:   Chase Picket [id number]     *If you need refills on other medications prior to your next appointment, please contact your pharmacy*  Follow-Up: Call back or seek an in-person  evaluation if the symptoms worsen or if the condition fails to improve as anticipated.  [place] [phone]  Other Instructions -keep area clean, avoid rubbing  If vision changes, pus drainage, eye pain or swelling worsen be seen in ED ASAP   If you have been instructed to have an in-person evaluation today at a local Urgent Care facility, please use the link below. It will take you to a list of all of our available [facility], including address, phone number and hours of operation. Please do not delay care.  [facility]  If you or a family member do not have a primary care provider, use the link below to schedule a visit and establish care. When you choose a [place] primary care physician or advanced practice provider, you gain a long-term partner in health. Find a Primary Care Provider  Learn more about [facility]'s in-office and virtual care options: [facility]

## 2022-08-06 NOTE — Progress Notes (Signed)
Virtual Visit Consent   Kayla Erickson, you are scheduled for a virtual visit with a [place] provider today. Just as with appointments in the office, your consent must be obtained to participate. Your consent will be active for this visit and any virtual visit you may have with one of our providers in the next 365 days. If you have a MyChart account, a copy of this consent can be sent to you electronically.  As this is a virtual visit, video technology does not allow for your provider to perform a traditional examination. This may limit your provider's ability to fully assess your condition. If your provider identifies any concerns that need to be evaluated in person or the need to arrange testing (such as labs, EKG, etc.), we will make arrangements to do so. Although advances in technology are sophisticated, we cannot ensure that it will always work on either your end or our end. If the connection with a video visit is poor, the visit may have to be switched to a telephone visit. With either a video or telephone visit, we are not always able to ensure that we have a secure connection.  By engaging in this virtual visit, you consent to the provision of healthcare and authorize for your insurance to be billed (if applicable) for the services provided during this visit. Depending on your insurance coverage, you may receive a charge related to this service.  I need to obtain your verbal consent now. Are you willing to proceed with your visit today? Kayla Erickson has provided verbal consent on 08/06/2022 for a virtual visit (video or telephone). Freddy Finner, NP  Date: 08/06/2022 12:02 PM  Virtual Visit via Video Note   I, Freddy Finner, connected with  Kayla Erickson  ([id number], 23-Jan-1981) on 08/06/22 at 12:15 PM EDT by a video-enabled telemedicine application and verified that I am speaking with the correct person using two identifiers.  Location: Patient: Virtual Visit Location Patient:  Home Provider: Virtual Visit Location Provider: Home Office   I discussed the limitations of evaluation and management by telemedicine and the availability of in person appointments. The patient expressed understanding and agreed to proceed.    History of Present Illness: Kayla Erickson is a 41 y.o. who identifies as a female who was assigned female at birth, and is being seen today for eye swelling- left side. Has had recent URI /allergy like symptoms- in last day eye started to swell and is now tender. No other signs or symptoms at this time for sinus or URI.  Denies fever, chills, itching, eye drainage, vision changes, or eye pain with movement.   Problems:  Patient Active Problem List   Diagnosis Date Noted   Seasonal allergic rhinitis due to pollen 06/15/2022   Elevated creatine kinase 06/15/2022   Elevated liver function tests 04/28/2022   Hypomagnesemia 04/28/2022   Attention deficit hyperactivity disorder (ADHD), predominantly inattentive type 04/28/2022   Diabetic retinopathy (HCC) 03/28/2013   Diabetes mellitus type 2 with complications [facility])    Essential hypertension 03/25/2010    Allergies: No Known Allergies Medications:  Current Outpatient Medications:    azelastine (ASTELIN) 0.1 % nasal spray, Place 1 spray into both nostrils 2 (two) times daily. Use in each nostril as directed, Disp: 30 mL, Rfl: 12   Dulaglutide (TRULICITY) 3 MG/0.5ML SOPN, Inject 3 mg as directed once a week., Disp: 2 mL, Rfl: 2   lisinopril (ZESTRIL) 10 MG tablet, Take 1 tablet (10 mg total) by mouth daily.,  Disp: 90 tablet, Rfl: 1   magnesium oxide (MAG-OX) 400 (240 Mg) MG tablet, TAKE 1 TABLET BY MOUTH TWICE A DAY, Disp: 180 tablet, Rfl: 1   metFORMIN (GLUCOPHAGE) 1000 MG tablet, Take 1 tablet (1,000 mg total) by mouth 2 (two) times daily with a meal., Disp: 180 tablet, Rfl: 1  Observations/Objective: Patient is well-developed, well-nourished in no acute distress.  Resting comfortably  at home.   Head is normocephalic, atraumatic.  No labored breathing.  Speech is clear and coherent with logical content.  Patient is alert and oriented at baseline.  Left lower eye lid redness Upper Cheek zygomatic area - swelling (tender to touch- pt asked to palpate the area on Video)  Assessment and Plan:   1. Infection of left eye  - amoxicillin-clavulanate (AUGMENTIN) 875-125 MG tablet; Take 1 tablet by mouth 2 (two) times daily for 7 days.  Dispense: 14 tablet; Refill: 0   -DDx Periorbital cellulitis -Given lack of drainage and crusting or gritty feeling- we will treat for questionable cellulitis to prevent further risk -in person precautions reviewed -keep area clean, avoid rubbing   Reviewed side effects, risks and benefits of medication.    Patient acknowledged agreement and understanding of the plan.   Past Medical, Surgical, Social History, Allergies, and Medications have been Reviewed.     Follow Up Instructions: I discussed the assessment and treatment plan with the patient. The patient was provided an opportunity to ask questions and all were answered. The patient agreed with the plan and demonstrated an understanding of the instructions.  A copy of instructions were sent to the patient via MyChart unless otherwise noted below.     The patient was advised to call back or seek an in-person evaluation if the symptoms worsen or if the condition fails to improve as anticipated.  Time:  I spent 10 minutes with the patient via telehealth technology discussing the above problems/concerns.    Perlie Mayo, NP

## 2022-08-18 ENCOUNTER — Ambulatory Visit (INDEPENDENT_AMBULATORY_CARE_PROVIDER_SITE_OTHER): Payer: 59 | Admitting: Family

## 2022-08-18 ENCOUNTER — Encounter: Payer: Self-pay | Admitting: Family

## 2022-08-18 VITALS — BP 116/70 | HR 90 | Temp 98.2°F | Resp 16 | Ht 63.75 in | Wt 141.2 lb

## 2022-08-18 DIAGNOSIS — F900 Attention-deficit hyperactivity disorder, predominantly inattentive type: Secondary | ICD-10-CM | POA: Diagnosis not present

## 2022-08-18 DIAGNOSIS — Z23 Encounter for immunization: Secondary | ICD-10-CM | POA: Diagnosis not present

## 2022-08-18 DIAGNOSIS — E118 Type 2 diabetes mellitus with unspecified complications: Secondary | ICD-10-CM | POA: Diagnosis not present

## 2022-08-18 LAB — POCT GLYCOSYLATED HEMOGLOBIN (HGB A1C): Hemoglobin A1C: 6.5 % — AB (ref 4.0–5.6)

## 2022-08-18 LAB — MAGNESIUM: Magnesium: 1.7 mg/dL (ref 1.5–2.5)

## 2022-08-18 MED ORDER — LISDEXAMFETAMINE DIMESYLATE 30 MG PO CAPS: 30.0000 mg | ORAL_CAPSULE | Freq: Every day | ORAL | 0 refills | Status: DC

## 2022-08-18 NOTE — Progress Notes (Signed)
Established Patient Office Visit  Subjective:  Patient ID: Kayla Erickson, female    DOB: 09/16/1981  Age: 41 y.o. MRN: [id number]  CC:  Chief Complaint  Patient presents with   Diabetes    HPI [name] is here today for follow up.   Pt is with acute concerns.  DM2: trulicity 3 mg weekly as well as farxiga 10 mg which was just started a nephrology. Visit with Dr. Cherylann Ratel was last 10/9. She has a f/u in four weeks with him as well.   Hypomagnesium: still taking twice daily mag 400 mg.   ADHD: Hard to focus at times and stay on task has tried medication in the past.  Would like to retry Vyvanse.  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)    SINUS TACHYCARDIA 11/24/2007   Qualifier: Diagnosis of  By: Gwenlyn Perking MD, Carlos     Urine test positive for microalbuminuria 04/28/2022    Past Surgical History:  Procedure Laterality Date   WISDOM TOOTH EXTRACTION      Family History  Problem Relation Age of Onset   Diabetes Paternal Grandmother     Social History   Socioeconomic History   Marital status: Significant Other    Spouse name: Not on file   Number of children: 1   Years of education: 16   Highest education level: Not on file  Occupational History   Not on file  Tobacco Use   Smoking status: Never   Smokeless tobacco: Never  Vaping Use   Vaping Use: Never used  Substance and Sexual Activity   Alcohol use: Yes    Alcohol/week: 2.0 standard drinks of alcohol    Types: 2 Cans of beer per week    Comment: socially   Drug use: No   Sexual activity: Yes    Partners: Female    Birth control/protection: Other-see comments    Comment: same sex  Other Topics Concern   Not on file  Social History  Narrative   Single, Never smoked, but with passive smoke exposure (her mom is a smoker). Traveled to [place] 3 years ago.  No tattoos, No IVDA, No dietary supplements, No blood transfusion.       1 y/o child , did not birth    Sig other did    Social Determinants of Corporate investment banker Strain: Not on file  Food Insecurity: Not on file  Transportation Needs: Not on file  Physical Activity: Not on file  Stress: Not on file  Social Connections: Not on file  Intimate Partner Violence: Not on file    Outpatient Medications Prior to Visit  Medication Sig Dispense Refill   azelastine (ASTELIN) 0.1 % nasal spray Place 1 spray into both nostrils 2 (two) times daily. Use in each nostril as directed 30 mL 12   dapagliflozin propanediol (FARXIGA) 10 MG TABS tablet Take by mouth.     Dulaglutide (TRULICITY) 3 MG/0.5ML SOPN Inject 3 mg as directed once a week. 2 mL 2   lisinopril (ZESTRIL) 10 MG tablet Take 1 tablet (10 mg total) by mouth daily. 90 tablet 1   magnesium oxide (MAG-OX) 400 (240 Mg) MG tablet TAKE 1 TABLET BY MOUTH TWICE A DAY 180  tablet 1   metFORMIN (GLUCOPHAGE) 1000 MG tablet Take 1 tablet (1,000 mg total) by mouth 2 (two) times daily with a meal. 180 tablet 1   No facility-administered medications prior to visit.    No Known Allergies       Objective:    Physical Exam Constitutional:      General: She is not in acute distress.    Appearance: Normal appearance. She is normal weight. She is not ill-appearing, toxic-appearing or diaphoretic.  Cardiovascular:     Rate and Rhythm: Normal rate and regular rhythm.  Pulmonary:     Effort: Pulmonary effort is normal.     Breath sounds: Normal breath sounds.  Neurological:     General: No focal deficit present.     Mental Status: She is alert and oriented to person, place, and time. Mental status is at baseline.  Psychiatric:        Mood and Affect: Mood normal.        Behavior: Behavior normal.        Thought  Content: Thought content normal.        Judgment: Judgment normal.      BP 116/70   Pulse 90   Temp 98.2 F (36.8 C)   Resp 16   Ht 5' 3.75" (1.619 m)   Wt 141 lb 4 oz (64.1 kg)   LMP 08/04/2022   SpO2 99%   BMI 24.44 kg/m  Wt Readings from Last 3 Encounters:  08/18/22 141 lb 4 oz (64.1 kg)  06/15/22 147 lb 2 oz (66.7 kg)  04/28/22 151 lb (68.5 kg)     Health Maintenance Due  Topic Date Due   PAP SMEAR-Modifier  Never done   FOOT EXAM  12/27/2012   OPHTHALMOLOGY EXAM  01/12/2014   COVID-19 Vaccine (3 - Pfizer series) 02/19/2020   TETANUS/TDAP  12/27/2021    There are no preventive care reminders to display for this patient.  Lab Results  Component Value Date   TSH 0.68 04/28/2022   Lab Results  Component Value Date   WBC 5.7 04/28/2022   HGB 13.5 04/28/2022   HCT 38.6 04/28/2022   MCV 92.6 04/28/2022   PLT 231.0 04/28/2022   Lab Results  Component Value Date   NA 135 06/15/2022   K 4.4 06/15/2022   CO2 31 06/15/2022   GLUCOSE 195 (H) 06/15/2022   BUN 27 (H) 06/15/2022   CREATININE 1.36 (H) 06/15/2022   BILITOT 0.5 06/15/2022   ALKPHOS 74 06/15/2022   AST 40 (H) 06/15/2022   ALT 61 (H) 06/15/2022   PROT 7.1 06/15/2022   ALBUMIN 4.6 06/15/2022   CALCIUM 10.5 06/15/2022   ANIONGAP 9 01/19/2017   GFR 49.04 (L) 06/15/2022   Lab Results  Component Value Date   CHOL 165 04/28/2022   Lab Results  Component Value Date   HDL 65.50 04/28/2022   Lab Results  Component Value Date   LDLCALC 78 04/28/2022   Lab Results  Component Value Date   TRIG 111.0 04/28/2022   Lab Results  Component Value Date   CHOLHDL 3 04/28/2022   Lab Results  Component Value Date   HGBA1C 6.5 (A) 08/18/2022      Assessment & Plan:   Problem List Items Addressed This Visit       Endocrine   Diabetes mellitus type 2 with complications (HCC) - Primary    hga1c today with improvement.  Advised to continue with diabetic diet, exercise as tolerated. Continue  trulicity 3 mg qweek, continue farxiga and continue metformin 1000 mg.   Flu vaccine given today in office      Relevant Medications   dapagliflozin propanediol (FARXIGA) 10 MG TABS tablet   lisdexamfetamine (VYVANSE) 30 MG capsule   Other Relevant Orders   POCT glycosylated hemoglobin (Hb A1C) (Completed)     Other   Hypomagnesemia    Ordering magnesium level today pending results. . For now continue mag 400 mg twice daily.      Relevant Orders   Magnesium   Attention deficit hyperactivity disorder (ADHD), predominantly inattentive type    PDMP reviewed Vyvanse 30 mg sent to pharmacy      Other Visit Diagnoses     Need for influenza vaccination       Relevant Medications   lisdexamfetamine (VYVANSE) 30 MG capsule   Other Relevant Orders   Flu Vaccine QUAD 66mo+IM (Fluarix, Fluzone & Alfiuria Quad PF) (Completed)       Meds ordered this encounter  Medications   lisdexamfetamine (VYVANSE) 30 MG capsule    Sig: Take 1 capsule (30 mg total) by mouth daily.    Dispense:  30 capsule    Refill:  0    Order Specific Question:   Supervising Provider    Answer:   Diona Browner, AMY E [[id number]    Follow-up: Return in about 3 months (around 11/18/2022) for f/u diabetes.    Eugenia Pancoast, FNP

## 2022-08-18 NOTE — Assessment & Plan Note (Signed)
PDMP reviewed Vyvanse 30 mg sent to pharmacy

## 2022-08-18 NOTE — Assessment & Plan Note (Signed)
Ordering magnesium level today pending results. . For now continue mag 400 mg twice daily.

## 2022-08-18 NOTE — Patient Instructions (Signed)
  Regards,   Jeovanni Heuring FNP-C  

## 2022-08-18 NOTE — Assessment & Plan Note (Addendum)
hga1c today with improvement.  Advised to continue with diabetic diet, exercise as tolerated. Continue trulicity 3 mg qweek, continue farxiga and continue metformin 1000 mg.   Flu vaccine given today in office

## 2022-08-19 MED ORDER — MAGNESIUM OXIDE -MG SUPPLEMENT 400 (240 MG) MG PO TABS: 1.0000 | ORAL_TABLET | Freq: Every day | ORAL | 1 refills | Status: DC

## 2022-08-19 NOTE — Addendum Note (Signed)
Addended by: Eugenia Pancoast on: 08/19/2022 01:37 PM   Modules accepted: Orders

## 2022-08-24 DIAGNOSIS — I10 Essential (primary) hypertension: Secondary | ICD-10-CM | POA: Diagnosis not present

## 2022-08-24 DIAGNOSIS — N1831 Chronic kidney disease, stage 3a: Secondary | ICD-10-CM | POA: Diagnosis not present

## 2022-08-24 DIAGNOSIS — E1122 Type 2 diabetes mellitus with diabetic chronic kidney disease: Secondary | ICD-10-CM | POA: Diagnosis not present

## 2022-09-20 ENCOUNTER — Other Ambulatory Visit: Payer: Self-pay | Admitting: Family

## 2022-09-20 DIAGNOSIS — I10 Essential (primary) hypertension: Secondary | ICD-10-CM

## 2022-09-21 ENCOUNTER — Encounter: Payer: Self-pay | Admitting: Family

## 2022-09-21 DIAGNOSIS — F900 Attention-deficit hyperactivity disorder, predominantly inattentive type: Secondary | ICD-10-CM

## 2022-09-21 DIAGNOSIS — E118 Type 2 diabetes mellitus with unspecified complications: Secondary | ICD-10-CM

## 2022-09-22 MED ORDER — TRULICITY 3 MG/0.5ML ~~LOC~~ SOAJ: 3.0000 mg | SUBCUTANEOUS | 2 refills | Status: DC

## 2022-09-22 MED ORDER — AMPHETAMINE-DEXTROAMPHETAMINE 20 MG PO TABS: 20.0000 mg | ORAL_TABLET | Freq: Every day | ORAL | 0 refills | Status: DC

## 2022-09-22 NOTE — Addendum Note (Signed)
Addended by: Mort Sawyers on: 09/22/2022 08:53 PM   Modules accepted: Orders

## 2022-10-30 ENCOUNTER — Encounter: Payer: Self-pay | Admitting: Family

## 2022-10-30 DIAGNOSIS — E118 Type 2 diabetes mellitus with unspecified complications: Secondary | ICD-10-CM

## 2022-10-30 MED ORDER — SEMAGLUTIDE (1 MG/DOSE) 4 MG/3ML ~~LOC~~ SOPN: 1.0000 mg | PEN_INJECTOR | SUBCUTANEOUS | 2 refills | Status: DC

## 2022-10-31 ENCOUNTER — Other Ambulatory Visit: Payer: Self-pay | Admitting: Family

## 2022-10-31 DIAGNOSIS — E118 Type 2 diabetes mellitus with unspecified complications: Secondary | ICD-10-CM

## 2022-11-02 ENCOUNTER — Other Ambulatory Visit: Payer: Self-pay | Admitting: Family

## 2022-11-02 DIAGNOSIS — F900 Attention-deficit hyperactivity disorder, predominantly inattentive type: Secondary | ICD-10-CM

## 2022-11-03 MED ORDER — AMPHETAMINE-DEXTROAMPHETAMINE 20 MG PO TABS
20.0000 mg | ORAL_TABLET | Freq: Every day | ORAL | 0 refills | Status: DC
Start: 2022-11-03 — End: 2023-02-04

## 2022-11-03 NOTE — Telephone Encounter (Signed)
I am sorry I forgot who deals with authorizations now.   Can we please see what is taking ozempic so long? Unfortunately her trulicity is on backorder so we had to change.

## 2022-11-03 NOTE — Telephone Encounter (Signed)
Have you all received prior authorization for Ozempic?

## 2022-11-03 NOTE — Telephone Encounter (Signed)
Looks like it was a approved and filled.

## 2022-11-16 ENCOUNTER — Other Ambulatory Visit: Payer: Self-pay | Admitting: Family

## 2022-11-16 DIAGNOSIS — E118 Type 2 diabetes mellitus with unspecified complications: Secondary | ICD-10-CM

## 2022-11-17 NOTE — Telephone Encounter (Signed)
Can we look into if a prior auth is needed for her trulicity refill?  She tried to get her normal ozempic last visit and it was out of stock on backorder so we had to switch to truclity now I have a pharmacy note saying not covered.

## 2022-11-17 NOTE — Telephone Encounter (Signed)
PA has been submitted in covermymeds. (Key: [id number]) pending approval/denial.

## 2022-11-19 MED ORDER — TRULICITY 3 MG/0.5ML ~~LOC~~ SOAJ: 3.0000 mg | SUBCUTANEOUS | 2 refills | Status: DC

## 2022-11-19 NOTE — Addendum Note (Signed)
Addended by: Eugenia Pancoast on: 11/19/2022 10:52 AM   Modules accepted: Orders

## 2022-12-01 ENCOUNTER — Other Ambulatory Visit: Payer: Self-pay | Admitting: Family

## 2022-12-28 DIAGNOSIS — E1122 Type 2 diabetes mellitus with diabetic chronic kidney disease: Secondary | ICD-10-CM | POA: Diagnosis not present

## 2022-12-28 DIAGNOSIS — N1832 Chronic kidney disease, stage 3b: Secondary | ICD-10-CM | POA: Diagnosis not present

## 2022-12-28 DIAGNOSIS — I10 Essential (primary) hypertension: Secondary | ICD-10-CM | POA: Diagnosis not present

## 2023-02-04 ENCOUNTER — Ambulatory Visit: Payer: 59 | Admitting: Family

## 2023-02-04 ENCOUNTER — Ambulatory Visit (INDEPENDENT_AMBULATORY_CARE_PROVIDER_SITE_OTHER)
Admission: RE | Admit: 2023-02-04 | Discharge: 2023-02-04 | Disposition: A | Discharge status: Home / Self Care | Payer: 59 | Source: Ambulatory Visit | Attending: Family | Admitting: Family

## 2023-02-04 ENCOUNTER — Encounter: Payer: Self-pay | Admitting: Family

## 2023-02-04 VITALS — BP 118/74 | HR 86 | Temp 97.8°F | Ht 63.75 in | Wt 127.6 lb

## 2023-02-04 DIAGNOSIS — F900 Attention-deficit hyperactivity disorder, predominantly inattentive type: Secondary | ICD-10-CM

## 2023-02-04 DIAGNOSIS — I10 Essential (primary) hypertension: Secondary | ICD-10-CM

## 2023-02-04 DIAGNOSIS — M25511 Pain in right shoulder: Secondary | ICD-10-CM | POA: Diagnosis not present

## 2023-02-04 DIAGNOSIS — R7989 Other specified abnormal findings of blood chemistry: Secondary | ICD-10-CM

## 2023-02-04 DIAGNOSIS — E118 Type 2 diabetes mellitus with unspecified complications: Secondary | ICD-10-CM | POA: Diagnosis not present

## 2023-02-04 LAB — MAGNESIUM: Magnesium: 1.4 mg/dL — ABNORMAL LOW (ref 1.5–2.5)

## 2023-02-04 LAB — MICROALBUMIN / CREATININE URINE RATIO
Creatinine,U: 64.6 mg/dL
Microalb Creat Ratio: 1.1 mg/g (ref 0.0–30.0)
Microalb, Ur: <0.7 mg/dL (ref 0.0–1.9)

## 2023-02-04 LAB — HEMOGLOBIN A1C: Hgb A1c MFr Bld: 6.6 % — ABNORMAL HIGH (ref 4.6–6.5)

## 2023-02-04 LAB — HEPATIC FUNCTION PANEL
ALT: 64 U/L — ABNORMAL HIGH (ref 0–35)
AST: 42 U/L — ABNORMAL HIGH (ref 0–37)
Albumin: 4.4 g/dL (ref 3.5–5.2)
Alkaline Phosphatase: 114 U/L (ref 39–117)
Bilirubin, Direct: 0.1 mg/dL (ref 0.0–0.3)
Total Bilirubin: 0.5 mg/dL (ref 0.2–1.2)
Total Protein: 7.1 g/dL (ref 6.0–8.3)

## 2023-02-04 MED ORDER — ATOMOXETINE HCL 40 MG PO CAPS
40.0000 mg | ORAL_CAPSULE | Freq: Every day | ORAL | 0 refills | Status: DC
Start: 2023-02-04 — End: 2023-03-03

## 2023-02-04 NOTE — Assessment & Plan Note (Signed)
Referral to orthopedics suspect rotator cuff sprain vs tear Handout given to pt exercises discussed Xray today to r/o causes  Lidocaine patch, tylenol prn heat ice prn

## 2023-02-04 NOTE — Assessment & Plan Note (Signed)
Trial start strattera 30 mg once daily

## 2023-02-04 NOTE — Assessment & Plan Note (Signed)
Repeat hepatic function panel pending results

## 2023-02-04 NOTE — Patient Instructions (Signed)
  A referral was placed today for orthopedics Please let us know if you have not heard back within 2 weeks about the referral.  Stop by the lab prior to leaving today. I will notify you of your results once received.    Regards,   Mort Sawyers FNP-C

## 2023-02-04 NOTE — Progress Notes (Signed)
Established Patient Office Visit  Subjective:      CC:  Chief Complaint  Patient presents with   Medical Management of Chronic Issues    HPI: Kayla Erickson is a 42 y.o. female presenting on 02/04/2023 for Medical Management of Chronic Issues . ADHD: stopped the adderall as it was decreasing her appetite. She is not able to focus now that she has stopped the medication, leaving cabinets open and forgetting things often as she gets distracted.   Dm2: on trulicity 3 mg weekly, jardiance 10 mg once daily as well as metformin 1000 mg bid  Lab Results  Component Value Date   HGBA1C 6.5 (A) 08/18/2022    Abn weight loss: however on Trulicity which is likely cause. Pt states has stabilized in the last two months. She is having more of an appetite since d/c the adderall.  Wt Readings from Last 3 Encounters:  02/04/23 127 lb 9.6 oz (57.9 kg)  08/18/22 141 lb 4 oz (64.1 kg)  06/15/22 147 lb 2 oz (66.7 kg)    Right shoulder pain, unable to lift past her shoulder without pain. She is a Production assistant, radio at work, she states sometimes ok other times it is not. Has taken tylenol but only provides mild relief on an intermittent basis.   Elevated lfts: no longer drinking etoh. U/s abdomen 05/13/2022 normal limits        Social history:  Relevant past medical, surgical, family and social history reviewed and updated as indicated. Interim medical history since our last visit reviewed.  Allergies and medications reviewed and updated.  DATA REVIEWED: CHART IN EPIC     ROS: Negative unless specifically indicated above in HPI.    Current Outpatient Medications:    atomoxetine (STRATTERA) 40 MG capsule, Take 1 capsule (40 mg total) by mouth daily., Disp: 30 capsule, Rfl: 0   azelastine (ASTELIN) 0.1 % nasal spray, Place 1 spray into both nostrils 2 (two) times daily. Use in each nostril as directed, Disp: 30 mL, Rfl: 12   Dulaglutide (TRULICITY) 3 MG/0.5ML SOPN, Inject 3 mg as directed once a  week., Disp: 6 mL, Rfl: 2   empagliflozin (JARDIANCE) 10 MG TABS tablet, Take by mouth daily., Disp: , Rfl:    lisinopril (ZESTRIL) 10 MG tablet, TAKE 1 TABLET BY MOUTH EVERY DAY, Disp: 90 tablet, Rfl: 2   magnesium oxide (MAG-OX) 400 (240 Mg) MG tablet, Take 1 tablet (400 mg total) by mouth daily., Disp: 180 tablet, Rfl: 1   metFORMIN (GLUCOPHAGE) 1000 MG tablet, TAKE 1 TABLET (1,000 MG TOTAL) BY MOUTH TWICE A DAY WITH FOOD, Disp: 180 tablet, Rfl: 2      Objective:    BP 118/74 (BP Location: Left Arm)   Pulse 86   Temp 97.8 F (36.6 C) (Temporal)   Ht 5' 3.75" (1.619 m)   Wt 127 lb 9.6 oz (57.9 kg)   LMP 01/18/2023 (Approximate)   SpO2 98%   BMI 22.07 kg/m   Wt Readings from Last 3 Encounters:  02/04/23 127 lb 9.6 oz (57.9 kg)  08/18/22 141 lb 4 oz (64.1 kg)  06/15/22 147 lb 2 oz (66.7 kg)    Physical Exam Constitutional:      General: She is not in acute distress.    Appearance: Normal appearance. She is normal weight. She is not ill-appearing, toxic-appearing or diaphoretic.  HENT:     Head: Normocephalic.  Cardiovascular:     Rate and Rhythm: Normal rate and regular rhythm.  Pulmonary:  Effort: Pulmonary effort is normal.  Musculoskeletal:     Right shoulder: Bony tenderness (at [facility] joint and scapular area on inferior aspect) present. No swelling or effusion. Tenderness: only able to raise to 90 degrees.Decreased range of motion.     Comments: External rotation with impingement pain  Drop can test mildly positive Apley scratch test moderately positive  Grip and strength 5+ bil  Neurological:     General: No focal deficit present.     Mental Status: She is alert and oriented to person, place, and time. Mental status is at baseline.  Psychiatric:        Mood and Affect: Mood normal.        Behavior: Behavior normal.        Thought Content: Thought content normal.        Judgment: Judgment normal.     Diabetic Foot Form - Detailed   Diabetic Foot Exam -  detailed Can the patient see the bottom of their feet?: Yes Are the shoes appropriate in style and fit?: Yes Is there swelling or and abnormal foot shape?: No Is there a claw toe deformity?: No Is there elevated skin temparature?: No Is there foot or ankle muscle weakness?: No Normal Range of Motion: Yes Right posterior Tibialias: Present Left posterior Tibialias: Present   Right Dorsalis Pedis: Present Left Dorsalis Pedis: Present  Sensory Foot Exam Completed.: Yes Semmes-Weinstein Monofilament Test R Site 1-Great Toe: Pos L Site 1-Great Toe: Pos                Assessment & Plan:  Attention deficit hyperactivity disorder (ADHD), predominantly inattentive type Assessment & Plan: Trial start strattera 30 mg once daily   Orders: -     Atomoxetine HCl; Take 1 capsule (40 mg total) by mouth daily.  Dispense: 30 capsule; Refill: 0  Diabetes mellitus type 2 with complications [facility]) Assessment & Plan: Foot exam in office today  Ordering microalbumin and hga1c pending results.  Continue trulicity 3 mg weekly, metformin 500 mg bid as well as jardiance    Orders: -     Hemoglobin A1c -     Microalbumin / creatinine urine ratio  Hypomagnesemia Assessment & Plan: Stable last time however recheck  Continue mag oxide   Orders: -     Magnesium  Elevated liver function tests Assessment & Plan: Repeat hepatic function panel pending results  Orders: -     Hepatic function panel  Essential hypertension Assessment & Plan: Stable continue lisinopril    Acute pain of right shoulder Assessment & Plan: Referral to orthopedics suspect rotator cuff sprain vs tear Handout given to pt exercises discussed Xray today to r/o causes  Lidocaine patch, tylenol prn heat ice prn   Orders: -     DG Shoulder Right; Future     Return in about 6 months (around 08/06/2023) for f/u diabetes.  Mort Sawyers, MSN, APRN, FNP-C [facility]

## 2023-02-04 NOTE — Assessment & Plan Note (Signed)
Foot exam in office today  Ordering microalbumin and hga1c pending results.  Continue trulicity 3 mg weekly, metformin 500 mg bid as well as jardiance

## 2023-02-04 NOTE — Assessment & Plan Note (Signed)
Stable continue lisinopril  

## 2023-02-04 NOTE — Assessment & Plan Note (Signed)
Stable last time however recheck  Continue mag oxide

## 2023-02-05 ENCOUNTER — Other Ambulatory Visit: Payer: Self-pay | Admitting: Family

## 2023-02-05 DIAGNOSIS — E118 Type 2 diabetes mellitus with unspecified complications: Secondary | ICD-10-CM

## 2023-02-05 MED ORDER — EMPAGLIFLOZIN 25 MG PO TABS
25.0000 mg | ORAL_TABLET | Freq: Every day | ORAL | 3 refills | Status: DC
Start: 2023-02-05 — End: 2023-03-16

## 2023-02-05 MED ORDER — TRULICITY 3 MG/0.5ML ~~LOC~~ SOAJ
3.0000 mg | SUBCUTANEOUS | 2 refills | Status: DC
Start: 2023-02-05 — End: 2023-03-16

## 2023-02-09 ENCOUNTER — Encounter: Payer: Self-pay | Admitting: Family

## 2023-02-09 DIAGNOSIS — M25511 Pain in right shoulder: Secondary | ICD-10-CM

## 2023-02-11 NOTE — Telephone Encounter (Signed)
Pt has not heard from anyone regarding the ortho referral. Can we place referral again, as I couldn't locate the original.

## 2023-02-11 NOTE — Telephone Encounter (Signed)
Please see patients request

## 2023-02-17 MED ORDER — ACETAMINOPHEN-CODEINE 300-30 MG PO TABS
1.0000 | ORAL_TABLET | ORAL | 0 refills | Status: AC | PRN
Start: 2023-02-17 — End: 2023-02-22

## 2023-02-23 ENCOUNTER — Encounter: Payer: Self-pay | Admitting: Family

## 2023-02-26 NOTE — Telephone Encounter (Signed)
My chart sent to patient to let know approved  ? ?

## 2023-03-03 ENCOUNTER — Other Ambulatory Visit: Payer: Self-pay | Admitting: Family

## 2023-03-03 DIAGNOSIS — F900 Attention-deficit hyperactivity disorder, predominantly inattentive type: Secondary | ICD-10-CM

## 2023-03-03 NOTE — Telephone Encounter (Signed)
90 day with 1 refill request for atomoxetine (STRATTERA) 40 MG capsule instead of 30 day LR- 02/04/23 ( 30 caps/ no refills) LV- 02/04/23

## 2023-03-13 ENCOUNTER — Encounter: Payer: Self-pay | Admitting: Family

## 2023-03-13 DIAGNOSIS — E118 Type 2 diabetes mellitus with unspecified complications: Secondary | ICD-10-CM

## 2023-03-16 ENCOUNTER — Other Ambulatory Visit: Payer: Self-pay | Admitting: Family

## 2023-03-16 DIAGNOSIS — E118 Type 2 diabetes mellitus with unspecified complications: Secondary | ICD-10-CM

## 2023-03-16 MED ORDER — EMPAGLIFLOZIN 25 MG PO TABS: 25.0000 mg | ORAL_TABLET | Freq: Every day | ORAL | 3 refills | Status: DC

## 2023-03-16 MED ORDER — SEMAGLUTIDE (1 MG/DOSE) 4 MG/3ML ~~LOC~~ SOPN
1.0000 mg | PEN_INJECTOR | SUBCUTANEOUS | 1 refills | Status: DC
Start: 2023-03-16 — End: 2023-03-22

## 2023-03-16 NOTE — Addendum Note (Signed)
Addended by: Mort Sawyers on: 03/16/2023 01:15 PM   Modules accepted: Orders

## 2023-03-16 NOTE — Telephone Encounter (Signed)
Pt needs ozempic p/a Has had this before as her trulicity was unavailable at pharmacy. Needing ozempic again due to same reason.

## 2023-03-17 ENCOUNTER — Telehealth: Payer: Self-pay

## 2023-03-17 ENCOUNTER — Other Ambulatory Visit: Payer: Self-pay | Admitting: Family

## 2023-03-17 ENCOUNTER — Other Ambulatory Visit (HOSPITAL_COMMUNITY): Payer: Self-pay

## 2023-03-17 DIAGNOSIS — E118 Type 2 diabetes mellitus with unspecified complications: Secondary | ICD-10-CM

## 2023-03-17 NOTE — Telephone Encounter (Signed)
Patient Advocate Encounter   Received notification from Pt Advice Requests that prior authorization for Ozempic is required.   PA submitted on 03/17/2023 Key [id number] Insurance [employer] Tenna Child) Status is pending

## 2023-03-17 NOTE — Telephone Encounter (Signed)
Submitted PA, currently pending. Will document and update in separate encounter.

## 2023-03-17 NOTE — Telephone Encounter (Signed)
Per pharmacy, this medication (trulicity) is on back-order. Any suggested alternatives for this medication?

## 2023-03-19 ENCOUNTER — Telehealth: Payer: Self-pay

## 2023-03-19 ENCOUNTER — Other Ambulatory Visit (HOSPITAL_COMMUNITY): Payer: Self-pay

## 2023-03-19 NOTE — Telephone Encounter (Signed)
Received fax from [place] stating PA is not required for Jardiance. Ran test claim, came back refill too soon. Prescription was filled 03/17/23 at [facility].

## 2023-03-19 NOTE — Telephone Encounter (Signed)
Per pharmacy Jardiance requires PA.  PA started via [facility] Key: [id number]

## 2023-03-19 NOTE — Telephone Encounter (Signed)
Pharmacy is asking for an alternatve be sent in for Semaglutide, 1 MG/DOSE, 4 MG/3ML SOPN

## 2023-03-22 ENCOUNTER — Other Ambulatory Visit: Payer: Self-pay | Admitting: Family

## 2023-03-22 DIAGNOSIS — E118 Type 2 diabetes mellitus with unspecified complications: Secondary | ICD-10-CM

## 2023-03-22 MED ORDER — TIRZEPATIDE 5 MG/0.5ML ~~LOC~~ SOAJ
5.0000 mg | SUBCUTANEOUS | 1 refills | Status: DC
Start: 2023-03-22 — End: 2023-03-22

## 2023-03-22 MED ORDER — VICTOZA 18 MG/3ML ~~LOC~~ SOPN
PEN_INJECTOR | SUBCUTANEOUS | 2 refills | Status: DC
Start: 2023-03-22 — End: 2023-05-04

## 2023-03-22 NOTE — Telephone Encounter (Signed)
Pharmacy Patient Advocate Encounter  Received notification from [place] ([facility]) that the request for prior authorization for Ozempic has been denied due to .    Please be advised we currently do not have a Pharmacist to review denials, therefore you will need to process appeals accordingly as needed. Thanks for your support at this time.   You may call [phone] or fax [phone], to appeal.

## 2023-03-22 NOTE — Addendum Note (Signed)
Addended by: Mort Sawyers on: 03/22/2023 01:28 PM   Modules accepted: Orders

## 2023-03-22 NOTE — Telephone Encounter (Signed)
Sorry Kayla Erickson just saw the update to try victoza will send that in.

## 2023-03-22 NOTE — Telephone Encounter (Signed)
Can try Victoza.

## 2023-03-22 NOTE — Telephone Encounter (Signed)
PA was denied, documented in separate encounter.

## 2023-03-22 NOTE — Addendum Note (Signed)
Addended by: Mort Sawyers on: 03/22/2023 01:38 PM   Modules accepted: Orders

## 2023-03-22 NOTE — Telephone Encounter (Signed)
Has a PA been initiated for Ozempic?

## 2023-03-22 NOTE — Telephone Encounter (Signed)
Ozempic not covered by insurance Trulicity not available at pharmacy  Put in [place]  Can we please see if this is approved? And or if anything else would be approved?

## 2023-03-22 NOTE — Telephone Encounter (Signed)
Kayla Erickson not sure if you can do anything about this, I have been waiting on the p/a team and pt has been without GLP 1 for about a week due to waiting on ozempic to be approved. Trulicity is not available at pharmacy.

## 2023-03-22 NOTE — Telephone Encounter (Signed)
This patient has been without GLP 1 for a week now.  Needing ozempic p/a or another option.  Trulicity is not available at pharmacy.

## 2023-05-03 ENCOUNTER — Encounter: Payer: Self-pay | Admitting: Family

## 2023-05-03 DIAGNOSIS — E118 Type 2 diabetes mellitus with unspecified complications: Secondary | ICD-10-CM

## 2023-05-04 ENCOUNTER — Ambulatory Visit (INDEPENDENT_AMBULATORY_CARE_PROVIDER_SITE_OTHER): Payer: 59 | Admitting: Family

## 2023-05-04 ENCOUNTER — Other Ambulatory Visit: Payer: Self-pay | Admitting: Family

## 2023-05-04 ENCOUNTER — Encounter: Payer: Self-pay | Admitting: Family

## 2023-05-04 VITALS — BP 110/66 | HR 108 | Temp 98.1°F | Ht 63.75 in | Wt 129.4 lb

## 2023-05-04 DIAGNOSIS — E118 Type 2 diabetes mellitus with unspecified complications: Secondary | ICD-10-CM

## 2023-05-04 DIAGNOSIS — H00022 Hordeolum internum right lower eyelid: Secondary | ICD-10-CM | POA: Diagnosis not present

## 2023-05-04 DIAGNOSIS — M25511 Pain in right shoulder: Secondary | ICD-10-CM | POA: Diagnosis not present

## 2023-05-04 DIAGNOSIS — R7989 Other specified abnormal findings of blood chemistry: Secondary | ICD-10-CM

## 2023-05-04 DIAGNOSIS — Z7984 Long term (current) use of oral hypoglycemic drugs: Secondary | ICD-10-CM

## 2023-05-04 DIAGNOSIS — G8929 Other chronic pain: Secondary | ICD-10-CM

## 2023-05-04 LAB — POCT GLYCOSYLATED HEMOGLOBIN (HGB A1C): Hemoglobin A1C: 8.1 % — AB (ref 4.0–5.6)

## 2023-05-04 MED ORDER — MAGNESIUM OXIDE -MG SUPPLEMENT 400 (240 MG) MG PO TABS
400.0000 mg | ORAL_TABLET | Freq: Two times a day (BID) | ORAL | 1 refills | Status: AC
Start: 2023-05-04 — End: 2023-10-31

## 2023-05-04 MED ORDER — OFLOXACIN 0.3 % OP SOLN
1.0000 [drp] | Freq: Four times a day (QID) | OPHTHALMIC | 0 refills | Status: AC
Start: 2023-05-04 — End: 2023-05-11

## 2023-05-04 MED ORDER — LIRAGLUTIDE 18 MG/3ML ~~LOC~~ SOPN: 1.8000 mg | PEN_INJECTOR | Freq: Every day | SUBCUTANEOUS | Status: DC

## 2023-05-04 MED ORDER — SEMAGLUTIDE (2 MG/DOSE) 8 MG/3ML ~~LOC~~ SOPN
2.0000 mg | PEN_INJECTOR | SUBCUTANEOUS | 1 refills | Status: DC
Start: 2023-05-04 — End: 2023-05-04

## 2023-05-04 NOTE — Assessment & Plan Note (Signed)
Low last reading.  Increase mag to 400 mg twice daily

## 2023-05-04 NOTE — Assessment & Plan Note (Signed)
Rx ofloxacin  Pt has had in the past and warm compresses were not overly successful and required drops Did advise pt to apply warm compresses throughout the day as well.

## 2023-05-04 NOTE — Patient Instructions (Signed)
  A referral was placed today for an orthopedist.  Please let us know if you have not heard back within 2 weeks about the referral.  Increase magnesium to one tablet twice daily.   Regards,   Mort Sawyers FNP-C

## 2023-05-04 NOTE — Assessment & Plan Note (Signed)
 stable °

## 2023-05-04 NOTE — Assessment & Plan Note (Signed)
A1c worsening  Pt has been without medication due to unavailable.  Pt to restart victoza today as found at another pharmacy. Restart and advised pt to check glucose fasting QAM with goal <140

## 2023-05-04 NOTE — Assessment & Plan Note (Signed)
Referral placed to orthopedist.  Did advise pt to continue with exercises as tolerated.  May need MRI consideration.  Xray unremarkable 4/24

## 2023-05-04 NOTE — Progress Notes (Signed)
Established Patient Office Visit  Subjective:      CC:  Chief Complaint  Patient presents with   Diabetes   Shoulder Pain    Right-Never heard from Ortho referral   Belepharitis    Right Eye    HPI: Kayla Erickson is a 42 y.o. female presenting on 05/04/2023 for Diabetes, Shoulder Pain (Right-Never heard from Ortho referral), and Belepharitis (Right Eye) . DM2: did end up finding victoza at another pharmacy will pick up today however has been without for about two weeks. Also on jardiance 25 mg once daily.  Lab Results  Component Value Date   HGBA1C 8.1 (A) 05/04/2023   C/o right eye swelling on the lower eye. She has had a stye in the past which feels similar.   Ongoing right shoulder pain: limited lifting ability, tries to use left shoulder over the right shoulder. This has been over six months. Can only left right shoulder to 90 degree but complete extension, and can not put her arm behind her back. No known injury or trauma.  Right shoulder xray negative 02/04/23.   Elevated liver function tests, stable. U/s abdomen wnl 04/2022.   Low magnesium: taking mag 300 mg once daily currently. Will increase to twice daily as recent mag was low.      Social history:  Relevant past medical, surgical, family and social history reviewed and updated as indicated. Interim medical history since our last visit reviewed.  Allergies and medications reviewed and updated.  DATA REVIEWED: CHART IN EPIC   l  ROS: Negative unless specifically indicated above in HPI.    Current Outpatient Medications:    atomoxetine (STRATTERA) 40 MG capsule, TAKE 1 CAPSULE (40 MG TOTAL) BY MOUTH DAILY., Disp: 90 capsule, Rfl: 1   azelastine (ASTELIN) 0.1 % nasal spray, Place 1 spray into both nostrils 2 (two) times daily. Use in each nostril as directed, Disp: 30 mL, Rfl: 12   empagliflozin (JARDIANCE) 25 MG TABS tablet, Take 1 tablet (25 mg total) by mouth daily before breakfast., Disp: 90 tablet,  Rfl: 3   liraglutide (VICTOZA) 18 MG/3ML SOPN, Inject 1.8 mg into the skin daily., Disp: , Rfl:    lisinopril (ZESTRIL) 10 MG tablet, TAKE 1 TABLET BY MOUTH EVERY DAY, Disp: 90 tablet, Rfl: 2   magnesium oxide (MAG-OX) 400 (240 Mg) MG tablet, Take 1 tablet (400 mg total) by mouth 2 (two) times daily., Disp: 180 tablet, Rfl: 1   metFORMIN (GLUCOPHAGE) 1000 MG tablet, TAKE 1 TABLET (1,000 MG TOTAL) BY MOUTH TWICE A DAY WITH FOOD, Disp: 180 tablet, Rfl: 2   ofloxacin (OCUFLOX) 0.3 % ophthalmic solution, Place 1 drop into the right eye 4 (four) times daily for 7 days., Disp: 1.4 mL, Rfl: 0      Objective:    BP 110/66 (BP Location: Left Arm, Patient Position: Sitting, Cuff Size: Normal)   Pulse (!) 108   Temp 98.1 F (36.7 C) (Temporal)   Ht 5' 3.75" (1.619 m)   Wt 129 lb 6 oz (58.7 kg)   LMP 04/07/2023 (Approximate)   SpO2 97%   BMI 22.38 kg/m   Wt Readings from Last 3 Encounters:  05/04/23 129 lb 6 oz (58.7 kg)  02/04/23 127 lb 9.6 oz (57.9 kg)  08/18/22 141 lb 4 oz (64.1 kg)    Physical Exam Constitutional:      General: She is not in acute distress.    Appearance: Normal appearance. She is normal weight. She is not ill-appearing,  toxic-appearing or diaphoretic.  HENT:     Head: Normocephalic.  Cardiovascular:     Rate and Rhythm: Normal rate.  Pulmonary:     Effort: Pulmonary effort is normal.  Musculoskeletal:        General: No swelling. Normal range of motion.  Neurological:     General: No focal deficit present.     Mental Status: She is alert and oriented to person, place, and time. Mental status is at baseline.  Psychiatric:        Mood and Affect: Mood normal.        Behavior: Behavior normal.        Thought Content: Thought content normal.        Judgment: Judgment normal.           Assessment & Plan:  Diabetes mellitus type 2 with complications (HCC) Assessment & Plan: A1c worsening  Pt has been without medication due to unavailable.  Pt to restart  victoza today as found at another pharmacy. Restart and advised pt to check glucose fasting QAM with goal <140    Orders: -     Liraglutide; Inject 1.8 mg into the skin daily. -     POCT glycosylated hemoglobin (Hb A1C)  Chronic pain in right shoulder Assessment & Plan: Referral placed to orthopedist.  Did advise pt to continue with exercises as tolerated.  May need MRI consideration.  Xray unremarkable 4/24  Orders: -     Ambulatory referral to Orthopedic Surgery  Hypomagnesemia Assessment & Plan: Low last reading.  Increase mag to 400 mg twice daily  Orders: -     Magnesium Oxide -Mg Supplement; Take 1 tablet (400 mg total) by mouth 2 (two) times daily.  Dispense: 180 tablet; Refill: 1  Hordeolum internum of right lower eyelid Assessment & Plan: Rx ofloxacin  Pt has had in the past and warm compresses were not overly successful and required drops Did advise pt to apply warm compresses throughout the day as well.    Orders: -     Ofloxacin; Place 1 drop into the right eye 4 (four) times daily for 7 days.  Dispense: 1.4 mL; Refill: 0  Elevated liver function tests Assessment & Plan: stable      Return in about 3 months (around 08/04/2023) for f/u diabetes.  Mort Sawyers, MSN, APRN, FNP-C [facility]

## 2023-05-06 ENCOUNTER — Encounter: Payer: Self-pay | Admitting: Family

## 2023-05-12 ENCOUNTER — Ambulatory Visit: Payer: 59 | Admitting: Physician Assistant

## 2023-05-12 ENCOUNTER — Other Ambulatory Visit: Payer: Self-pay

## 2023-05-12 ENCOUNTER — Encounter: Payer: Self-pay | Admitting: Physician Assistant

## 2023-05-12 DIAGNOSIS — M7501 Adhesive capsulitis of right shoulder: Secondary | ICD-10-CM

## 2023-05-12 DIAGNOSIS — M25511 Pain in right shoulder: Secondary | ICD-10-CM | POA: Diagnosis not present

## 2023-05-12 DIAGNOSIS — G8929 Other chronic pain: Secondary | ICD-10-CM

## 2023-05-12 NOTE — Progress Notes (Unsigned)
   Procedure Note  Patient: Kayla Erickson             Date of Birth: Jul 22, 1981           MRN: [id number]             Visit Date: 05/12/2023  Procedures: Visit Diagnoses:  1. Chronic pain in right shoulder     Large Joint Inj: R glenohumeral on 05/12/2023 4:02 PM Details: 22 G 3.5 in needle, ultrasound-guided posterior approach Medications: 5 mL lidocaine 1 %; 9 mL bupivacaine 0.25 %; 20 mg methylPREDNISolone acetate 40 MG/ML Outcome: tolerated well, no immediate complications  Patient seen at request of Tessa Lerner PA-C.  Exam consistent with adhesive capsulitis.  Has history of diabetes with last A1c 8.1.  Intra-articular glenohumeral injection administered today under ultrasound guidance with patient tolerated procedure well.  Smaller volume of cortisone was utilized due to patient's diabetes control. Procedure, treatment alternatives, risks and benefits explained, specific risks discussed. Consent was given by the patient. Patient was prepped and draped in the usual sterile fashion.

## 2023-05-12 NOTE — Progress Notes (Signed)
Office Visit Note   Patient: Kayla Erickson           Date of Birth: [birth year]           MRN: [id number] Visit Date: 05/12/2023              Requested by: Mort Sawyers, FNP [address] [place],  [place] [address] PCP: Mort Sawyers, FNP   Assessment & Plan: Visit Diagnoses:  1. Chronic pain in right shoulder     Plan: Impression is chronic right shoulder pain likely from adhesive capsulitis.  We have discussed intra-articular cortisone injection for which she would like to proceed.  I have also sent in a referral for outpatient physical therapy.  Follow-up as needed.  Follow-Up Instructions: Return if symptoms worsen or fail to improve.   Orders:  No orders of the defined types were placed in this encounter.  No orders of the defined types were placed in this encounter.     Procedures: No procedures performed   Clinical Data: No additional findings.   Subjective: Chief Complaint  Patient presents with   Right Shoulder - Pain    HPI patient is a pleasant 42 year old female who comes in today with right shoulder pain for the past 3 months.  She denies any specific injury but notes her symptoms began after pushing a heavy cart at [employer].  The pain she has is to the anterior shoulder and is worse when she is forward flexing her arm past 90 degrees or with internal rotation of the shoulder.  She has been taking Tylenol without much relief.  She is unable to take NSAIDs due to renal issues.  No previous cortisone injection to the right shoulder.  Of note, she is a diabetic with her most recent hemoglobin A1c at 8.1.  Review of Systems as detailed in HPI.  All others reviewed and are negative.   Objective: Vital Signs: LMP 04/07/2023 (Approximate)   Physical Exam well-developed well-nourished female in no acute distress.  Alert and oriented x 3.  Ortho Exam right shoulder exam: Active forward flexion to about 90 degrees.  I can passively get her to about  110 degrees.  She has external rotation to about 70 degrees.  She can internally rotate to her back pocket.  She is neurovascularly intact distally.  Specialty Comments:  No specialty comments available.  Imaging: No new imaging   PMFS History: Patient Active Problem List   Diagnosis Date Noted   Chronic pain in right shoulder 05/04/2023   Seasonal allergic rhinitis due to pollen 06/15/2022   Elevated creatine kinase 06/15/2022   Elevated liver function tests 04/28/2022   Hypomagnesemia 04/28/2022   Attention deficit hyperactivity disorder (ADHD), predominantly inattentive type 04/28/2022   Diabetic retinopathy (HCC) 03/28/2013   Diabetes mellitus type 2 with complications (HCC)    Essential hypertension 03/25/2010   Past Medical History:  Diagnosis Date   Allergic rhinitis    Diabetes mellitus type 2 with complications (HCC)    DKA (diabetic ketoacidoses) 01/15/2017   Hypertension    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)    SINUS TACHYCARDIA 11/24/2007   Qualifier: Diagnosis of  By: Gwenlyn Perking MD, Carlos     Urine test positive for microalbuminuria 04/28/2022    Family History  Problem Relation Age of Onset   Diabetes Paternal Grandmother     Past Surgical History:  Procedure Laterality Date   WISDOM TOOTH EXTRACTION  Social History   Occupational History   Not on file  Tobacco Use   Smoking status: Never   Smokeless tobacco: Never  Vaping Use   Vaping status: Never Used  Substance and Sexual Activity   Alcohol use: Yes    Alcohol/week: 2.0 standard drinks of alcohol    Types: 2 Cans of beer per week    Comment: socially   Drug use: No   Sexual activity: Yes    Partners: Female    Birth control/protection: Other-see comments    Comment: same sex

## 2023-05-13 MED ORDER — BUPIVACAINE HCL 0.25 % IJ SOLN
9.0000 mL | INTRAMUSCULAR | Status: AC | PRN
Start: 2023-05-12 — End: 2023-05-12
  Administered 2023-05-12: 9 mL via INTRA_ARTICULAR

## 2023-05-13 MED ORDER — LIDOCAINE HCL 1 % IJ SOLN
5.0000 mL | INTRAMUSCULAR | Status: AC | PRN
Start: 2023-05-12 — End: 2023-05-12
  Administered 2023-05-12: 5 mL

## 2023-05-13 MED ORDER — METHYLPREDNISOLONE ACETATE 40 MG/ML IJ SUSP
20.0000 mg | INTRAMUSCULAR | Status: AC | PRN
Start: 2023-05-12 — End: 2023-05-12
  Administered 2023-05-12: 20 mg via INTRA_ARTICULAR

## 2023-06-01 ENCOUNTER — Telehealth: Payer: Self-pay | Admitting: Physical Therapy

## 2023-06-01 NOTE — Telephone Encounter (Signed)
Called pt to inquire about moving up in schedule. Pt did not pickup so left VM instructing pt to call back if interested in moving up.

## 2023-06-06 ENCOUNTER — Encounter: Payer: Self-pay | Admitting: Family

## 2023-06-06 DIAGNOSIS — U071 COVID-19: Secondary | ICD-10-CM

## 2023-06-07 MED ORDER — MOLNUPIRAVIR EUA 200MG CAPSULE: 4.0000 | ORAL_CAPSULE | Freq: Two times a day (BID) | ORAL | 0 refills | Status: DC

## 2023-06-07 MED ORDER — NIRMATRELVIR/RITONAVIR (PAXLOVID) TABLET (RENAL DOSING)
2.0000 | ORAL_TABLET | Freq: Two times a day (BID) | ORAL | 0 refills | Status: AC
Start: 2023-06-07 — End: 2023-06-12

## 2023-06-07 NOTE — Addendum Note (Signed)
Addended by: Mort Sawyers on: 06/07/2023 05:01 PM   Modules accepted: Orders

## 2023-06-09 ENCOUNTER — Ambulatory Visit: Payer: 59 | Admitting: Physical Therapy

## 2023-06-16 ENCOUNTER — Ambulatory Visit: Payer: 59 | Attending: Physician Assistant | Admitting: Physical Therapy

## 2023-06-16 ENCOUNTER — Encounter: Payer: 59 | Admitting: Physical Therapy

## 2023-06-16 DIAGNOSIS — G8929 Other chronic pain: Secondary | ICD-10-CM | POA: Diagnosis not present

## 2023-06-16 DIAGNOSIS — M25511 Pain in right shoulder: Secondary | ICD-10-CM | POA: Diagnosis not present

## 2023-06-16 NOTE — Therapy (Unsigned)
OUTPATIENT PHYSICAL THERAPY SHOULDER EVALUATION   Patient Name: Kayla Erickson MRN: [id number] DOB:1981/08/27, 42 y.o., female Today's Date: 06/16/2023  END OF SESSION:  PT End of Session - 06/16/23 1702     Visit Number 1    Number of Visits 20    Date for PT Re-Evaluation 08/25/23    Authorization Type [employer] 2024 30 OT/PT    Authorization - Visit Number 1    Authorization - Number of Visits 20    Progress Note Due on Visit 10    PT Start Time 1515    PT Stop Time 1600    PT Time Calculation (min) 45 min    Activity Tolerance Patient limited by pain    Behavior During Therapy [facility] for tasks assessed/performed             Past Medical History:  Diagnosis Date   Allergic rhinitis    Diabetes mellitus type 2 with complications (HCC)    DKA (diabetic ketoacidoses) 01/15/2017   Hypertension    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)    SINUS TACHYCARDIA 11/24/2007   Qualifier: Diagnosis of  By: Gwenlyn Perking MD, Carlos     Urine test positive for microalbuminuria 04/28/2022   Past Surgical History:  Procedure Laterality Date   WISDOM TOOTH EXTRACTION     Patient Active Problem List   Diagnosis Date Noted   Chronic pain in right shoulder 05/04/2023   Seasonal allergic rhinitis due to pollen 06/15/2022   Elevated creatine kinase 06/15/2022   Elevated liver function tests 04/28/2022   Hypomagnesemia 04/28/2022   Attention deficit hyperactivity disorder (ADHD), predominantly inattentive type 04/28/2022   Diabetic retinopathy (HCC) 03/28/2013   Diabetes mellitus type 2 with complications (HCC)    Essential hypertension 03/25/2010    PCP: Dr. Mort Sawyers   REFERRING PROVIDER: Dr. Jari Sportsman   REFERRING DIAG: [phone] (ICD-10-CM) - Chronic pain in right shoulder  THERAPY DIAG:  No diagnosis found.  Rationale for Evaluation and Treatment: Rehabilitation  ONSET DATE: 12/16/2022   SUBJECTIVE:                                                                                                                                                                                       SUBJECTIVE STATEMENT: See pertinent history   Hand dominance: Right  PERTINENT HISTORY: Pt reports that her right shoulder pain started when pushing a heavy shopping cart at [employer] which was 6 mo ago. Her shoulder has since pained her. 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 Yes: NPRS scale:  5/10 Pain location: Periscapular  Pain description: Achy  Aggravating factors: Reaching overhead and internally rotating arm  Relieving factors: Tylenol but not much helps. Heat helps some   PRECAUTIONS: None  RED FLAGS: None   WEIGHT BEARING RESTRICTIONS: No  FALLS:  Has patient fallen in last 6 months? No    OCCUPATION: Barista at [employer]   PLOF: Independent  PATIENT GOALS:Reach up her head  NEXT MD VISIT: Not sure   OBJECTIVE:   VITALS: BP 98/66 HR 120 SpO2 100%  DIAGNOSTIC FINDINGS:  CLINICAL DATA:  Right shoulder pain   EXAM: RIGHT SHOULDER - 2+ VIEW   COMPARISON:  None Available.   FINDINGS: There is no evidence of fracture or dislocation. There is no evidence of arthropathy or other focal bone abnormality. Soft tissues are unremarkable.   IMPRESSION: Negative.     Electronically Signed   By: Gerome Sam III M.D.   On: 02/04/2023 20:27    PATIENT SURVEYS:  FOTO 60 with target of 72   COGNITION: Overall cognitive status: Within functional limits for tasks assessed     SENSATION: WFL  POSTURE: No deficits   UPPER EXTREMITY ROM:   Active /Passive ROM Right eval Left eval  Shoulder flexion 120*/120* 180/180  Shoulder extension    Shoulder abduction 90*/110* 180/180  Shoulder adduction    Shoulder internal rotation    Shoulder external rotation    Elbow flexion    Elbow extension    Wrist  flexion    Wrist extension    Wrist ulnar deviation    Wrist radial deviation    Wrist pronation    Wrist supination    (Blank rows = not tested)         UPPER EXTREMITY MMT:  MMT Right eval Left eval  Shoulder flexion 4+ 4+  Shoulder extension    Shoulder abduction 4+ 4-  Shoulder adduction    Shoulder internal rotation    Shoulder external rotation    Middle trapezius 4- 4  Lower trapezius 3- 4-  Elbow flexion    Elbow extension    Wrist flexion    Wrist extension    Wrist ulnar deviation    Wrist radial deviation    Wrist pronation    Wrist supination    Grip strength (lbs)    (Blank rows = not tested)  SHOULDER SPECIAL TESTS: Impingement tests: Painful arc test: positive  and   SLAP lesions:  NT  Rotator cuff assessment: Drop arm test: negative, Empty can test: Not performed , Full can test: negative, Belly press test: positive , and Infraspinatus test: negative Biceps assessment: Speed's test: positive   JOINT MOBILITY TESTING:  NT   PALPATION:  Right supra and infraspinatus TTP    TODAY'S TREATMENT:  DATE:   06/16/23: Shoulder Pulleys Flexion and Extension AAROM 3 x 10  Shoulder Pulleys Abduction and Adduction AAROM 3 x 10  Upper Trap Stretch 3 x 30 sec    PATIENT EDUCATION: Education details: form and technique for correct performance of exercise  Person educated: Patient Education method: Explanation, Demonstration, Verbal cues, and Handouts Education comprehension: verbalized understanding, returned demonstration, and verbal cues required  HOME EXERCISE PROGRAM: Access Code: [email or web address] URL: [email or web address] Date: 06/16/2023 Prepared by: Ellin Goodie  Exercises - Seated Shoulder Flexion AAROM with Pulley Behind  - 1 x daily - 3 sets - 10 reps - Seated Shoulder Abduction AAROM with  Pulley Behind  - 1 x daily - 3 sets - 10 reps - Seated Upper Trapezius Stretch  - 1 x daily - 3 reps - 30 sec hold  ASSESSMENT:  CLINICAL IMPRESSION: Patient is a 42 y.o. white female who was seen today for physical therapy evaluation and treatment for chronic right shoulder pain. She presents with signs and symptoms that indicate she has subacromial impingement syndrome along with rotator cuff and bicep tendinitis along with the start of adhesive capsulitis. She has pain and decreased ROM with overhead movements along with pain with resisted internal rotation and flexion. Also, of concern, PT recorded elevated resting HR of 126 bpm, which pt described as white coat syndrome. Pt does have a history of sinus tachycardia, but PT recommends that she be seen by medical team for further evaluation and treatment. PT will message PCP about pt's elevated HR. 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   OBJECTIVE IMPAIRMENTS: decreased ROM, decreased strength, impaired UE functional use, and pain.   ACTIVITY LIMITATIONS: carrying, lifting, reach over head, and hygiene/grooming  PARTICIPATION LIMITATIONS: shopping and occupation  PERSONAL FACTORS: Time since onset of injury/illness/exacerbation and 1-2 comorbidities: Sinus Tachycardia   are also affecting patient's functional outcome.   REHAB POTENTIAL: Good  CLINICAL DECISION MAKING: Stable/uncomplicated  EVALUATION COMPLEXITY: Low   GOALS: Goals reviewed with patient? No  SHORT TERM GOALS: Target date: 06/30/2023  Pt will be independent with HEP in order to improve strength and balance in order to decrease fall risk and improve function at home and work. Baseline: NT  Goal status: INITIAL  2.  Patient will identify UE activities at  job or at home that she needs to modify to avoid overuse of right shoulder to improve RUE function  Baseline: NT  Goal status: INITIAL    LONG TERM GOALS: Target date: 08/25/2023  Patient will have improved function and activity level as evidenced by an increase in FOTO score by 10 points or more.  Baseline: 60/100 with target of 72   Goal status: INITIAL  2.  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  Baseline: Shoulder Flex R/L 120/180, Shoulder Abd R/L 90/180 Goal status: INITIAL  3.  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  Baseline:  Shoulder Abd R/L 4-/4+, Mid Trap R/L 4/4-   Goal status: INITIAL  PLAN:  PT FREQUENCY: 1-2x/week  PT DURATION: 10 weeks  PLANNED INTERVENTIONS: Therapeutic exercises, Therapeutic activity, Neuromuscular re-education, Gait training, Patient/Family education, Self Care, Joint mobilization, Joint manipulation, Aquatic Therapy, Dry Needling, Electrical stimulation, Spinal manipulation, Spinal mobilization, Cryotherapy, Moist heat, Manual therapy, and Re-evaluation  PLAN FOR NEXT SESSION: AAROM and shoulder strengthening below 90 degrees   Ellin Goodie PT, DPT  [facility] [address] [place], [address] Phone: [phone]   Fax:  [phone]

## 2023-06-23 ENCOUNTER — Ambulatory Visit: Payer: 59 | Attending: Physician Assistant | Admitting: Physical Therapy

## 2023-06-23 DIAGNOSIS — M25511 Pain in right shoulder: Secondary | ICD-10-CM | POA: Insufficient documentation

## 2023-06-23 DIAGNOSIS — G8929 Other chronic pain: Secondary | ICD-10-CM | POA: Insufficient documentation

## 2023-06-23 NOTE — Therapy (Signed)
OUTPATIENT PHYSICAL THERAPY SHOULDER TREATMENT    Patient Name: Kayla Erickson MRN: [id number] DOB:May 25, 1981, 42 y.o., female Today's Date: 06/23/2023  END OF SESSION:  PT End of Session - 06/23/23 1123     Visit Number 2    Number of Visits 20    Date for PT Re-Evaluation 08/25/23    Authorization Type [employer] 2024 30 OT/PT    Authorization - Visit Number 2    Authorization - Number of Visits 20    Progress Note Due on Visit 10    PT Start Time 1120    PT Stop Time 1200    PT Time Calculation (min) 40 min    Activity Tolerance Patient limited by pain    Behavior During Therapy [facility] for tasks assessed/performed              Past Medical History:  Diagnosis Date   Allergic rhinitis    Diabetes mellitus type 2 with complications (HCC)    DKA (diabetic ketoacidoses) 01/15/2017   Hypertension    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)    SINUS TACHYCARDIA 11/24/2007   Qualifier: Diagnosis of  By: Gwenlyn Perking MD, Carlos     Urine test positive for microalbuminuria 04/28/2022   Past Surgical History:  Procedure Laterality Date   WISDOM TOOTH EXTRACTION     Patient Active Problem List   Diagnosis Date Noted   Chronic pain in right shoulder 05/04/2023   Seasonal allergic rhinitis due to pollen 06/15/2022   Elevated creatine kinase 06/15/2022   Elevated liver function tests 04/28/2022   Hypomagnesemia 04/28/2022   Attention deficit hyperactivity disorder (ADHD), predominantly inattentive type 04/28/2022   Diabetic retinopathy (HCC) 03/28/2013   Diabetes mellitus type 2 with complications (HCC)    Essential hypertension 03/25/2010    PCP: Dr. Mort Sawyers   REFERRING PROVIDER: Dr. Jari Sportsman   REFERRING DIAG: [phone] (ICD-10-CM) - Chronic pain in right shoulder  THERAPY DIAG:  No diagnosis found.  Rationale for Evaluation and Treatment: Rehabilitation  ONSET DATE: 12/16/2022   SUBJECTIVE:                                                                                                                                                                                       SUBJECTIVE STATEMENT: Pt reports purchasing a pulse oximeter and she will measure HR periodically. She reports ongoing intermittent pain with activity.  See pertinent history   Hand dominance: Right  PERTINENT HISTORY: Pt reports that her right shoulder pain started when pushing a heavy shopping cart at [employer] which was 6 mo ago. Her shoulder has since pained her. 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 Yes: NPRS scale: 5/10 Pain location: Periscapular  Pain description: Achy  Aggravating factors: Reaching overhead and internally rotating arm  Relieving factors: Tylenol but not much helps. Heat helps some   PRECAUTIONS: None  RED FLAGS: None   WEIGHT BEARING RESTRICTIONS: No  FALLS:  Has patient fallen in last 6 months? No    OCCUPATION: Barista at [employer]   PLOF: Independent  PATIENT GOALS:Reach up her head  NEXT MD VISIT: Not sure   OBJECTIVE:   VITALS: BP 98/66 HR 120 SpO2 100%  DIAGNOSTIC FINDINGS:  CLINICAL DATA:  Right shoulder pain   EXAM: RIGHT SHOULDER - 2+ VIEW   COMPARISON:  None Available.   FINDINGS: There is no evidence of fracture or dislocation. There is no evidence of arthropathy or other focal bone abnormality. Soft tissues are unremarkable.   IMPRESSION: Negative.     Electronically Signed   By: Gerome Sam III M.D.   On: 02/04/2023 20:27    PATIENT SURVEYS:  FOTO 60 with target of 72   COGNITION: Overall cognitive status: Within functional limits for tasks assessed     SENSATION: WFL  POSTURE: No deficits   UPPER EXTREMITY ROM:   Active /Passive ROM Right eval Left eval  Shoulder flexion 120*/120* 180/180  Shoulder extension    Shoulder abduction 90*/110* 180/180   Shoulder adduction    Shoulder internal rotation    Shoulder external rotation    Elbow flexion    Elbow extension    Wrist flexion    Wrist extension    Wrist ulnar deviation    Wrist radial deviation    Wrist pronation    Wrist supination    (Blank rows = not tested)         UPPER EXTREMITY MMT:  MMT Right eval Left eval  Shoulder flexion 4+ 4+  Shoulder extension    Shoulder abduction 4+ 4-  Shoulder adduction    Shoulder internal rotation    Shoulder external rotation    Middle trapezius 4- 4  Lower trapezius 3- 4-  Elbow flexion    Elbow extension    Wrist flexion    Wrist extension    Wrist ulnar deviation    Wrist radial deviation    Wrist pronation    Wrist supination    Grip strength (lbs)    (Blank rows = not tested)  SHOULDER SPECIAL TESTS: Impingement tests: Painful arc test: positive  and   SLAP lesions:  NT  Rotator cuff assessment: Drop arm test: negative, Empty can test: Not performed , Full can test: negative, Belly press test: positive , and Infraspinatus test: negative Biceps assessment: Speed's test: positive   JOINT MOBILITY TESTING:  NT   PALPATION:  Right supra and infraspinatus TTP    TODAY'S TREATMENT:  DATE:   06/23/23: UBE seat at 9 2.5 forward and 2.5 backward for 5 min  Shoulder Pulley's AAROM Flexion and Extension 3 x 10  Shoulder Pulley's AAROM Abduction and Adduction 3 x 10 OMEGA Seated Rows #15 1 x 10   OMEGA Seated Rows #20 1 x 10  OMEGA Seated Rows #25 1 x 10  -min VC to decrease shoulder extension  Shoulder T's 3 x 10  Shoulder Scaption AROM 3 x 10      06/16/23: Shoulder Pulleys Flexion and Extension AAROM 3 x 10  Shoulder Pulleys Abduction and Adduction AAROM 3 x 10  Upper Trap Stretch 3 x 30 sec    PATIENT EDUCATION: Education details: form and technique for correct  performance of exercise  Person educated: Patient Education method: Programmer, multimedia, Demonstration, Verbal cues, and Handouts Education comprehension: verbalized understanding, returned demonstration, and verbal cues required  HOME EXERCISE PROGRAM: Access Code: [email or web address] URL: [email or web address] Date: 06/23/2023 Prepared by: Ellin Goodie  Exercises - Seated Shoulder Flexion AAROM with Pulley Behind  - 1 x daily - 3 sets - 10 reps - Seated Shoulder Abduction AAROM with Pulley Behind  - 1 x daily - 3 sets - 10 reps - Seated Upper Trapezius Stretch  - 1 x daily - 3 reps - 30 sec hold - Standing Shoulder Horizontal Abduction - Palms Down  - 1 x daily - 3-4 x weekly - 3 sets - 10 reps - Standing Shoulder Scaption  - 2-3 x weekly - 3 sets - 10 reps  ASSESSMENT:  CLINICAL IMPRESSION: Pt able to perform all shoulder exercises without an increase in their pain. Exercises modified to include shoulder and periscapular strengthening below 90 degrees. 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    OBJECTIVE IMPAIRMENTS: decreased ROM, decreased strength, impaired UE functional use, and pain.   ACTIVITY LIMITATIONS: carrying, lifting, reach over head, and hygiene/grooming  PARTICIPATION LIMITATIONS: shopping and occupation  PERSONAL FACTORS: Time since onset of injury/illness/exacerbation and 1-2 comorbidities: Sinus Tachycardia   are also affecting patient's functional outcome.   REHAB POTENTIAL: Good  CLINICAL DECISION MAKING: Stable/uncomplicated  EVALUATION COMPLEXITY: Low   GOALS: Goals reviewed with patient? No  SHORT TERM GOALS: Target date: 06/30/2023  Pt will be independent with HEP in order to improve strength and balance in order to decrease  fall risk and improve function at home and work. Baseline: NT  Goal status: ONGOING   2.  Patient will identify UE activities at job or at home that she needs to modify to avoid overuse of right shoulder to improve RUE function  Baseline: NT  Goal status: ONGOING     LONG TERM GOALS: Target date: 08/25/2023  Patient will have improved function and activity level as evidenced by an increase in FOTO score by 10 points or more.  Baseline: 60/100 with target of 72   Goal status: ONGOING   2.  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  Baseline: Shoulder Flex R/L 120/180, Shoulder Abd R/L 90/180 Goal status: ONGOING   3.  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  Baseline:  Shoulder Abd R/L 4-/4+, Mid Trap R/L 4/4-   Goal status: ONGOING     PLAN:  PT FREQUENCY: 1-2x/week  PT DURATION: 10 weeks  PLANNED INTERVENTIONS: Therapeutic exercises, Therapeutic activity, Neuromuscular re-education, Gait training, Patient/Family education, Self Care, Joint mobilization, Joint manipulation, Aquatic Therapy, Dry Needling, Electrical stimulation, Spinal manipulation, Spinal mobilization, Cryotherapy, Moist heat, Manual therapy, and Re-evaluation  PLAN FOR NEXT SESSION: Continue to progress shoulder and periscapular strengthening: Lower Trap Wall Setting,side lying shoulder ER, etc   Ellin Goodie PT, DPT  [facility] [address] [place], [address] Phone: [phone]   Fax:  [phone]

## 2023-06-29 ENCOUNTER — Ambulatory Visit: Payer: 59 | Admitting: Physical Therapy

## 2023-07-05 ENCOUNTER — Encounter: Payer: 59 | Admitting: Physical Therapy

## 2023-07-07 ENCOUNTER — Encounter: Payer: Self-pay | Admitting: Physical Therapy

## 2023-07-07 ENCOUNTER — Ambulatory Visit: Payer: 59 | Admitting: Physical Therapy

## 2023-07-07 DIAGNOSIS — M25511 Pain in right shoulder: Secondary | ICD-10-CM | POA: Diagnosis not present

## 2023-07-07 DIAGNOSIS — G8929 Other chronic pain: Secondary | ICD-10-CM | POA: Diagnosis not present

## 2023-07-07 NOTE — Therapy (Signed)
OUTPATIENT PHYSICAL THERAPY SHOULDER TREATMENT    Patient Name: Kayla Erickson MRN: [id number] DOB:06/29/1981, 42 y.o., female Today's Date: 07/07/2023  END OF SESSION:  PT End of Session - 07/07/23 1440     Visit Number 3    Number of Visits 20    Date for PT Re-Evaluation 08/25/23    Authorization Type [employer] 2024 30 OT/PT    Authorization - Visit Number 3    Authorization - Number of Visits 20    Progress Note Due on Visit 10    PT Start Time 1438    PT Stop Time 1518    PT Time Calculation (min) 40 min    Activity Tolerance Patient limited by pain    Behavior During Therapy [facility] for tasks assessed/performed              Past Medical History:  Diagnosis Date   Allergic rhinitis    Diabetes mellitus type 2 with complications (HCC)    DKA (diabetic ketoacidoses) 01/15/2017   Hypertension    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)    SINUS TACHYCARDIA 11/24/2007   Qualifier: Diagnosis of  By: Gwenlyn Perking MD, Carlos     Urine test positive for microalbuminuria 04/28/2022   Past Surgical History:  Procedure Laterality Date   WISDOM TOOTH EXTRACTION     Patient Active Problem List   Diagnosis Date Noted   Chronic pain in right shoulder 05/04/2023   Seasonal allergic rhinitis due to pollen 06/15/2022   Elevated creatine kinase 06/15/2022   Elevated liver function tests 04/28/2022   Hypomagnesemia 04/28/2022   Attention deficit hyperactivity disorder (ADHD), predominantly inattentive type 04/28/2022   Diabetic retinopathy (HCC) 03/28/2013   Diabetes mellitus type 2 with complications (HCC)    Essential hypertension 03/25/2010    PCP: Dr. Mort Sawyers   REFERRING PROVIDER: Dr. Jari Sportsman   REFERRING DIAG: [phone] (ICD-10-CM) - Chronic pain in right shoulder  THERAPY DIAG:  Chronic right shoulder pain  Rationale for Evaluation and Treatment: Rehabilitation  ONSET DATE: 12/16/2022   SUBJECTIVE:                                                                                                                                                                                       SUBJECTIVE STATEMENT: Pt states that she believes she is able to reach up higher with her shoulder. She reports that she is not experiencing increased pain.   See pertinent history   Hand dominance: Right  PERTINENT HISTORY: Pt reports that her right shoulder pain started when pushing a heavy shopping cart at [employer] which was 6 mo ago. Her shoulder  has since pained her. 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 Yes: NPRS scale: 3/10 Pain location: Periscapular  Pain description: Achy  Aggravating factors: Reaching overhead and internally rotating arm  Relieving factors: Tylenol but not much helps. Heat helps some   PRECAUTIONS: None  RED FLAGS: None   WEIGHT BEARING RESTRICTIONS: No  FALLS:  Has patient fallen in last 6 months? 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   FINDINGS: There is no evidence of fracture or dislocation. There is no evidence of arthropathy or other focal bone abnormality. Soft tissues are unremarkable.   IMPRESSION: Negative.     Electronically Signed   By: Gerome Sam III M.D.   On: 02/04/2023 20:27    PATIENT SURVEYS:  FOTO 60 with target of 72   COGNITION: Overall cognitive status: Within functional limits for tasks assessed     SENSATION: WFL  POSTURE: No deficits   UPPER EXTREMITY ROM:   Active /Passive ROM Right eval Left eval  Shoulder flexion 120*/120* 180/180  Shoulder extension    Shoulder abduction  90*/110* 180/180  Shoulder adduction    Shoulder internal rotation    Shoulder external rotation    Elbow flexion    Elbow extension    Wrist flexion    Wrist extension    Wrist ulnar deviation    Wrist radial deviation    Wrist pronation    Wrist supination    (Blank rows = not tested)         UPPER EXTREMITY MMT:  MMT Right eval Left eval  Shoulder flexion 4+ 4+  Shoulder extension    Shoulder abduction 4+ 4-  Shoulder adduction    Shoulder internal rotation    Shoulder external rotation    Middle trapezius 4- 4  Lower trapezius 3- 4-  Elbow flexion    Elbow extension    Wrist flexion    Wrist extension    Wrist ulnar deviation    Wrist radial deviation    Wrist pronation    Wrist supination    Grip strength (lbs)    (Blank rows = not tested)  SHOULDER SPECIAL TESTS: Impingement tests: Painful arc test: positive  and   SLAP lesions:  NT  Rotator cuff assessment: Drop arm test: negative, Empty can test: Not performed , Full can test: negative, Belly press test: positive , and Infraspinatus test: negative Biceps assessment: Speed's test: positive   JOINT MOBILITY TESTING:  NT   PALPATION:  Right supra and infraspinatus TTP    TODAY'S TREATMENT:  DATE:   07/07/23: AAROM Shoulder Flex and Ext 3 x 10  AAROM Shoulder Abd and Add 3 x10 OMEGA Seated Row #25 3 x 10   OMEGA Lat Pull Downs  #10 1 x 10  -Pt compensates on RUE with increased upper trap activation  OMEGA Single UE Pull Down #5 2 x 10  Shoulder Abduction AROM to 90 deg on LUE 3 x 10  Shoulder Flexion AROM to 90 deg on LUE 3 x 10    06/23/23: UBE seat at 9 2.5 forward and 2.5 backward for 5 min  Shoulder Pulley's AAROM Flexion and Extension 3 x 10  Shoulder Pulley's AAROM Abduction and Adduction 3 x 10 OMEGA Seated Rows #15 1 x 10   OMEGA Seated Rows #20 1 x  10  OMEGA Seated Rows #25 1 x 10  -min VC to decrease shoulder extension  Shoulder T's 3 x 10  Shoulder Scaption AROM 3 x 10      06/16/23: Shoulder Pulleys Flexion and Extension AAROM 3 x 10  Shoulder Pulleys Abduction and Adduction AAROM 3 x 10  Upper Trap Stretch 3 x 30 sec    PATIENT EDUCATION: Education details: form and technique for correct performance of exercise  Person educated: Patient Education method: Programmer, multimedia, Demonstration, Verbal cues, and Handouts Education comprehension: verbalized understanding, returned demonstration, and verbal cues required  HOME EXERCISE PROGRAM: Access Code: [email or web address] URL: [email or web address] Date: 07/07/2023 Prepared by: Ellin Goodie  Exercises - Standing Overhead Shoulder External Rotation Stretch with Towel  - 1 x daily - 3 reps - 30-60 sec hold - Seated Shoulder Flexion AAROM with Pulley Behind  - 1 x daily - 3 sets - 10 reps - Seated Shoulder Abduction AAROM with Pulley Behind  - 1 x daily - 3 sets - 10 reps - Seated Upper Trapezius Stretch  - 1 x daily - 3 reps - 30 sec hold - Standing Shoulder Horizontal Abduction - Palms Down  - 1 x daily - 3-4 x weekly - 3 sets - 10 reps - Standing Shoulder Flexion to 180 degrees with dumbbells   - 3-4 x weekly - 3 sets - 10 reps - Shoulder Abduction with Dumbbells - Thumbs Up  - 3-4 x weekly - 3 sets - 10 reps  ASSESSMENT:  CLINICAL IMPRESSION: Pt demonstrates significant improvement with right shoulder ROM and strength with ability to perform AROM exercises and reach overhead. HEP modified to include exercises that include increased ROM to begin to strengthen within this ROM. 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   OBJECTIVE IMPAIRMENTS: decreased  ROM, decreased strength, impaired UE functional use, and pain.   ACTIVITY LIMITATIONS: carrying, lifting, reach over head, and hygiene/grooming  PARTICIPATION LIMITATIONS: shopping and occupation  PERSONAL FACTORS: Time since onset of injury/illness/exacerbation and 1-2 comorbidities: Sinus Tachycardia   are also affecting patient's functional outcome.   REHAB POTENTIAL: Good  CLINICAL DECISION MAKING: Stable/uncomplicated  EVALUATION COMPLEXITY: Low   GOALS: Goals reviewed with patient? No  SHORT TERM GOALS: Target date: 06/30/2023  Pt will be independent with HEP in order to improve strength and balance in order to decrease fall risk and improve function at home and work. Baseline: NT 07/07/23: Performing Independently  Goal status: ACHIEVED   2.  Patient will identify UE activities at  job or at home that she needs to modify to avoid overuse of right shoulder to improve RUE function  Baseline: NT  Goal status: ONGOING     LONG TERM GOALS: Target date: 08/25/2023  Patient will have improved function and activity level as evidenced by an increase in FOTO score by 10 points or more.  Baseline: 60/100 with target of 72   Goal status: ONGOING   2.  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  Baseline: Shoulder Flex R/L 120/180, Shoulder Abd R/L 90/180 Goal status: ONGOING   3.  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  Baseline:  Shoulder Abd R/L 4-/4+, Mid Trap R/L 4/4-   Goal status: ONGOING     PLAN:  PT FREQUENCY: 1-2x/week  PT DURATION: 10 weeks  PLANNED INTERVENTIONS: Therapeutic exercises, Therapeutic activity, Neuromuscular re-education,  Gait training, Patient/Family education, Self Care, Joint mobilization, Joint manipulation, Aquatic Therapy, Dry Needling, Electrical stimulation, Spinal manipulation, Spinal mobilization, Cryotherapy, Moist heat, Manual therapy, and Re-evaluation  PLAN FOR NEXT SESSION: FOTO. Continue to progress shoulder and periscapular strengthening: Lower Trap Wall Setting,side lying shoulder ER, PNF D2 FLEXION and Extension   Ellin Goodie PT, DPT  [facility] [address], [place], [address] Phone: [phone]   Fax:  [phone]

## 2023-07-12 ENCOUNTER — Encounter: Payer: 59 | Admitting: Physical Therapy

## 2023-07-15 ENCOUNTER — Ambulatory Visit: Payer: 59 | Admitting: Physical Therapy

## 2023-07-19 ENCOUNTER — Ambulatory Visit: Payer: 59 | Admitting: Physical Therapy

## 2023-07-19 DIAGNOSIS — G8929 Other chronic pain: Secondary | ICD-10-CM | POA: Diagnosis not present

## 2023-07-19 DIAGNOSIS — M25511 Pain in right shoulder: Secondary | ICD-10-CM | POA: Diagnosis not present

## 2023-07-19 NOTE — Therapy (Signed)
OUTPATIENT PHYSICAL THERAPY SHOULDER TREATMENT    Patient Name: Kayla Erickson MRN: [id number] DOB:1980/12/21, 42 y.o., female Today's Date: 07/19/2023  END OF SESSION:  PT End of Session - 07/19/23 1303     Visit Number 4    Number of Visits 20    Date for PT Re-Evaluation 08/25/23    Authorization Type [employer] 2024 30 OT/PT    Authorization - Visit Number 4    Authorization - Number of Visits 20    Progress Note Due on Visit 10    PT Start Time 1120    PT Stop Time 1200    PT Time Calculation (min) 40 min    Activity Tolerance Patient tolerated treatment well    Behavior During Therapy WFL for tasks assessed/performed               Past Medical History:  Diagnosis Date   Allergic rhinitis    Diabetes mellitus type 2 with complications (HCC)    DKA (diabetic ketoacidoses) 01/15/2017   Hypertension    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)    SINUS TACHYCARDIA 11/24/2007   Qualifier: Diagnosis of  By: Gwenlyn Perking MD, Carlos     Urine test positive for microalbuminuria 04/28/2022   Past Surgical History:  Procedure Laterality Date   WISDOM TOOTH EXTRACTION     Patient Active Problem List   Diagnosis Date Noted   Chronic pain in right shoulder 05/04/2023   Seasonal allergic rhinitis due to pollen 06/15/2022   Elevated creatine kinase 06/15/2022   Elevated liver function tests 04/28/2022   Hypomagnesemia 04/28/2022   Attention deficit hyperactivity disorder (ADHD), predominantly inattentive type 04/28/2022   Diabetic retinopathy (HCC) 03/28/2013   Diabetes mellitus type 2 with complications (HCC)    Essential hypertension 03/25/2010    PCP: Dr. Mort Sawyers   REFERRING PROVIDER: Dr. Jari Sportsman   REFERRING DIAG: [phone] (ICD-10-CM) - Chronic pain in right shoulder  THERAPY DIAG:  Chronic right shoulder pain  Rationale for Evaluation and Treatment: Rehabilitation  ONSET DATE: 12/16/2022   SUBJECTIVE:                                                                                                                                                                                       SUBJECTIVE STATEMENT: Pt reports some increase in her right shoulder pain due to increased workload and need to use right shoulder at work more because of a busy weekend.  See pertinent history   Hand dominance: Right  PERTINENT HISTORY: Pt reports that her right shoulder pain started when pushing a heavy shopping cart at [employer] which was 6 mo  ago. Her shoulder has since pained her. 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 Yes: NPRS scale: 1/10 Pain location: Periscapular  Pain description: Achy  Aggravating factors: Reaching overhead and internally rotating arm  Relieving factors: Tylenol but not much helps. Heat helps some   PRECAUTIONS: None  RED FLAGS: None   WEIGHT BEARING RESTRICTIONS: No  FALLS:  Has patient fallen in last 6 months? No    OCCUPATION: Barista at [employer]   PLOF: Independent  PATIENT GOALS:Reach up her head  NEXT MD VISIT: Not sure   OBJECTIVE:   VITALS: BP 98/66 HR 120 SpO2 100%  DIAGNOSTIC FINDINGS:  CLINICAL DATA:  Right shoulder pain   EXAM: RIGHT SHOULDER - 2+ VIEW   COMPARISON:  None Available.   FINDINGS: There is no evidence of fracture or dislocation. There is no evidence of arthropathy or other focal bone abnormality. Soft tissues are unremarkable.   IMPRESSION: Negative.     Electronically Signed   By: Gerome Sam III M.D.   On: 02/04/2023 20:27    PATIENT SURVEYS:  FOTO 60 with target of 72   COGNITION: Overall cognitive status: Within functional limits for tasks assessed     SENSATION: WFL  POSTURE: No deficits   UPPER EXTREMITY ROM:   Active /Passive ROM Right eval Left eval  Shoulder flexion 120*/120* 180/180  Shoulder extension     Shoulder abduction 90*/110* 180/180  Shoulder adduction    Shoulder internal rotation    Shoulder external rotation    Elbow flexion    Elbow extension    Wrist flexion    Wrist extension    Wrist ulnar deviation    Wrist radial deviation    Wrist pronation    Wrist supination    (Blank rows = not tested)         UPPER EXTREMITY MMT:  MMT Right eval Left eval  Shoulder flexion 4+ 4+  Shoulder extension    Shoulder abduction 4+ 4-  Shoulder adduction    Shoulder internal rotation    Shoulder external rotation    Middle trapezius 4- 4  Lower trapezius 3- 4-  Elbow flexion    Elbow extension    Wrist flexion    Wrist extension    Wrist ulnar deviation    Wrist radial deviation    Wrist pronation    Wrist supination    Grip strength (lbs)    (Blank rows = not tested)  SHOULDER SPECIAL TESTS: Impingement tests: Painful arc test: positive  and   SLAP lesions:  NT  Rotator cuff assessment: Drop arm test: negative, Empty can test: Not performed , Full can test: negative, Belly press test: positive , and Infraspinatus test: negative Biceps assessment: Speed's test: positive   JOINT MOBILITY TESTING:  NT   PALPATION:  Right supra and infraspinatus TTP    TODAY'S TREATMENT:  DATE:   07/19/23:  THEREX:  UBE with seat at 10-2.5 min forward and 2.5 min backward  OMEGA Seated Row #25 1 x 10 -Pt shows increased compensation on RUE with biceps over activation  Single Arm (RUE) with water jug 1 x 10   Standing Internal Rotation Stretch on RUE 3 x 30 sec  Left Side Lying External Rotation at 0 deg abduction with #1 DB 3 x 10  FOTO: 56/100    MANUAL  Right upper trap and periscapular soft tissue massage      07/07/23: AAROM Shoulder Flex and Ext 3 x 10  AAROM Shoulder Abd and Add 3 x10 OMEGA Seated Row #25 3 x 10   OMEGA Lat  Pull Downs  #10 1 x 10  -Pt compensates on RUE with increased upper trap activation  OMEGA Single UE Pull Down #5 2 x 10  Shoulder Abduction AROM to 90 deg on LUE 3 x 10  Shoulder Flexion AROM to 90 deg on LUE 3 x 10    06/23/23: UBE seat at 9 2.5 forward and 2.5 backward for 5 min  Shoulder Pulley's AAROM Flexion and Extension 3 x 10  Shoulder Pulley's AAROM Abduction and Adduction 3 x 10 OMEGA Seated Rows #15 1 x 10   OMEGA Seated Rows #20 1 x 10  OMEGA Seated Rows #25 1 x 10  -min VC to decrease shoulder extension  Shoulder T's 3 x 10  Shoulder Scaption AROM 3 x 10      PATIENT EDUCATION: Education details: form and technique for correct performance of exercise  Person educated: Patient Education method: Explanation, Demonstration, Verbal cues, and Handouts Education comprehension: verbalized understanding, returned demonstration, and verbal cues required  HOME EXERCISE PROGRAM: Access Code: [email or web address] URL: [email or web address] Date: 07/19/2023 Prepared by: Ellin Goodie  Exercises - Seated Shoulder Flexion AAROM with Pulley Behind  - 1 x daily - 3 sets - 10 reps - Seated Shoulder Abduction AAROM with Pulley Behind  - 1 x daily - 3 sets - 10 reps - Standing Overhead Shoulder External Rotation Stretch with Towel  - 1 x daily - 3 reps - 30-60 sec hold - Standing Shoulder Internal Rotation Stretch with Towel  - 1 x daily - 3 reps - 30-60 sec  hold - Seated Upper Trapezius Stretch  - 1 x daily - 3 reps - 30 sec hold - Sidelying Shoulder External Rotation  - 3-4 x weekly - 3 sets - 10 reps - Standing Shoulder Horizontal Abduction - Palms Down  - 1 x daily - 3-4 x weekly - 3 sets - 10 reps - Standing Shoulder Flexion to 180 degrees with dumbbells   - 3-4 x weekly - 3 sets - 10 reps - Shoulder Abduction with Dumbbells - Thumbs Up  - 3-4 x weekly - 3 sets - 10 reps  ASSESSMENT:  CLINICAL IMPRESSION: Pt shows some improvement with activity tolerance with no  increase in her right shoulder pain despite progression of strengthening exercise. However, she does continue to perceive decreased right shoulder function as evidence by decrease in FOTO and show decreased shoulder internal rotation mobility and periscapular strength with increased compensation with elbow flexion with bicep activation. 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   OBJECTIVE IMPAIRMENTS: decreased ROM, decreased strength, impaired  UE functional use, and pain.   ACTIVITY LIMITATIONS: carrying, lifting, reach over head, and hygiene/grooming  PARTICIPATION LIMITATIONS: shopping and occupation  PERSONAL FACTORS: Time since onset of injury/illness/exacerbation and 1-2 comorbidities: Sinus Tachycardia   are also affecting patient's functional outcome.   REHAB POTENTIAL: Good  CLINICAL DECISION MAKING: Stable/uncomplicated  EVALUATION COMPLEXITY: Low   GOALS: Goals reviewed with patient? No  SHORT TERM GOALS: Target date: 06/30/2023  Pt will be independent with HEP in order to improve strength and balance in order to decrease fall risk and improve function at home and work. Baseline: NT 07/07/23: Performing Independently  Goal status: ACHIEVED   2.  Patient will identify UE activities at job or at home that she needs to modify to avoid overuse of right shoulder to improve RUE function  Baseline: NT  Goal status: ONGOING     LONG TERM GOALS: Target date: 08/25/2023  Patient will have improved function and activity level as evidenced by an increase in FOTO score by 10 points or more.  Baseline: 60/100 with target of 72  07/19/23: 56   Goal status: ONGOING   2.  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  Baseline: Shoulder Flex R/L 120/180, Shoulder Abd R/L 90/180 Goal status: ONGOING   3.  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  Baseline:  Shoulder Abd R/L 4-/4+, Mid Trap R/L 4/4-   Goal status: ONGOING     PLAN:  PT FREQUENCY: 1-2x/week  PT DURATION: 10 weeks  PLANNED INTERVENTIONS: Therapeutic exercises, Therapeutic activity, Neuromuscular re-education, Gait training, Patient/Family education, Self Care, Joint mobilization, Joint manipulation, Aquatic Therapy, Dry Needling, Electrical stimulation, Spinal manipulation, Spinal mobilization, Cryotherapy, Moist heat, Manual therapy, and Re-evaluation  PLAN FOR NEXT SESSION: Continue to progress shoulder and periscapular strengthening: Lower Trap Wall Setting, D2 PNF FLEXION and Extension   Ellin Goodie PT, DPT  [facility] [address] [place], [address] Phone: [phone]   Fax:  [phone]

## 2023-07-21 ENCOUNTER — Ambulatory Visit: Payer: 59 | Attending: Physician Assistant | Admitting: Physical Therapy

## 2023-07-21 ENCOUNTER — Encounter: Payer: Self-pay | Admitting: Physical Therapy

## 2023-07-21 DIAGNOSIS — M25511 Pain in right shoulder: Secondary | ICD-10-CM | POA: Diagnosis not present

## 2023-07-21 DIAGNOSIS — G8929 Other chronic pain: Secondary | ICD-10-CM | POA: Insufficient documentation

## 2023-07-21 NOTE — Therapy (Signed)
OUTPATIENT PHYSICAL THERAPY SHOULDER TREATMENT    Patient Name: Kayla Erickson MRN: [id number] DOB:[birth year], 42 y.o., female Today's Date: 07/21/2023  END OF SESSION:  PT End of Session - 07/21/23 1120     Visit Number 5    Number of Visits 20    Date for PT Re-Evaluation 08/25/23    Authorization Type [employer] 2024 30 OT/PT    Authorization - Visit Number 5    Authorization - Number of Visits 20    Progress Note Due on Visit 10    PT Start Time 1117    PT Stop Time 1200    PT Time Calculation (min) 43 min    Activity Tolerance Patient tolerated treatment well    Behavior During Therapy WFL for tasks assessed/performed               Past Medical History:  Diagnosis Date   Allergic rhinitis    Diabetes mellitus type 2 with complications (HCC)    DKA (diabetic ketoacidoses) 01/15/2017   Hypertension    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)    SINUS TACHYCARDIA 11/24/2007   Qualifier: Diagnosis of  By: Gwenlyn Perking MD, Carlos     Urine test positive for microalbuminuria 04/28/2022   Past Surgical History:  Procedure Laterality Date   WISDOM TOOTH EXTRACTION     Patient Active Problem List   Diagnosis Date Noted   Chronic pain in right shoulder 05/04/2023   Seasonal allergic rhinitis due to pollen 06/15/2022   Elevated creatine kinase 06/15/2022   Elevated liver function tests 04/28/2022   Hypomagnesemia 04/28/2022   Attention deficit hyperactivity disorder (ADHD), predominantly inattentive type 04/28/2022   Diabetic retinopathy (HCC) 03/28/2013   Diabetes mellitus type 2 with complications (HCC)    Essential hypertension 03/25/2010    PCP: Dr. Mort Sawyers   REFERRING PROVIDER: Dr. Jari Sportsman   REFERRING DIAG: [phone] (ICD-10-CM) - Chronic pain in right shoulder  THERAPY DIAG:  Chronic right shoulder pain  Rationale for Evaluation and Treatment: Rehabilitation  ONSET DATE: 12/16/2022   SUBJECTIVE:                                                                                                                                                                                       SUBJECTIVE STATEMENT: Pt states that her right shoulder continues to feel stiff but not painful  See pertinent history   Hand dominance: Right  PERTINENT HISTORY: Pt reports that her right shoulder pain started when pushing a heavy shopping cart at [employer] which was 6 mo ago. Her shoulder has since pained her. She was diagnosed with frozen shoulder from  physician and she has difficulty reaching overhead   PAIN:  Are you having pain? Yes: NPRS scale: 1/10 Pain location: Periscapular  Pain description: Achy  Aggravating factors: Reaching overhead and internally rotating arm  Relieving factors: Tylenol but not much helps. Heat helps some   PRECAUTIONS: None  RED FLAGS: None   WEIGHT BEARING RESTRICTIONS: No  FALLS:  Has patient fallen in last 6 months? 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   FINDINGS: There is no evidence of fracture or dislocation. There is no evidence of arthropathy or other focal bone abnormality. Soft tissues are unremarkable.   IMPRESSION: Negative.     Electronically Signed   By: Gerome Sam III M.D.   On: 02/04/2023 20:27    PATIENT SURVEYS:  FOTO 60 with target of 72   COGNITION: Overall cognitive status: Within functional limits for tasks assessed     SENSATION: WFL  POSTURE: No deficits   UPPER EXTREMITY ROM:   Active /Passive ROM Right eval Left eval  Shoulder flexion 120*/120* 180/180  Shoulder extension    Shoulder abduction 90*/110* 180/180  Shoulder adduction    Shoulder  internal rotation    Shoulder external rotation    Elbow flexion    Elbow extension    Wrist flexion    Wrist extension    Wrist ulnar deviation    Wrist radial deviation    Wrist pronation    Wrist supination    (Blank rows = not tested)         UPPER EXTREMITY MMT:  MMT Right eval Left eval  Shoulder flexion 4+ 4+  Shoulder extension    Shoulder abduction 4+ 4-  Shoulder adduction    Shoulder internal rotation    Shoulder external rotation    Middle trapezius 4- 4  Lower trapezius 3- 4-  Elbow flexion    Elbow extension    Wrist flexion    Wrist extension    Wrist ulnar deviation    Wrist radial deviation    Wrist pronation    Wrist supination    Grip strength (lbs)    (Blank rows = not tested)  SHOULDER SPECIAL TESTS: Impingement tests: Painful arc test: positive  and   SLAP lesions:  NT  Rotator cuff assessment: Drop arm test: negative, Empty can test: Not performed , Full can test: negative, Belly press test: positive , and Infraspinatus test: negative Biceps assessment: Speed's test: positive   JOINT MOBILITY TESTING:  NT   PALPATION:  Right supra and infraspinatus TTP    TODAY'S TREATMENT:  DATE:   07/21/23: All single UE exercises performed on RUE   THEREX:  UBE with seat at 10-2.5 min forward and 2.5 min backward  Shoulder Pulleys AAROM abduction/adduction  Shoulder Pulleys AAROM flexion/extension x 30   Shoulder AROM Measurement  Shoulder Flex R 122 Abd R 110   Shoulder AROM Wall Walks Flex 1 x 10  Shoulder AROM Wall Slides Flex 1 x 10  Shoulder AROM Wall Walks Abd 1 x 10  Shoulder AROM Wall Slides Abd 1 x 10  Shoulder Internal Rotation AAROM with pulleys 1 x 10  Shoulder External Rotation AAROM 1 x 10 with pulleys 1 x 10  OMEGA Seated Rows #25 1 x 10  OMEGA Standing Rows #5 1 x 10  -min VC to decease  speed of eccentric phase of exercise   OMEGA Standing Rows #10 1 x 10  -min VC to decease speed of eccentric phase of exercise    Shoulder horizontal abduction at 90 deg abduction 3 x 10   07/19/23:  THEREX:  UBE with seat at 10-2.5 min forward and 2.5 min backward  OMEGA Seated Row #25 1 x 10 -Pt shows increased compensation on RUE with biceps over activation  Single Arm (RUE) with water jug 1 x 10   Standing Internal Rotation Stretch on RUE 3 x 30 sec  Left Side Lying External Rotation at 0 deg abduction with #1 DB 3 x 10  FOTO: 56/100    MANUAL  Right upper trap and periscapular soft tissue massage      07/07/23: AAROM Shoulder Flex and Ext 3 x 10  AAROM Shoulder Abd and Add 3 x10 OMEGA Seated Row #25 3 x 10   OMEGA Lat Pull Downs  #10 1 x 10  -Pt compensates on RUE with increased upper trap activation  OMEGA Single UE Pull Down #5 2 x 10  Shoulder Abduction AROM to 90 deg on LUE 3 x 10  Shoulder Flexion AROM to 90 deg on LUE 3 x 10      PATIENT EDUCATION: Education details: form and technique for correct performance of exercise  Person educated: Patient Education method: Explanation, Demonstration, Verbal cues, and Handouts Education comprehension: verbalized understanding, returned demonstration, and verbal cues required  HOME EXERCISE PROGRAM: Access Code: [email or web address] URL: [email or web address] Date: 07/21/2023 Prepared by: Ellin Goodie  Exercises - Seated Upper Trapezius Stretch  - 1 x daily - 3 reps - 30 sec hold - Seated Shoulder Flexion AAROM with Pulley Behind  - 1 x daily - 3 sets - 10 reps - Seated Shoulder Abduction AAROM with Pulley Behind  - 1 x daily - 3 sets - 10 reps - Shoulder Flexion Wall Slide with Towel  - 1 x daily - 2 sets - 10 reps - 2 sec hold - Standing Shoulder Abduction Slides at Wall  - 1 x daily - 2 sets - 10 reps - 2 sec hold - Seated Shoulder Internal Rotation AAROM with Pulley  - 1 x daily - 2 sets - 10 reps -  2 sec   hold - Standing Overhead Shoulder External Rotation Stretch with Towel  - 1 x daily - 2 sets - 10 reps - 2 sec  hold - Sidelying Shoulder External Rotation  - 3-4 x weekly - 3 sets - 10 reps - Standing Shoulder Horizontal Abduction - Palms Down  - 3-4 x weekly - 3 sets - 10 reps  ASSESSMENT:  CLINICAL IMPRESSION Pt continues to be demonstrate shoulder ROM restrictions with  some improvement in baseline. She is limited in motions with traditional shoulder capsular pattern: ER, IR, and abduction. Despite shoulder deficits, pt continues to be able to perform job related duties with little difficulty. 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    OBJECTIVE IMPAIRMENTS: decreased ROM, decreased strength, impaired UE functional use, and pain.   ACTIVITY LIMITATIONS: carrying, lifting, reach over head, and hygiene/grooming  PARTICIPATION LIMITATIONS: shopping and occupation  PERSONAL FACTORS: Time since onset of injury/illness/exacerbation and 1-2 comorbidities: Sinus Tachycardia   are also affecting patient's functional outcome.   REHAB POTENTIAL: Good  CLINICAL DECISION MAKING: Stable/uncomplicated  EVALUATION COMPLEXITY: Low   GOALS: Goals reviewed with patient? No  SHORT TERM GOALS: Target date: 06/30/2023  Pt will be independent with HEP in order to improve strength and balance in order to decrease fall risk and improve function at home and work. Baseline: NT 07/07/23: Performing Independently  Goal status: ACHIEVED   2.  Patient will identify UE activities at job or at home that she needs to modify to avoid overuse of right shoulder to improve RUE function  Baseline: NT  Goal status: Deferred     LONG TERM GOALS: Target date: 08/25/2023  Patient will have  improved function and activity level as evidenced by an increase in FOTO score by 10 points or more.  Baseline: 60/100 with target of 72  07/19/23: 56   Goal status: ONGOING   2.  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  Baseline: Shoulder Flex R/L 120/180, Shoulder Abd R/L 90/180 07/21/23: Shoulder Flex R 122 Abd R 110  Goal status: ONGOING   3.  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  Baseline:  Shoulder Abd R/L 4-/4+, Mid Trap R/L 4/4-  07/21/23: Shoulder Abd R 4, Shoulder Mid Trap 4- Goal status: ONGOING     PLAN:  PT FREQUENCY: 1-2x/week  PT DURATION: 10 weeks  PLANNED INTERVENTIONS: Therapeutic exercises, Therapeutic activity, Neuromuscular re-education, Gait training, Patient/Family education, Self Care, Joint mobilization, Joint manipulation, Aquatic Therapy, Dry Needling, Electrical stimulation, Spinal manipulation, Spinal mobilization, Cryotherapy, Moist heat, Manual therapy, and Re-evaluation  PLAN FOR NEXT SESSION: Continue to progress shoulder and periscapular strengthening: Lower Trap Wall Setting, D2 PNF FLEXION and Extension   Ellin Goodie PT, DPT  [facility] [address] [place], [address] Phone: [phone]   Fax:  [phone]

## 2023-07-25 ENCOUNTER — Other Ambulatory Visit: Payer: Self-pay | Admitting: Family

## 2023-07-25 DIAGNOSIS — J301 Allergic rhinitis due to pollen: Secondary | ICD-10-CM

## 2023-07-25 DIAGNOSIS — I10 Essential (primary) hypertension: Secondary | ICD-10-CM

## 2023-07-26 ENCOUNTER — Ambulatory Visit: Payer: 59 | Admitting: Physical Therapy

## 2023-07-28 DIAGNOSIS — R809 Proteinuria, unspecified: Secondary | ICD-10-CM | POA: Diagnosis not present

## 2023-07-28 DIAGNOSIS — I10 Essential (primary) hypertension: Secondary | ICD-10-CM | POA: Diagnosis not present

## 2023-07-28 DIAGNOSIS — E1122 Type 2 diabetes mellitus with diabetic chronic kidney disease: Secondary | ICD-10-CM | POA: Diagnosis not present

## 2023-07-28 DIAGNOSIS — N1831 Chronic kidney disease, stage 3a: Secondary | ICD-10-CM | POA: Diagnosis not present

## 2023-07-29 ENCOUNTER — Ambulatory Visit: Payer: 59 | Admitting: Physical Therapy

## 2023-07-30 ENCOUNTER — Telehealth: Payer: 59 | Admitting: Family Medicine

## 2023-07-30 DIAGNOSIS — J069 Acute upper respiratory infection, unspecified: Secondary | ICD-10-CM | POA: Diagnosis not present

## 2023-07-30 MED ORDER — AZITHROMYCIN 250 MG PO TABS
ORAL_TABLET | ORAL | 0 refills | Status: AC
Start: 2023-07-30 — End: 2023-08-04

## 2023-07-30 MED ORDER — BENZONATATE 200 MG PO CAPS: 200.0000 mg | ORAL_CAPSULE | Freq: Two times a day (BID) | ORAL | 0 refills | Status: DC | PRN

## 2023-07-30 NOTE — Progress Notes (Signed)

## 2023-08-02 ENCOUNTER — Ambulatory Visit: Payer: 59 | Admitting: Physical Therapy

## 2023-08-02 ENCOUNTER — Encounter: Payer: Self-pay | Admitting: Physical Therapy

## 2023-08-02 DIAGNOSIS — G8929 Other chronic pain: Secondary | ICD-10-CM | POA: Diagnosis not present

## 2023-08-02 DIAGNOSIS — M25511 Pain in right shoulder: Secondary | ICD-10-CM | POA: Diagnosis not present

## 2023-08-02 NOTE — Therapy (Signed)
OUTPATIENT PHYSICAL THERAPY SHOULDER TREATMENT    Patient Name: Kayla Erickson MRN: [id number] DOB:03-07-1981, 42 y.o., female Today's Date: 08/02/2023  END OF SESSION:  PT End of Session - 08/02/23 1121     Visit Number 6    Number of Visits 20    Date for PT Re-Evaluation 08/25/23    Authorization Type [employer] 2024 30 OT/PT    Authorization - Visit Number 6    Authorization - Number of Visits 20    Progress Note Due on Visit 10    PT Start Time 1120    PT Stop Time 1200    PT Time Calculation (min) 40 min    Activity Tolerance Patient tolerated treatment well    Behavior During Therapy WFL for tasks assessed/performed               Past Medical History:  Diagnosis Date   Allergic rhinitis    Diabetes mellitus type 2 with complications (HCC)    DKA (diabetic ketoacidoses) 01/15/2017   Hypertension    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)    SINUS TACHYCARDIA 11/24/2007   Qualifier: Diagnosis of  By: Gwenlyn Perking MD, Carlos     Urine test positive for microalbuminuria 04/28/2022   Past Surgical History:  Procedure Laterality Date   WISDOM TOOTH EXTRACTION     Patient Active Problem List   Diagnosis Date Noted   Chronic pain in right shoulder 05/04/2023   Seasonal allergic rhinitis due to pollen 06/15/2022   Elevated creatine kinase 06/15/2022   Elevated liver function tests 04/28/2022   Hypomagnesemia 04/28/2022   Attention deficit hyperactivity disorder (ADHD), predominantly inattentive type 04/28/2022   Diabetic retinopathy (HCC) 03/28/2013   Diabetes mellitus type 2 with complications (HCC)    Essential hypertension 03/25/2010    PCP: Dr. Mort Sawyers   REFERRING PROVIDER: Dr. Jari Sportsman   REFERRING DIAG: [phone] (ICD-10-CM) - Chronic pain in right shoulder  THERAPY DIAG:  Chronic right shoulder pain  Rationale for Evaluation and Treatment: Rehabilitation  ONSET DATE: 12/16/2022   SUBJECTIVE:                                                                                                                                                                                       SUBJECTIVE STATEMENT: Pt reports that she is just getting over bronchitis so she was not able to do exercises as of late.  See pertinent history   Hand dominance: Right  PERTINENT HISTORY: Pt reports that her right shoulder pain started when pushing a heavy shopping cart at [employer] which was 6 mo ago. Her shoulder has since pained her.  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 Yes: NPRS scale: 1/10 Pain location: Periscapular  Pain description: Achy  Aggravating factors: Reaching overhead and internally rotating arm  Relieving factors: Tylenol but not much helps. Heat helps some   PRECAUTIONS: None  RED FLAGS: None   WEIGHT BEARING RESTRICTIONS: No  FALLS:  Has patient fallen in last 6 months? No    OCCUPATION: Barista at [employer]   PLOF: Independent  PATIENT GOALS:Reach up her head  NEXT MD VISIT: Not sure   OBJECTIVE:   VITALS: BP 98/66 HR 120 SpO2 100%  DIAGNOSTIC FINDINGS:  CLINICAL DATA:  Right shoulder pain   EXAM: RIGHT SHOULDER - 2+ VIEW   COMPARISON:  None Available.   FINDINGS: There is no evidence of fracture or dislocation. There is no evidence of arthropathy or other focal bone abnormality. Soft tissues are unremarkable.   IMPRESSION: Negative.     Electronically Signed   By: Gerome Sam III M.D.   On: 02/04/2023 20:27    PATIENT SURVEYS:  FOTO 60 with target of 72   COGNITION: Overall cognitive status: Within functional limits for tasks assessed     SENSATION: WFL  POSTURE: No deficits   UPPER EXTREMITY ROM:   Active /Passive ROM Right eval Left eval  Shoulder flexion 120*/120* 180/180  Shoulder extension    Shoulder abduction 90*/110* 180/180  Shoulder  adduction    Shoulder internal rotation    Shoulder external rotation    Elbow flexion    Elbow extension    Wrist flexion    Wrist extension    Wrist ulnar deviation    Wrist radial deviation    Wrist pronation    Wrist supination    (Blank rows = not tested)         UPPER EXTREMITY MMT:  MMT Right eval Left eval  Shoulder flexion 4+ 4+  Shoulder extension    Shoulder abduction 4+ 4-  Shoulder adduction    Shoulder internal rotation    Shoulder external rotation    Middle trapezius 4- 4  Lower trapezius 3- 4-  Elbow flexion    Elbow extension    Wrist flexion    Wrist extension    Wrist ulnar deviation    Wrist radial deviation    Wrist pronation    Wrist supination    Grip strength (lbs)    (Blank rows = not tested)  SHOULDER SPECIAL TESTS: Impingement tests: Painful arc test: positive  and   SLAP lesions:  NT  Rotator cuff assessment: Drop arm test: negative, Empty can test: Not performed , Full can test: negative, Belly press test: positive , and Infraspinatus test: negative Biceps assessment: Speed's test: positive   JOINT MOBILITY TESTING:  NT   PALPATION:  Right supra and infraspinatus TTP    TODAY'S TREATMENT:  DATE:   08/02/23: All single leg exercises performed on RUE   THEREX:  UBE with seat at 10-3 min forward and 3 min backward with resistance of 2  OMEGA Single Arm Rows with #5 2 x 10  OMEGA Single Arm Rows with yellow band 1 x 10  AAROM Abduction Shoulder Pulleys x 30  AROM Shoulder Abductions wall slides 3 x 10  Shoulder Abduction with resistance and extended elbow and yellow band 1 x 10  -Pt unable to achieve full 90 deg abduction  Shoulder Abduction with #1 DB 2 x 10  -min VC to flex elbow to allow for more shoulder abduction.  -visual input with mirror to provide patient with visual  cues Shoulder Flexion with #1 DB 3 x 10  -visual input with mirror to provide patient with visual cues Shoulder ER with RUE at 90 deg abduction 1 x 10  -Pt unable to keep elbow from flexing  Shoulder ER AAROM with RUE at 90 deg abduction 1 x 10   07/21/23: All single UE exercises performed on RUE   THEREX:  UBE with seat at 10-2.5 min forward and 2.5 min backward  Shoulder Pulleys AAROM abduction/adduction  Shoulder Pulleys AAROM flexion/extension x 30   Shoulder AROM Measurement  Shoulder Flex R 122 Abd R 110   Shoulder AROM Wall Walks Flex 1 x 10  Shoulder AROM Wall Slides Flex 1 x 10  Shoulder AROM Wall Walks Abd 1 x 10  Shoulder AROM Wall Slides Abd 1 x 10  Shoulder Internal Rotation AAROM with pulleys 1 x 10  Shoulder External Rotation AAROM 1 x 10 with pulleys 1 x 10  OMEGA Seated Rows #25 1 x 10  OMEGA Standing Rows #5 1 x 10  -min VC to decease speed of eccentric phase of exercise   OMEGA Standing Rows #10 1 x 10  -min VC to decease speed of eccentric phase of exercise    Shoulder horizontal abduction at 90 deg abduction 3 x 10   07/19/23:  THEREX:  UBE with seat at 10-2.5 min forward and 2.5 min backward  OMEGA Seated Row #25 1 x 10 -Pt shows increased compensation on RUE with biceps over activation  Single Arm (RUE) with water jug 1 x 10   Standing Internal Rotation Stretch on RUE 3 x 30 sec  Left Side Lying External Rotation at 0 deg abduction with #1 DB 3 x 10  FOTO: 56/100    MANUAL  Right upper trap and periscapular soft tissue massage      07/07/23: AAROM Shoulder Flex and Ext 3 x 10  AAROM Shoulder Abd and Add 3 x10 OMEGA Seated Row #25 3 x 10   OMEGA Lat Pull Downs  #10 1 x 10  -Pt compensates on RUE with increased upper trap activation  OMEGA Single UE Pull Down #5 2 x 10  Shoulder Abduction AROM to 90 deg on LUE 3 x 10  Shoulder Flexion AROM to 90 deg on LUE 3 x 10      PATIENT EDUCATION: Education details: form and technique for correct  performance of exercise  Person educated: Patient Education method: Explanation, Demonstration, Verbal cues, and Handouts Education comprehension: verbalized understanding, returned demonstration, and verbal cues required  HOME EXERCISE PROGRAM: Access Code: [email or web address] URL: [email or web address] Date: 08/02/2023 Prepared by: Ellin Goodie  Exercises - Seated Upper Trapezius Stretch  - 1 x daily - 3 reps - 30 sec hold - Seated Shoulder Flexion AAROM with Pulley Behind  -  1 x daily - 3 sets - 10 reps - Seated Shoulder Abduction AAROM with Pulley Behind  - 1 x daily - 3 sets - 10 reps - Shoulder Flexion Wall Slide with Towel  - 1 x daily - 2 sets - 10 reps - 2 sec hold - Standing Shoulder Abduction Slides at Wall  - 1 x daily - 2 sets - 10 reps - 2 sec hold - Seated Shoulder Internal Rotation AAROM with Pulley  - 1 x daily - 2 sets - 10 reps -  2 sec  hold - Standing Overhead Shoulder External Rotation Stretch with Towel  - 1 x daily - 2 sets - 10 reps - 2 sec  hold - Standing Shoulder Flexion to 180 degrees with dumbbells   - 3-4 x weekly - 3 sets - 10 reps - Shoulder Abduction with Dumbbells - Thumbs Up  - 3-4 x weekly - 3 sets - 10 reps - Sidelying Shoulder External Rotation  - 3-4 x weekly - 3 sets - 10 reps - Standing Shoulder Horizontal Abduction - Palms Down  - 3-4 x weekly - 3 sets - 10 reps  ASSESSMENT:  CLINICAL IMPRESSION  Pt shows improvement with shoulder strength with ability to perform abduction and flexion shoulder resistance exercises. She does require mod VC as well as visual input to decrease compensation with over activation of upper trap. Pt is still very limited with shoulder external rotation AROM at 90 deg requiring AAROM to increase ROM.  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  OBJECTIVE IMPAIRMENTS: decreased ROM, decreased strength, impaired UE functional use, and pain.   ACTIVITY LIMITATIONS: carrying, lifting, reach over head, and hygiene/grooming  PARTICIPATION LIMITATIONS: shopping and occupation  PERSONAL FACTORS: Time since onset of injury/illness/exacerbation and 1-2 comorbidities: Sinus Tachycardia   are also affecting patient's functional outcome.   REHAB POTENTIAL: Good  CLINICAL DECISION MAKING: Stable/uncomplicated  EVALUATION COMPLEXITY: Low   GOALS: Goals reviewed with patient? No  SHORT TERM GOALS: Target date: 06/30/2023  Pt will be independent with HEP in order to improve strength and balance in order to decrease fall risk and improve function at home and work. Baseline: NT 07/07/23: Performing Independently  Goal status: ACHIEVED   2.  Patient will identify UE activities at job or at home that she needs to modify to avoid overuse of right shoulder to improve RUE function  Baseline: NT  Goal status: Deferred     LONG TERM GOALS: Target date: 08/25/2023  Patient will have improved function and activity level as evidenced by an increase in FOTO score by 10 points or more.  Baseline: 60/100 with target of 72  07/19/23: 56   Goal status: ONGOING   2.  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  Baseline: Shoulder Flex R/L 120/180, Shoulder Abd R/L 90/180 07/21/23: Shoulder Flex R 122 Abd R 110  Goal status: ONGOING   3.  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  Baseline:  Shoulder Abd R/L 4-/4+, Mid Trap R/L 4/4-  07/21/23: Shoulder Abd R 4, Shoulder Mid Trap  4- Goal status: ONGOING     PLAN:  PT FREQUENCY: 1-2x/week  PT DURATION: 10 weeks  PLANNED INTERVENTIONS: Therapeutic exercises, Therapeutic activity, Neuromuscular re-education, Gait training, Patient/Family education, Self Care, Joint mobilization, Joint manipulation, Aquatic Therapy, Dry Needling, Electrical stimulation, Spinal manipulation, Spinal mobilization, Cryotherapy, Moist heat, Manual therapy, and Re-evaluation  PLAN FOR NEXT SESSION: Focus on shoulder IR and ER AAROM at 90 deg abduction AAROM  and try supine . Continue to progress shoulder and periscapular strengthening: Lower Trap Wall Setting, D2 PNF FLEXION and Extension   Ellin Goodie PT, DPT  [facility] [address] [place], [address] Phone: [phone]   Fax:  [phone]

## 2023-08-05 ENCOUNTER — Ambulatory Visit: Payer: 59 | Admitting: Physical Therapy

## 2023-08-05 DIAGNOSIS — G8929 Other chronic pain: Secondary | ICD-10-CM

## 2023-08-05 DIAGNOSIS — M25511 Pain in right shoulder: Secondary | ICD-10-CM | POA: Diagnosis not present

## 2023-08-05 NOTE — Therapy (Signed)
OUTPATIENT PHYSICAL THERAPY SHOULDER TREATMENT    Patient Name: Kayla Erickson MRN: [id number] DOB:1981-03-29, 42 y.o., female Today's Date: 08/05/2023  END OF SESSION:  PT End of Session - 08/05/23 1122     Visit Number 7    Number of Visits 20    Date for PT Re-Evaluation 08/25/23    Authorization Type [employer] 2024 30 OT/PT    Authorization - Visit Number 7    Authorization - Number of Visits 20    Progress Note Due on Visit 10    PT Start Time 1120    PT Stop Time 1200    PT Time Calculation (min) 40 min    Activity Tolerance Patient tolerated treatment well    Behavior During Therapy WFL for tasks assessed/performed               Past Medical History:  Diagnosis Date   Allergic rhinitis    Diabetes mellitus type 2 with complications (HCC)    DKA (diabetic ketoacidoses) 01/15/2017   Hypertension    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)    SINUS TACHYCARDIA 11/24/2007   Qualifier: Diagnosis of  By: Gwenlyn Perking MD, Carlos     Urine test positive for microalbuminuria 04/28/2022   Past Surgical History:  Procedure Laterality Date   WISDOM TOOTH EXTRACTION     Patient Active Problem List   Diagnosis Date Noted   Chronic pain in right shoulder 05/04/2023   Seasonal allergic rhinitis due to pollen 06/15/2022   Elevated creatine kinase 06/15/2022   Elevated liver function tests 04/28/2022   Hypomagnesemia 04/28/2022   Attention deficit hyperactivity disorder (ADHD), predominantly inattentive type 04/28/2022   Diabetic retinopathy (HCC) 03/28/2013   Diabetes mellitus type 2 with complications (HCC)    Essential hypertension 03/25/2010    PCP: Dr. Mort Sawyers   REFERRING PROVIDER: Dr. Jari Sportsman   REFERRING DIAG: [id number] (ICD-10-CM) - Chronic pain in right shoulder  THERAPY DIAG:  Chronic right shoulder pain  Rationale for Evaluation and Treatment: Rehabilitation  ONSET DATE: 12/16/2022   SUBJECTIVE:                                                                                                                                                                                       SUBJECTIVE STATEMENT: Pt states she continues to feel stiffness in shoulder but no pain.  See pertinent history   Hand dominance: Right  PERTINENT HISTORY: Pt reports that her right shoulder pain started when pushing a heavy shopping cart at [employer] which was 6 mo ago. Her shoulder has since pained her. 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 Yes: NPRS scale: 1/10 Pain location: Periscapular  Pain description: Achy  Aggravating factors: Reaching overhead and internally rotating arm  Relieving factors: Tylenol but not much helps. Heat helps some   PRECAUTIONS: None  RED FLAGS: None   WEIGHT BEARING RESTRICTIONS: No  FALLS:  Has patient fallen in last 6 months? 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   FINDINGS: There is no evidence of fracture or dislocation. There is no evidence of arthropathy or other focal bone abnormality. Soft tissues are unremarkable.   IMPRESSION: Negative.     Electronically Signed   By: Gerome Sam III M.D.   On: 02/04/2023 20:27    PATIENT SURVEYS:  FOTO 60 with target of 72   COGNITION: Overall cognitive status: Within functional limits for tasks assessed     SENSATION: WFL  POSTURE: No deficits   UPPER EXTREMITY ROM:   Active /Passive ROM Right eval Left eval  Shoulder flexion 120*/120* 180/180  Shoulder extension    Shoulder abduction 90*/110* 180/180  Shoulder adduction    Shoulder internal rotation     Shoulder external rotation    Elbow flexion    Elbow extension    Wrist flexion    Wrist extension    Wrist ulnar deviation    Wrist radial deviation    Wrist pronation    Wrist supination    (Blank rows = not tested)         UPPER EXTREMITY MMT:  MMT Right eval Left eval  Shoulder flexion 4+ 4+  Shoulder extension    Shoulder abduction 4+ 4-  Shoulder adduction    Shoulder internal rotation    Shoulder external rotation    Middle trapezius 4- 4  Lower trapezius 3- 4-  Elbow flexion    Elbow extension    Wrist flexion    Wrist extension    Wrist ulnar deviation    Wrist radial deviation    Wrist pronation    Wrist supination    Grip strength (lbs)    (Blank rows = not tested)  SHOULDER SPECIAL TESTS: Impingement tests: Painful arc test: positive  and   SLAP lesions:  NT  Rotator cuff assessment: Drop arm test: negative, Empty can test: Not performed , Full can test: negative, Belly press test: positive , and Infraspinatus test: negative Biceps assessment: Speed's test: positive   JOINT MOBILITY TESTING:  NT   PALPATION:  Right supra and infraspinatus TTP    TODAY'S TREATMENT:  DATE:   08/05/23: UBE Seat 9 for resistance of 2 -2.5 Forward and 2.5 Backward  OMEGA Standing Rows #15 1 x 10  Seated Shoulder Abduction AROM with silver ball 1 x 10  Seated Shoulder Flexion AROM with silver ball 1 x 10  Supine Shoulder AAROM ER/IR at 90 deg 1 x 10  Supine Shoulder IR/ER AAROM at 90 deg 3 x 10      08/02/23: All single leg exercises performed on RUE   THEREX:  UBE with seat at 10-3 min forward and 3 min backward with resistance of 2  OMEGA Single Arm Rows with #5 2 x 10  OMEGA Single Arm Rows with yellow band 1 x 10  AAROM Abduction Shoulder Pulleys x 30  AROM Shoulder Abductions wall slides 3 x 10  Shoulder Abduction  with resistance and extended elbow and yellow band 1 x 10  -Pt unable to achieve full 90 deg abduction  Shoulder Abduction with #1 DB 2 x 10  -min VC to flex elbow to allow for more shoulder abduction.  -visual input with mirror to provide patient with visual cues Shoulder Flexion with #1 DB 3 x 10  -visual input with mirror to provide patient with visual cues Shoulder ER with RUE at 90 deg abduction 1 x 10  -Pt unable to keep elbow from flexing  Shoulder ER AAROM with RUE at 90 deg abduction 1 x 10   07/21/23: All single UE exercises performed on RUE   THEREX:  UBE with seat at 10-2.5 min forward and 2.5 min backward  Shoulder Pulleys AAROM abduction/adduction  Shoulder Pulleys AAROM flexion/extension x 30   Shoulder AROM Measurement  Shoulder Flex R 122 Abd R 110   Shoulder AROM Wall Walks Flex 1 x 10  Shoulder AROM Wall Slides Flex 1 x 10  Shoulder AROM Wall Walks Abd 1 x 10  Shoulder AROM Wall Slides Abd 1 x 10  Shoulder Internal Rotation AAROM with pulleys 1 x 10  Shoulder External Rotation AAROM 1 x 10 with pulleys 1 x 10  OMEGA Seated Rows #25 1 x 10  OMEGA Standing Rows #5 1 x 10  -min VC to decease speed of eccentric phase of exercise   OMEGA Standing Rows #10 1 x 10  -min VC to decease speed of eccentric phase of exercise    Shoulder horizontal abduction at 90 deg abduction 3 x 10   07/19/23:  THEREX:  UBE with seat at 10-2.5 min forward and 2.5 min backward  OMEGA Seated Row #25 1 x 10 -Pt shows increased compensation on RUE with biceps over activation  Single Arm (RUE) with water jug 1 x 10   Standing Internal Rotation Stretch on RUE 3 x 30 sec  Left Side Lying External Rotation at 0 deg abduction with #1 DB 3 x 10  FOTO: 56/100    MANUAL  Right upper trap and periscapular soft tissue massage      PATIENT EDUCATION: Education details: form and technique for correct performance of exercise  Person educated: Patient Education method: Explanation,  Demonstration, Verbal cues, and Handouts Education comprehension: verbalized understanding, returned demonstration, and verbal cues required  HOME EXERCISE PROGRAM: Access Code: [email or web address] URL: [email or web address] Date: 08/05/2023 Prepared by: Ellin Goodie  Exercises - Seated Upper Trapezius Stretch  - 1 x daily - 3 reps - 30 sec hold - Seated Shoulder Flexion AAROM with Pulley Behind  - 1 x daily - 3 sets - 10 reps - Seated Shoulder Abduction  AAROM with Pulley Behind  - 1 x daily - 3 sets - 10 reps - Shoulder Flexion Wall Slide with Towel  - 1 x daily - 2 sets - 10 reps - 2 sec hold - Standing Shoulder Abduction Slides at Wall  - 1 x daily - 2 sets - 10 reps - 2 sec hold - Seated Shoulder Internal Rotation AAROM with Pulley  - 1 x daily - 2 sets - 10 reps -  2 sec  hold - Standing Overhead Shoulder External Rotation Stretch with Towel  - 1 x daily - 2 sets - 10 reps - 2 sec  hold - Standing Shoulder Flexion to 180 degrees with dumbbells   - 3-4 x weekly - 3 sets - 10 reps - Shoulder Abduction with Dumbbells - Thumbs Up  - 3-4 x weekly - 3 sets - 10 reps - Sidelying Shoulder External Rotation  - 3-4 x weekly - 3 sets - 10 reps - Standing Shoulder Horizontal Abduction - Palms Down  - 3-4 x weekly - 3 sets - 10 reps - Supine Shoulder External Rotation in 45 Degrees Abduction AAROM with Dowel  - 1 x daily - 3 sets - 10 reps   ASSESSMENT:  CLINICAL IMPRESSION    Pt continues to remain in the stage 2 or frozen stage. She continues to be limited with her ROM and experiences ongoing stiffness. Continued to progress shoulder ROM with focus on limited ROM.  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   OBJECTIVE IMPAIRMENTS: decreased ROM, decreased  strength, impaired UE functional use, and pain.   ACTIVITY LIMITATIONS: carrying, lifting, reach over head, and hygiene/grooming  PARTICIPATION LIMITATIONS: shopping and occupation  PERSONAL FACTORS: Time since onset of injury/illness/exacerbation and 1-2 comorbidities: Sinus Tachycardia   are also affecting patient's functional outcome.   REHAB POTENTIAL: Good  CLINICAL DECISION MAKING: Stable/uncomplicated  EVALUATION COMPLEXITY: Low   GOALS: Goals reviewed with patient? No  SHORT TERM GOALS: Target date: 06/30/2023  Pt will be independent with HEP in order to improve strength and balance in order to decrease fall risk and improve function at home and work. Baseline: NT 07/07/23: Performing Independently  Goal status: ACHIEVED   2.  Patient will identify UE activities at job or at home that she needs to modify to avoid overuse of right shoulder to improve RUE function  Baseline: NT 08/05/23: Able to perform independently  Goal status: ACHIEVED     LONG TERM GOALS: Target date: 08/25/2023  Patient will have improved function and activity level as evidenced by an increase in FOTO score by 10 points or more.  Baseline: 60/100 with target of 72  07/19/23: 56   Goal status: ONGOING   2.  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  Baseline: Shoulder Flex R/L 120/180, Shoulder Abd R/L 90/180 07/21/23: Shoulder Flex R 122 Abd R 110  Goal status: ONGOING   3.  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  Baseline:  Shoulder Abd R/L 4-/4+, Mid Trap R/L 4/4-  07/21/23: Shoulder Abd R 4, Shoulder Mid Trap 4- Goal status: ONGOING     PLAN:  PT FREQUENCY:  1-2x/week  PT DURATION: 10 weeks  PLANNED INTERVENTIONS: Therapeutic exercises, Therapeutic activity, Neuromuscular re-education, Gait training, Patient/Family education, Self Care, Joint mobilization, Joint manipulation, Aquatic Therapy, Dry Needling, Electrical stimulation, Spinal manipulation, Spinal mobilization, Cryotherapy, Moist heat, Manual therapy, and Re-evaluation  PLAN FOR NEXT SESSION: Focus on shoulder IR and ER AAROM at 0 deg abduction Continue to progress shoulder and periscapular strengthening: Lower Trap Wall Setting, D2 PNF FLEXION and Extension   Ellin Goodie PT, DPT  [facility] [address] [place], [address] Phone: [phone]   Fax:  [phone]

## 2023-08-09 ENCOUNTER — Ambulatory Visit: Payer: 59 | Admitting: Physical Therapy

## 2023-08-12 ENCOUNTER — Ambulatory Visit: Payer: 59 | Admitting: Physical Therapy

## 2023-08-12 DIAGNOSIS — G8929 Other chronic pain: Secondary | ICD-10-CM | POA: Diagnosis not present

## 2023-08-12 DIAGNOSIS — M25511 Pain in right shoulder: Secondary | ICD-10-CM | POA: Diagnosis not present

## 2023-08-12 NOTE — Therapy (Signed)
OUTPATIENT PHYSICAL THERAPY SHOULDER TREATMENT    Patient Name: Kayla Erickson MRN: [id number] DOB:02-Oct-1981, 42 y.o., female Today's Date: 08/12/2023  END OF SESSION:  PT End of Session - 08/12/23 1124     Visit Number 8    Number of Visits 20    Date for PT Re-Evaluation 08/25/23    Authorization Type [employer] 2024 30 OT/PT    Authorization - Visit Number 8    Authorization - Number of Visits 20    Progress Note Due on Visit 10    PT Start Time 1120    PT Stop Time 1200    PT Time Calculation (min) 40 min    Activity Tolerance Patient tolerated treatment well    Behavior During Therapy WFL for tasks assessed/performed               Past Medical History:  Diagnosis Date   Allergic rhinitis    Diabetes mellitus type 2 with complications (HCC)    DKA (diabetic ketoacidoses) 01/15/2017   Hypertension    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)    SINUS TACHYCARDIA 11/24/2007   Qualifier: Diagnosis of  By: Gwenlyn Perking MD, Carlos     Urine test positive for microalbuminuria 04/28/2022   Past Surgical History:  Procedure Laterality Date   WISDOM TOOTH EXTRACTION     Patient Active Problem List   Diagnosis Date Noted   Chronic pain in right shoulder 05/04/2023   Seasonal allergic rhinitis due to pollen 06/15/2022   Elevated creatine kinase 06/15/2022   Elevated liver function tests 04/28/2022   Hypomagnesemia 04/28/2022   Attention deficit hyperactivity disorder (ADHD), predominantly inattentive type 04/28/2022   Diabetic retinopathy (HCC) 03/28/2013   Diabetes mellitus type 2 with complications (HCC)    Essential hypertension 03/25/2010    PCP: Dr. Mort Sawyers   REFERRING PROVIDER: Dr. Jari Sportsman   REFERRING DIAG: [phone] (ICD-10-CM) - Chronic pain in right shoulder  THERAPY DIAG:  Chronic right shoulder pain  Rationale for Evaluation and Treatment: Rehabilitation  ONSET DATE: 12/16/2022   SUBJECTIVE:                                                                                                                                                                                       SUBJECTIVE STATEMENT: Pt reports that her shoulder continues to feel ok. Her right shoulder still continues to feel stiff especially in the morning.  See pertinent history   Hand dominance: Right  PERTINENT HISTORY: Pt reports that her right shoulder pain started when pushing a heavy shopping cart at [employer] which was 6 mo ago. Her shoulder has since pained  her. 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 Yes: NPRS scale: 1/10 Pain location: Periscapular  Pain description: Achy  Aggravating factors: Reaching overhead and internally rotating arm  Relieving factors: Tylenol but not much helps. Heat helps some   PRECAUTIONS: None  RED FLAGS: None   WEIGHT BEARING RESTRICTIONS: No  FALLS:  Has patient fallen in last 6 months? No  OCCUPATION: Barista at [employer]   PLOF: Independent  PATIENT GOALS:Reach up her head  NEXT MD VISIT: Not sure   OBJECTIVE:   VITALS: BP 98/66 HR 120 SpO2 100%  DIAGNOSTIC FINDINGS:  CLINICAL DATA:  Right shoulder pain   EXAM: RIGHT SHOULDER - 2+ VIEW   COMPARISON:  None Available.   FINDINGS: There is no evidence of fracture or dislocation. There is no evidence of arthropathy or other focal bone abnormality. Soft tissues are unremarkable.   IMPRESSION: Negative.     Electronically Signed   By: Gerome Sam III M.D.   On: 02/04/2023 20:27    PATIENT SURVEYS:  FOTO 60 with target of 72   COGNITION: Overall cognitive status: Within functional limits for tasks assessed     SENSATION: WFL  POSTURE: No deficits   UPPER EXTREMITY ROM:   Active /Passive ROM Right eval Left eval  Shoulder flexion 120*/120* 180/180  Shoulder extension    Shoulder abduction 90*/110*  180/180  Shoulder adduction    Shoulder internal rotation    Shoulder external rotation    Elbow flexion    Elbow extension    Wrist flexion    Wrist extension    Wrist ulnar deviation    Wrist radial deviation    Wrist pronation    Wrist supination    (Blank rows = not tested)         UPPER EXTREMITY MMT:  MMT Right eval Left eval  Shoulder flexion 4+ 4+  Shoulder extension    Shoulder abduction 4+ 4-  Shoulder adduction    Shoulder internal rotation    Shoulder external rotation    Middle trapezius 4- 4  Lower trapezius 3- 4-  Elbow flexion    Elbow extension    Wrist flexion    Wrist extension    Wrist ulnar deviation    Wrist radial deviation    Wrist pronation    Wrist supination    Grip strength (lbs)    (Blank rows = not tested)  SHOULDER SPECIAL TESTS: Impingement tests: Painful arc test: positive  and   SLAP lesions:  NT  Rotator cuff assessment: Drop arm test: negative, Empty can test: Not performed , Full can test: negative, Belly press test: positive , and Infraspinatus test: negative Biceps assessment: Speed's test: positive   JOINT MOBILITY TESTING:  NT   PALPATION:  Right supra and infraspinatus TTP    TODAY'S TREATMENT:  DATE:   08/12/23: THEREX UBE Seat 9 for resistance of 2 -2.5 Forward and 2.5 Backward  Supine Shoulder Flexion and Extension AAROM with wand and #5 AW 1 x 10  Supine Shoulder Flexion AROM with #3 DB 1 x 10  Palpation:  -Right lat increased tension  -Right pec TTP  Left side lying should abduction with #3 AW 1 x 10  -Reaches 90 degrees AROM     08/05/23: UBE Seat 9 for resistance of 2 -2.5 Forward and 2.5 Backward  OMEGA Standing Rows #15 1 x 10  Seated Shoulder Abduction AROM with silver ball 1 x 10  Seated Shoulder Flexion AROM with silver ball 1 x 10  Supine Shoulder AAROM  ER/IR at 90 deg 1 x 10  Supine Shoulder IR/ER AAROM at 90 deg 3 x 10      08/02/23: All single leg exercises performed on RUE   THEREX:  UBE with seat at 10-3 min forward and 3 min backward with resistance of 2  OMEGA Single Arm Rows with #5 2 x 10  OMEGA Single Arm Rows with yellow band 1 x 10  AAROM Abduction Shoulder Pulleys x 30  AROM Shoulder Abductions wall slides 3 x 10  Shoulder Abduction with resistance and extended elbow and yellow band 1 x 10  -Pt unable to achieve full 90 deg abduction  Shoulder Abduction with #1 DB 2 x 10  -min VC to flex elbow to allow for more shoulder abduction.  -visual input with mirror to provide patient with visual cues Shoulder Flexion with #1 DB 3 x 10  -visual input with mirror to provide patient with visual cues Shoulder ER with RUE at 90 deg abduction 1 x 10  -Pt unable to keep elbow from flexing  Shoulder ER AAROM with RUE at 90 deg abduction 1 x 10   07/21/23: All single UE exercises performed on RUE   THEREX:  UBE with seat at 10-2.5 min forward and 2.5 min backward  Shoulder Pulleys AAROM abduction/adduction  Shoulder Pulleys AAROM flexion/extension x 30   Shoulder AROM Measurement  Shoulder Flex R 122 Abd R 110   Shoulder AROM Wall Walks Flex 1 x 10  Shoulder AROM Wall Slides Flex 1 x 10  Shoulder AROM Wall Walks Abd 1 x 10  Shoulder AROM Wall Slides Abd 1 x 10  Shoulder Internal Rotation AAROM with pulleys 1 x 10  Shoulder External Rotation AAROM 1 x 10 with pulleys 1 x 10  OMEGA Seated Rows #25 1 x 10  OMEGA Standing Rows #5 1 x 10  -min VC to decease speed of eccentric phase of exercise   OMEGA Standing Rows #10 1 x 10  -min VC to decease speed of eccentric phase of exercise    Shoulder horizontal abduction at 90 deg abduction 3 x 10   07/19/23:  THEREX:  UBE with seat at 10-2.5 min forward and 2.5 min backward  OMEGA Seated Row #25 1 x 10 -Pt shows increased compensation on RUE with biceps over activation  Single  Arm (RUE) with water jug 1 x 10   Standing Internal Rotation Stretch on RUE 3 x 30 sec  Left Side Lying External Rotation at 0 deg abduction with #1 DB 3 x 10  FOTO: 56/100    MANUAL  Right upper trap and periscapular soft tissue massage      PATIENT EDUCATION: Education details: form and technique for correct performance of exercise  Person educated: Patient Education method: Explanation,  Demonstration, Verbal cues, and Handouts Education comprehension: verbalized understanding, returned demonstration, and verbal cues required  HOME EXERCISE PROGRAM: Access Code: [email or web address] URL: [email or web address] Date: 08/12/2023 Prepared by: Ellin Goodie  Exercises - Seated Upper Trapezius Stretch  - 1 x daily - 3 reps - 30 sec hold - Prone Chest Stretch on Chair  - 2-3 x weekly - 3 reps - 30 sec  hold - Seated Shoulder Flexion AAROM with Pulley Behind  - 1 x daily - 3 sets - 10 reps - Seated Shoulder Abduction AAROM with Pulley Behind  - 1 x daily - 3 sets - 10 reps - Seated Shoulder Internal Rotation AAROM with Pulley  - 1 x daily - 2 sets - 10 reps -  2 sec  hold - Standing Overhead Shoulder External Rotation Stretch with Towel  - 1 x daily - 2 sets - 10 reps - 2 sec  hold - Scare the Bear   - 1 x weekly - 3 sets - 10 reps - Sidelying Shoulder Abduction Palm Forward  - 1 x daily - 3 sets - 10 reps   ASSESSMENT:  CLINICAL IMPRESSION   Despite previous report, pt appears to be more within the thawing phase or phase 3 of frozen shoulder with little to no pain and ROM that is slowly returning. She is now able to flex right shoulder to 100 degrees and abduct to 90 degrees, but then experiences restriction. Continued to focus on ROM exercises to improved right shoulder ROM. 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    OBJECTIVE IMPAIRMENTS: decreased ROM, decreased strength, impaired UE functional use, and pain.   ACTIVITY LIMITATIONS: carrying, lifting, reach over head, and hygiene/grooming  PARTICIPATION LIMITATIONS: shopping and occupation  PERSONAL FACTORS: Time since onset of injury/illness/exacerbation and 1-2 comorbidities: Sinus Tachycardia   are also affecting patient's functional outcome.   REHAB POTENTIAL: Good  CLINICAL DECISION MAKING: Stable/uncomplicated  EVALUATION COMPLEXITY: Low   GOALS: Goals reviewed with patient? No  SHORT TERM GOALS: Target date: 06/30/2023  Pt will be independent with HEP in order to improve strength and balance in order to decrease fall risk and improve function at home and work. Baseline: NT 07/07/23: Performing Independently  Goal status: ACHIEVED   2.  Patient will identify UE activities at job or at home that she needs to modify to avoid overuse of right shoulder to improve RUE function  Baseline: NT 08/05/23: Able to perform independently  Goal status: ACHIEVED     LONG TERM GOALS: Target date: 08/25/2023  Patient will have improved function and activity level as evidenced by an increase in FOTO score by 10 points or more.  Baseline: 60/100 with target of 72  07/19/23: 56   Goal status: ONGOING   2.  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  Baseline: Shoulder Flex R/L 120/180, Shoulder Abd R/L 90/180 07/21/23: Shoulder Flex R 122 Abd R 110  Goal status: ONGOING   3.  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  Baseline:  Shoulder Abd R/L 4-/4+, Mid Trap  R/L 4/4-  07/21/23: Shoulder Abd R 4, Shoulder Mid Trap 4- Goal status: ONGOING     PLAN:  PT FREQUENCY: 1-2x/week  PT DURATION: 10 weeks  PLANNED INTERVENTIONS: Therapeutic exercises, Therapeutic activity, Neuromuscular re-education, Gait training, Patient/Family education, Self Care, Joint mobilization, Joint manipulation, Aquatic Therapy, Dry Needling, Electrical stimulation, Spinal manipulation, Spinal mobilization, Cryotherapy, Moist heat, Manual therapy, and Re-evaluation  PLAN FOR NEXT SESSION: Dry needling of latissimus dorsi and pec. Continue to progress shoulder ROM exercises:  Continue to progress shoulder and periscapular strengthening: Lower Trap Wall Setting, D2 PNF FLEXION and Extension   Ellin Goodie PT, DPT  [facility] [address], [place], [address] Phone: [phone]   Fax:  [phone]

## 2023-08-16 ENCOUNTER — Encounter: Payer: Self-pay | Admitting: Physical Therapy

## 2023-08-16 ENCOUNTER — Ambulatory Visit: Payer: 59

## 2023-08-16 DIAGNOSIS — G8929 Other chronic pain: Secondary | ICD-10-CM

## 2023-08-16 DIAGNOSIS — M25511 Pain in right shoulder: Secondary | ICD-10-CM | POA: Diagnosis not present

## 2023-08-16 NOTE — Therapy (Signed)
OUTPATIENT PHYSICAL THERAPY SHOULDER TREATMENT    Patient Name: Kayla Erickson MRN: [id number] DOB:[birth year], 42 y.o., female Today's Date: 08/16/2023  END OF SESSION:  PT End of Session - 08/16/23 1115     Visit Number 9    Number of Visits 20    Date for PT Re-Evaluation 08/25/23    Authorization Type [employer] 2024 30 OT/PT    Authorization - Visit Number 9    Authorization - Number of Visits 20    Progress Note Due on Visit 10    PT Start Time 1115    PT Stop Time 1157    PT Time Calculation (min) 42 min    Activity Tolerance Patient tolerated treatment well    Behavior During Therapy WFL for tasks assessed/performed                Past Medical History:  Diagnosis Date   Allergic rhinitis    Diabetes mellitus type 2 with complications (HCC)    DKA (diabetic ketoacidoses) 01/15/2017   Hypertension    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)    SINUS TACHYCARDIA 11/24/2007   Qualifier: Diagnosis of  By: Gwenlyn Perking MD, Carlos     Urine test positive for microalbuminuria 04/28/2022   Past Surgical History:  Procedure Laterality Date   WISDOM TOOTH EXTRACTION     Patient Active Problem List   Diagnosis Date Noted   Chronic pain in right shoulder 05/04/2023   Seasonal allergic rhinitis due to pollen 06/15/2022   Elevated creatine kinase 06/15/2022   Elevated liver function tests 04/28/2022   Hypomagnesemia 04/28/2022   Attention deficit hyperactivity disorder (ADHD), predominantly inattentive type 04/28/2022   Diabetic retinopathy (HCC) 03/28/2013   Diabetes mellitus type 2 with complications (HCC)    Essential hypertension 03/25/2010    PCP: Dr. Mort Sawyers   REFERRING PROVIDER: Dr. Jari Sportsman   REFERRING DIAG: [phone] (ICD-10-CM) - Chronic pain in right shoulder  THERAPY DIAG:  Chronic right shoulder pain  Rationale for Evaluation and Treatment: Rehabilitation  ONSET DATE: 12/16/2022   SUBJECTIVE:                                                                                                                                                                                       SUBJECTIVE STATEMENT:  Patient reports R shoulder feels stiffness this AM. 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   Hand dominance: Right  PERTINENT HISTORY: Pt reports that her right shoulder pain started when pushing a heavy shopping cart at [employer] which was 6 mo ago. Her shoulder has since pained her. 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 Yes: NPRS scale: 1/10 Pain location: Periscapular  Pain description: Achy  Aggravating factors: Reaching overhead and internally rotating arm  Relieving factors: Tylenol but not much helps. Heat helps some   PRECAUTIONS: None  RED FLAGS: None   WEIGHT BEARING RESTRICTIONS: No  FALLS:  Has patient fallen in last 6 months? No  OCCUPATION: Barista at [employer]   PLOF: Independent  PATIENT GOALS:Reach up her head  NEXT MD VISIT: Not sure   OBJECTIVE:   VITALS: BP 98/66 HR 120 SpO2 100%  DIAGNOSTIC FINDINGS:  CLINICAL DATA:  Right shoulder pain   EXAM: RIGHT SHOULDER - 2+ VIEW   COMPARISON:  None Available.   FINDINGS: There is no evidence of fracture or dislocation. There is no evidence of arthropathy or other focal bone abnormality. Soft tissues are unremarkable.   IMPRESSION: Negative.     Electronically Signed   By: Gerome Sam III M.D.   On: 02/04/2023 20:27    PATIENT SURVEYS:  FOTO 60 with target of 72   COGNITION: Overall cognitive status: Within functional limits for tasks assessed     SENSATION: WFL  POSTURE: No deficits   UPPER EXTREMITY ROM:   Active /Passive ROM Right eval Left eval  Shoulder flexion 120*/120* 180/180  Shoulder extension    Shoulder abduction 90*/110* 180/180  Shoulder adduction     Shoulder internal rotation    Shoulder external rotation    Elbow flexion    Elbow extension    Wrist flexion    Wrist extension    Wrist ulnar deviation    Wrist radial deviation    Wrist pronation    Wrist supination    (Blank rows = not tested)         UPPER EXTREMITY MMT:  MMT Right eval Left eval  Shoulder flexion 4+ 4+  Shoulder extension    Shoulder abduction 4+ 4-  Shoulder adduction    Shoulder internal rotation    Shoulder external rotation    Middle trapezius 4- 4  Lower trapezius 3- 4-  Elbow flexion    Elbow extension    Wrist flexion    Wrist extension    Wrist ulnar deviation    Wrist radial deviation    Wrist pronation    Wrist supination    Grip strength (lbs)    (Blank rows = not tested)  SHOULDER SPECIAL TESTS: Impingement tests: Painful arc test: positive  and   SLAP lesions:  NT  Rotator cuff assessment: Drop arm test: negative, Empty can test: Not performed , Full can test: negative, Belly press test: positive , and Infraspinatus test: negative Biceps assessment: Speed's test: positive   JOINT MOBILITY TESTING:  NT   PALPATION:  Right supra and infraspinatus TTP    TODAY'S TREATMENT:  DATE:    08/16/23:  UBE Seat 9 for resistance of 3 -2.5 Forward and 2.5 Backward  Supine shoulder flexion AAROM with wand and 5# AW x 10, with 3-5 second hold  Supine chest press with wand and 5# AW 2 x 10 Supine shoulder flexion AROM with 4# DB 2 x 10  Seated shoulder IR stretch with towel 3 x 30 seconds  Seated shoulder IR MET with towel assistance 6 seconds stretch/6 seconds isometrics Seated row at OMEGA 25# 2 x 10   08/12/23: THEREX UBE Seat 9 for resistance of 2 -2.5 Forward and 2.5 Backward  Supine Shoulder Flexion and Extension AAROM with wand and #5 AW 1 x 10  Supine Shoulder Flexion AROM with #3 DB 1  x 10  Palpation:  -Right lat increased tension  -Right pec TTP  Left side lying should abduction with #3 AW 1 x 10  -Reaches 90 degrees AROM   08/05/23: UBE Seat 9 for resistance of 2 -2.5 Forward and 2.5 Backward  OMEGA Standing Rows #15 1 x 10  Seated Shoulder Abduction AROM with silver ball 1 x 10  Seated Shoulder Flexion AROM with silver ball 1 x 10  Supine Shoulder AAROM ER/IR at 90 deg 1 x 10  Supine Shoulder IR/ER AAROM at 90 deg 3 x 10      08/02/23: All single leg exercises performed on RUE   THEREX:  UBE with seat at 10-3 min forward and 3 min backward with resistance of 2  OMEGA Single Arm Rows with #5 2 x 10  OMEGA Single Arm Rows with yellow band 1 x 10  AAROM Abduction Shoulder Pulleys x 30  AROM Shoulder Abductions wall slides 3 x 10  Shoulder Abduction with resistance and extended elbow and yellow band 1 x 10  -Pt unable to achieve full 90 deg abduction  Shoulder Abduction with #1 DB 2 x 10  -min VC to flex elbow to allow for more shoulder abduction.  -visual input with mirror to provide patient with visual cues Shoulder Flexion with #1 DB 3 x 10  -visual input with mirror to provide patient with visual cues Shoulder ER with RUE at 90 deg abduction 1 x 10  -Pt unable to keep elbow from flexing  Shoulder ER AAROM with RUE at 90 deg abduction 1 x 10   07/21/23: All single UE exercises performed on RUE   THEREX:  UBE with seat at 10-2.5 min forward and 2.5 min backward  Shoulder Pulleys AAROM abduction/adduction  Shoulder Pulleys AAROM flexion/extension x 30   Shoulder AROM Measurement  Shoulder Flex R 122 Abd R 110   Shoulder AROM Wall Walks Flex 1 x 10  Shoulder AROM Wall Slides Flex 1 x 10  Shoulder AROM Wall Walks Abd 1 x 10  Shoulder AROM Wall Slides Abd 1 x 10  Shoulder Internal Rotation AAROM with pulleys 1 x 10  Shoulder External Rotation AAROM 1 x 10 with pulleys 1 x 10  OMEGA Seated Rows #25 1 x 10  OMEGA Standing Rows #5 1 x 10  -min VC to  decease speed of eccentric phase of exercise   OMEGA Standing Rows #10 1 x 10  -min VC to decease speed of eccentric phase of exercise    Shoulder horizontal abduction at 90 deg abduction 3 x 10   07/19/23:  THEREX:  UBE with seat at 10-2.5 min forward and 2.5 min backward  OMEGA Seated Row #25 1 x 10 -Pt shows increased  compensation on RUE with biceps over activation  Single Arm (RUE) with water jug 1 x 10   Standing Internal Rotation Stretch on RUE 3 x 30 sec  Left Side Lying External Rotation at 0 deg abduction with #1 DB 3 x 10  FOTO: 56/100    MANUAL  Right upper trap and periscapular soft tissue massage      PATIENT EDUCATION: Education details: form and technique for correct performance of exercise  Person educated: Patient Education method: Explanation, Demonstration, Verbal cues, and Handouts Education comprehension: verbalized understanding, returned demonstration, and verbal cues required  HOME EXERCISE PROGRAM: Access Code: [email or web address] URL: [email or web address] Date: 08/12/2023 Prepared by: Ellin Goodie  Exercises - Seated Upper Trapezius Stretch  - 1 x daily - 3 reps - 30 sec hold - Prone Chest Stretch on Chair  - 2-3 x weekly - 3 reps - 30 sec  hold - Seated Shoulder Flexion AAROM with Pulley Behind  - 1 x daily - 3 sets - 10 reps - Seated Shoulder Abduction AAROM with Pulley Behind  - 1 x daily - 3 sets - 10 reps - Seated Shoulder Internal Rotation AAROM with Pulley  - 1 x daily - 2 sets - 10 reps -  2 sec  hold - Standing Overhead Shoulder External Rotation Stretch with Towel  - 1 x daily - 2 sets - 10 reps - 2 sec  hold - Scare the Bear   - 1 x weekly - 3 sets - 10 reps - Sidelying Shoulder Abduction Palm Forward  - 1 x daily - 3 sets - 10 reps   ASSESSMENT:  CLINICAL IMPRESSION    Continued to focus on ROM exercises to improved right shoulder ROM, but continues to be limited in ER/IR. Tolerated treatment session well with increase in  resistance. 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    OBJECTIVE IMPAIRMENTS: decreased ROM, decreased strength, impaired UE functional use, and pain.   ACTIVITY LIMITATIONS: carrying, lifting, reach over head, and hygiene/grooming  PARTICIPATION LIMITATIONS: shopping and occupation  PERSONAL FACTORS: Time since onset of injury/illness/exacerbation and 1-2 comorbidities: Sinus Tachycardia   are also affecting patient's functional outcome.   REHAB POTENTIAL: Good  CLINICAL DECISION MAKING: Stable/uncomplicated  EVALUATION COMPLEXITY: Low   GOALS: Goals reviewed with patient? No  SHORT TERM GOALS: Target date: 06/30/2023  Pt will be independent with HEP in order to improve strength and balance in order to decrease fall risk and improve function at home and work. Baseline: NT 07/07/23: Performing Independently  Goal status: ACHIEVED   2.  Patient will identify UE activities at job or at home that she needs to modify to avoid overuse of right shoulder to improve RUE function  Baseline: NT 08/05/23: Able to perform independently  Goal status: ACHIEVED     LONG TERM GOALS: Target date: 08/25/2023  Patient will have improved function and activity level as evidenced by an increase in FOTO score by 10 points or more.  Baseline: 60/100 with target of 72  07/19/23: 56   Goal status: ONGOING   2.  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  Baseline: Shoulder Flex  R/L 120/180, Shoulder Abd R/L 90/180 07/21/23: Shoulder Flex R 122 Abd R 110  Goal status: ONGOING   3.  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  Baseline:  Shoulder Abd R/L 4-/4+, Mid Trap R/L 4/4-  07/21/23: Shoulder Abd R 4, Shoulder Mid Trap 4- Goal status: ONGOING     PLAN:  PT FREQUENCY: 1-2x/week  PT DURATION: 10 weeks  PLANNED INTERVENTIONS: Therapeutic exercises, Therapeutic activity, Neuromuscular re-education, Gait training, Patient/Family education, Self Care, Joint mobilization, Joint manipulation, Aquatic Therapy, Dry Needling, Electrical stimulation, Spinal manipulation, Spinal mobilization, Cryotherapy, Moist heat, Manual therapy, and Re-evaluation  PLAN FOR NEXT SESSION: Dry needling of latissimus dorsi and pec. Continue to progress shoulder ROM exercises:  Continue to progress shoulder and periscapular strengthening: Lower Trap Wall Setting, D2 PNF FLEXION and Extension   Maylon Peppers, PT, DPT  [facility] [address], [place], [address] Phone: [phone]   Fax:  [phone]

## 2023-08-19 ENCOUNTER — Ambulatory Visit: Payer: 59 | Admitting: Physical Therapy

## 2023-08-19 ENCOUNTER — Encounter: Payer: Self-pay | Admitting: Physical Therapy

## 2023-08-19 DIAGNOSIS — G8929 Other chronic pain: Secondary | ICD-10-CM | POA: Diagnosis not present

## 2023-08-19 DIAGNOSIS — M25511 Pain in right shoulder: Secondary | ICD-10-CM | POA: Diagnosis not present

## 2023-08-19 NOTE — Therapy (Addendum)
OUTPATIENT PHYSICAL THERAPY SHOULDER PROGRESS   Patient Name: Kayla Erickson MRN: [id number] DOB:[birth year], 42 y.o., female Today's Date: 08/19/2023  END OF SESSION:  PT End of Session - 08/19/23 0825     Visit Number 10    Number of Visits 20    Date for PT Re-Evaluation 08/25/23    Authorization Type [employer] 2024 30 OT/PT    Authorization - Visit Number 10    Authorization - Number of Visits 20    Progress Note Due on Visit 10    PT Start Time 0820    PT Stop Time 0900    PT Time Calculation (min) 40 min    Activity Tolerance Patient tolerated treatment well    Behavior During Therapy [facility] for tasks assessed/performed                Past Medical History:  Diagnosis Date   Allergic rhinitis    Diabetes mellitus type 2 with complications (HCC)    DKA (diabetic ketoacidoses) 01/15/2017   Hypertension    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)    SINUS TACHYCARDIA 11/24/2007   Qualifier: Diagnosis of  By: Gwenlyn Perking MD, Carlos     Urine test positive for microalbuminuria 04/28/2022   Past Surgical History:  Procedure Laterality Date   WISDOM TOOTH EXTRACTION     Patient Active Problem List   Diagnosis Date Noted   Chronic pain in right shoulder 05/04/2023   Seasonal allergic rhinitis due to pollen 06/15/2022   Elevated creatine kinase 06/15/2022   Elevated liver function tests 04/28/2022   Hypomagnesemia 04/28/2022   Attention deficit hyperactivity disorder (ADHD), predominantly inattentive type 04/28/2022   Diabetic retinopathy (HCC) 03/28/2013   Diabetes mellitus type 2 with complications (HCC)    Essential hypertension 03/25/2010    PCP: Dr. Mort Sawyers   REFERRING PROVIDER: Dr. Jari Sportsman   REFERRING DIAG: [phone] (ICD-10-CM) - Chronic pain in right shoulder  THERAPY DIAG:  Chronic right shoulder pain  Rationale for Evaluation and Treatment: Rehabilitation  ONSET DATE: 12/16/2022   SUBJECTIVE:                                                                                                                                                                                       SUBJECTIVE STATEMENT:  Patient continues to experience R shoulder feels stiffness this AM. 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   Hand dominance: Right  PERTINENT HISTORY: Pt reports that her right shoulder pain started when pushing a heavy shopping cart at [employer] which was 6 mo ago.  Her shoulder has since pained her. 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 Yes: NPRS scale: 1/10 Pain location: Periscapular  Pain description: Achy  Aggravating factors: Reaching overhead and internally rotating arm  Relieving factors: Tylenol but not much helps. Heat helps some   PRECAUTIONS: None  RED FLAGS: None   WEIGHT BEARING RESTRICTIONS: No  FALLS:  Has patient fallen in last 6 months? No  OCCUPATION: Barista at [employer]   PLOF: Independent  PATIENT GOALS:Reach up her head  NEXT MD VISIT: Not sure   OBJECTIVE:   VITALS: BP 98/66 HR 120 SpO2 100%  DIAGNOSTIC FINDINGS:  CLINICAL DATA:  Right shoulder pain   EXAM: RIGHT SHOULDER - 2+ VIEW   COMPARISON:  None Available.   FINDINGS: There is no evidence of fracture or dislocation. There is no evidence of arthropathy or other focal bone abnormality. Soft tissues are unremarkable.   IMPRESSION: Negative.     Electronically Signed   By: Gerome Sam III M.D.   On: 02/04/2023 20:27    PATIENT SURVEYS:  FOTO 60 with target of 72   COGNITION: Overall cognitive status: Within functional limits for tasks assessed     SENSATION: WFL  POSTURE: No deficits   UPPER EXTREMITY ROM:   Active /Passive ROM Right eval Left eval  Shoulder flexion 120*/120* 180/180  Shoulder extension     Shoulder abduction 90*/110* 180/180  Shoulder adduction    Shoulder internal rotation    Shoulder external rotation    Elbow flexion    Elbow extension    Wrist flexion    Wrist extension    Wrist ulnar deviation    Wrist radial deviation    Wrist pronation    Wrist supination    (Blank rows = not tested)         UPPER EXTREMITY MMT:  MMT Right eval Left eval  Shoulder flexion 4+ 4+  Shoulder extension    Shoulder abduction 4+ 4-  Shoulder adduction    Shoulder internal rotation    Shoulder external rotation    Middle trapezius 4- 4  Lower trapezius 3- 4-  Elbow flexion    Elbow extension    Wrist flexion    Wrist extension    Wrist ulnar deviation    Wrist radial deviation    Wrist pronation    Wrist supination    Grip strength (lbs)    (Blank rows = not tested)  SHOULDER SPECIAL TESTS: Impingement tests: Painful arc test: positive  and   SLAP lesions:  NT  Rotator cuff assessment: Drop arm test: negative, Empty can test: Not performed , Full can test: negative, Belly press test: positive , and Infraspinatus test: negative Biceps assessment: Speed's test: positive   JOINT MOBILITY TESTING:  NT   PALPATION:  Right supra and infraspinatus TTP    TODAY'S TREATMENT:  DATE:    08/19/23:  THEREX  UBE Seat 9 for resistance of 3 -4 Forward and 2 Backward  Shoulder Internal Rotation Stretch 3 x 30 sec    Shoulder External Rotation Stretch with want weighted with 5lb AW 3 x 30 sec  Discussion about HEP and modification to frequency to 1x per week   MANUAL   Performed by: Ellin Goodie, PT, DPT Trigger Point Dry-Needling  Treatment instructions: Expect mild to moderate muscle soreness. S/S of pneumothorax if dry needled over a lung field, and to seek immediate medical attention should they occur. Patient verbalized  understanding of these instructions and education.  Patient Consent Given: Yes Muscles treated: R biceps, R latissimus dorsi  Electrical stimulation performed: No Parameters: N/A Treatment response/outcome: No twitch response     08/16/23:  UBE Seat 9 for resistance of 3 -2.5 Forward and 2.5 Backward  Supine shoulder flexion AAROM with wand and 5# AW x 10, with 3-5 second hold  Supine chest press with wand and 5# AW 2 x 10 Supine shoulder flexion AROM with 4# DB 2 x 10  Seated shoulder IR stretch with towel 3 x 30 seconds  Seated shoulder IR MET with towel assistance 6 seconds stretch/6 seconds isometrics Seated row at OMEGA 25# 2 x 10   08/12/23: THEREX UBE Seat 9 for resistance of 2 -2.5 Forward and 2.5 Backward  Supine Shoulder Flexion and Extension AAROM with wand and #5 AW 1 x 10  Supine Shoulder Flexion AROM with #3 DB 1 x 10  Palpation:  -Right lat increased tension  -Right pec TTP  Left side lying should abduction with #3 AW 1 x 10  -Reaches 90 degrees AROM   08/05/23: UBE Seat 9 for resistance of 2 -2.5 Forward and 2.5 Backward  OMEGA Standing Rows #15 1 x 10  Seated Shoulder Abduction AROM with silver ball 1 x 10  Seated Shoulder Flexion AROM with silver ball 1 x 10  Supine Shoulder AAROM ER/IR at 90 deg 1 x 10  Supine Shoulder IR/ER AAROM at 90 deg 3 x 10      08/02/23: All single leg exercises performed on RUE   THEREX:  UBE with seat at 10-3 min forward and 3 min backward with resistance of 2  OMEGA Single Arm Rows with #5 2 x 10  OMEGA Single Arm Rows with yellow band 1 x 10  AAROM Abduction Shoulder Pulleys x 30  AROM Shoulder Abductions wall slides 3 x 10  Shoulder Abduction with resistance and extended elbow and yellow band 1 x 10  -Pt unable to achieve full 90 deg abduction  Shoulder Abduction with #1 DB 2 x 10  -min VC to flex elbow to allow for more shoulder abduction.  -visual input with mirror to provide patient with visual cues Shoulder  Flexion with #1 DB 3 x 10  -visual input with mirror to provide patient with visual cues Shoulder ER with RUE at 90 deg abduction 1 x 10  -Pt unable to keep elbow from flexing  Shoulder ER AAROM with RUE at 90 deg abduction 1 x 10   07/21/23: All single UE exercises performed on RUE   THEREX:  UBE with seat at 10-2.5 min forward and 2.5 min backward  Shoulder Pulleys AAROM abduction/adduction  Shoulder Pulleys AAROM flexion/extension x 30   Shoulder AROM Measurement  Shoulder Flex R 122 Abd R 110   Shoulder AROM Wall Walks Flex 1 x 10  Shoulder AROM Wall Slides Flex  1 x 10  Shoulder AROM Wall Walks Abd 1 x 10  Shoulder AROM Wall Slides Abd 1 x 10  Shoulder Internal Rotation AAROM with pulleys 1 x 10  Shoulder External Rotation AAROM 1 x 10 with pulleys 1 x 10  OMEGA Seated Rows #25 1 x 10  OMEGA Standing Rows #5 1 x 10  -min VC to decease speed of eccentric phase of exercise   OMEGA Standing Rows #10 1 x 10  -min VC to decease speed of eccentric phase of exercise    Shoulder horizontal abduction at 90 deg abduction 3 x 10        PATIENT EDUCATION: Education details: form and technique for correct performance of exercise  Person educated: Patient Education method: Explanation, Demonstration, Verbal cues, and Handouts Education comprehension: verbalized understanding, returned demonstration, and verbal cues required  HOME EXERCISE PROGRAM: Access Code: [email or web address] URL: [email or web address] Date: 08/19/2023 Prepared by: Ellin Goodie  Exercises - Prone Chest Stretch on Chair  - 2-3 x weekly - 3 reps - 30 sec  hold - Seated Shoulder Flexion AAROM with Pulley Behind  - 1 x daily - 3 sets - 10 reps - Seated Shoulder Abduction AAROM with Pulley Behind  - 1 x daily - 3 sets - 10 reps - Supine Shoulder External Rotation in 45 Degrees Abduction AAROM with Dowel  - 1 x daily - 3 reps - 60 sec hold - Standing Shoulder Internal Rotation Stretch with Towel  - 1 x daily  - 3 reps - 60 sec  hold - Scare the Bear   - 1 x weekly - 3 sets - 10 reps - Sidelying Shoulder Abduction Palm Forward  - 1 x daily - 3 sets - 10 reps  ASSESSMENT:  CLINICAL IMPRESSION    Pt continues to progress towards rehab goals with improved perception of right shoulder function. She continues to be limited with right shoulder ROM, but this is well within the timeline for frozen shoulder and she remains within the thawing phase with gradual return in ROM with minimal increase in pain. 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   OBJECTIVE IMPAIRMENTS: decreased ROM, decreased strength, impaired UE functional use, and pain.   ACTIVITY LIMITATIONS: carrying, lifting, reach over head, and hygiene/grooming  PARTICIPATION LIMITATIONS: shopping and occupation  PERSONAL FACTORS: Time since onset of injury/illness/exacerbation and 1-2 comorbidities: Sinus Tachycardia   are also affecting patient's functional outcome.   REHAB POTENTIAL: Good  CLINICAL DECISION MAKING: Stable/uncomplicated  EVALUATION COMPLEXITY: Low   GOALS: Goals reviewed with patient? No  SHORT TERM GOALS: Target date: 06/30/2023  Pt will be independent with HEP in order to improve strength and balance in order to decrease fall risk and improve function at home and work. Baseline: NT 07/07/23: Performing Independently  Goal status: ACHIEVED   2.  Patient will identify UE activities at job or at home that she needs to modify to avoid overuse of right shoulder to improve RUE function  Baseline: NT 08/05/23: Able to perform independently  Goal status: ACHIEVED     LONG TERM GOALS: Target date: 08/25/2023  Patient will have improved function and activity level as evidenced by an increase in FOTO  score by 10 points or more.  Baseline: 60/100 with target of 72  07/19/23: 56  08/19/23: 63  Goal status: ONGOING   2.  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  Baseline: Shoulder Flex R/L 120/180, Shoulder Abd R/L 90/180 07/21/23: Shoulder Flex R 122 Abd R 110  Goal status: ONGOING   3.  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  Baseline:  Shoulder Abd R/L 4-/4+, Mid Trap R/L 4/4-  07/21/23: Shoulder Abd R 4, Shoulder Mid Trap 4- Goal status: ONGOING     PLAN:  PT FREQUENCY: 1-2x/week  PT DURATION: 10 weeks  PLANNED INTERVENTIONS: Therapeutic exercises, Therapeutic activity, Neuromuscular re-education, Gait training, Patient/Family education, Self Care, Joint mobilization, Joint manipulation, Aquatic Therapy, Dry Needling, Electrical stimulation, Spinal manipulation, Spinal mobilization, Cryotherapy, Moist heat, Manual therapy, and Re-evaluation  PLAN FOR NEXT SESSION: Re-certification. Dry needling of pec. Measure shoulder ROM and strength for goals.  Continue to progress shoulder and periscapular strengthening: Lower Trap Wall Setting, D2 PNF FLEXION and Extension   Maylon Peppers, PT, DPT  [facility] [address] [place], [address] Phone: [phone]   Fax:  [phone]

## 2023-08-23 ENCOUNTER — Ambulatory Visit: Payer: 59 | Attending: Physician Assistant | Admitting: Physical Therapy

## 2023-08-23 ENCOUNTER — Encounter: Payer: Self-pay | Admitting: Physical Therapy

## 2023-08-23 DIAGNOSIS — M25511 Pain in right shoulder: Secondary | ICD-10-CM | POA: Diagnosis not present

## 2023-08-23 DIAGNOSIS — G8929 Other chronic pain: Secondary | ICD-10-CM | POA: Diagnosis not present

## 2023-08-23 NOTE — Therapy (Signed)
OUTPATIENT PHYSICAL THERAPY SHOULDER TREATMENT/ Re-certification    Dates of Reporting: 06/16/23-08/23/23  Patient Name: Kayla Erickson MRN: [id number] DOB:07-08-1981, 42 y.o., female Today's Date: 08/23/2023  END OF SESSION:  PT End of Session - 08/23/23 1117     Visit Number 11    Number of Visits 20    Date for PT Re-Evaluation 08/25/23    Authorization Type [employer] 2024 30 OT/PT    Authorization - Visit Number 11    Authorization - Number of Visits 20    Progress Note Due on Visit 10    PT Start Time 1115    PT Stop Time 1200    PT Time Calculation (min) 45 min    Activity Tolerance Patient tolerated treatment well    Behavior During Therapy WFL for tasks assessed/performed                Past Medical History:  Diagnosis Date   Allergic rhinitis    Diabetes mellitus type 2 with complications (HCC)    DKA (diabetic ketoacidoses) 01/15/2017   Hypertension    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)    SINUS TACHYCARDIA 11/24/2007   Qualifier: Diagnosis of  By: Gwenlyn Perking MD, Carlos     Urine test positive for microalbuminuria 04/28/2022   Past Surgical History:  Procedure Laterality Date   WISDOM TOOTH EXTRACTION     Patient Active Problem List   Diagnosis Date Noted   Chronic pain in right shoulder 05/04/2023   Seasonal allergic rhinitis due to pollen 06/15/2022   Elevated creatine kinase 06/15/2022   Elevated liver function tests 04/28/2022   Hypomagnesemia 04/28/2022   Attention deficit hyperactivity disorder (ADHD), predominantly inattentive type 04/28/2022   Diabetic retinopathy (HCC) 03/28/2013   Diabetes mellitus type 2 with complications (HCC)    Essential hypertension 03/25/2010    PCP: Dr. Mort Sawyers   REFERRING PROVIDER: Dr. Jari Sportsman   REFERRING DIAG: [phone] (ICD-10-CM) - Chronic pain in right shoulder  THERAPY DIAG:  Chronic right shoulder pain  Rationale for Evaluation and  Treatment: Rehabilitation  ONSET DATE: 12/16/2022   SUBJECTIVE:                                                                                                                                                                                      SUBJECTIVE STATEMENT:  Pt states that her right shoulder is still feeling ok even with carrying heavier trays with her new job.   Hand dominance: Right  PERTINENT HISTORY: Pt reports that her right shoulder pain started when pushing a heavy shopping cart at [employer] which was 6 mo ago. Her shoulder has  since pained her. 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 Yes: NPRS scale: 1/10 Pain location: Periscapular  Pain description: Achy  Aggravating factors: Reaching overhead and internally rotating arm  Relieving factors: Tylenol but not much helps. Heat helps some   PRECAUTIONS: None  RED FLAGS: None   WEIGHT BEARING RESTRICTIONS: No  FALLS:  Has patient fallen in last 6 months? No  OCCUPATION: Barista at [employer]   PLOF: Independent  PATIENT GOALS:Reach up her head  NEXT MD VISIT: Not sure   OBJECTIVE:   VITALS: BP 98/66 HR 120 SpO2 100%  DIAGNOSTIC FINDINGS:  CLINICAL DATA:  Right shoulder pain   EXAM: RIGHT SHOULDER - 2+ VIEW   COMPARISON:  None Available.   FINDINGS: There is no evidence of fracture or dislocation. There is no evidence of arthropathy or other focal bone abnormality. Soft tissues are unremarkable.   IMPRESSION: Negative.     Electronically Signed   By: Gerome Sam III M.D.   On: 02/04/2023 20:27    PATIENT SURVEYS:  FOTO 60 with target of 72   COGNITION: Overall cognitive status: Within functional limits for tasks assessed     SENSATION: WFL  POSTURE: No deficits   UPPER EXTREMITY ROM:   Active /Passive ROM Right eval Left eval Right  08/23/23 Left 08/23/23  Shoulder flexion 120*/120*  180/180 125/145*   Shoulder extension      Shoulder abduction 90*/110* 180/180 80/85*    Shoulder adduction      Shoulder internal rotation      Shoulder external rotation      Elbow flexion      Elbow extension      Wrist flexion      Wrist extension      Wrist ulnar deviation      Wrist radial deviation      Wrist pronation      Wrist supination      (Blank rows = not tested)         UPPER EXTREMITY MMT:  MMT Right eval Left eval Right  08/23/23 Left 09/12/23   Shoulder flexion 4+ 4+    Shoulder extension      Shoulder abduction 4+ 4- 4 4  Shoulder adduction      Shoulder internal rotation      Shoulder external rotation      Middle trapezius 4- 4 4 4   Lower trapezius 3- 4- 3- 4  Elbow flexion      Elbow extension      Wrist flexion      Wrist extension      Wrist ulnar deviation      Wrist radial deviation      Wrist pronation      Wrist supination      Grip strength (lbs)      (Blank rows = not tested)  SHOULDER SPECIAL TESTS: Impingement tests: Painful arc test: positive  and   SLAP lesions:  NT  Rotator cuff assessment: Drop arm test: negative, Empty can test: Not performed , Full can test: negative, Belly press test: positive , and Infraspinatus test: negative Biceps assessment: Speed's test: positive   JOINT MOBILITY TESTING:  NT   PALPATION:  Right supra and infraspinatus TTP    TODAY'S TREATMENT:  DATE:    08/23/23: UBE seat at 10 -2.5 forward and 2.5 backward  Shoulder and Periscapular MMT: -Flex R/L 4+/4+ -Abd R/L 4-/4+ -Lower Trap R/L 3- -Mid Trap R/L 4-  Shoulder A/PROM  -Flexion R 125/145* -Abduction R 80/85  Shoulder Abduction AROM RUE Wall Slides 1 x 10  Shoulder External Rotation Stretch with towel 3 x 30 sec   Side Lying Shoulder Abduction with RUE with 3-4 sec hold at terminal ROM 1 x 10   Standing Single UE Shoulder Rows Red band 1 x 10  Standing Single UE Shoulder Rows Green band 1 x 10  Standing Single UE Shoulder Rows Blue band 1 x 10   08/19/23:  THEREX  UBE Seat 9 for resistance of 3 -4 Forward and 2 Backward  Shoulder Internal Rotation Stretch 3 x 30 sec    Shoulder External Rotation Stretch with want weighted with 5lb AW 3 x 30 sec  Discussion about HEP and modification to frequency to 1x per week   MANUAL   Performed by: Ellin Goodie, PT, DPT Trigger Point Dry-Needling  Treatment instructions: Expect mild to moderate muscle soreness. S/S of pneumothorax if dry needled over a lung field, and to seek immediate medical attention should they occur. Patient verbalized understanding of these instructions and education.  Patient Consent Given: Yes Muscles treated: R biceps, R latissimus dorsi  Electrical stimulation performed: No Parameters: N/A Treatment response/outcome: No twitch response     08/16/23:  UBE Seat 9 for resistance of 3 -2.5 Forward and 2.5 Backward  Supine shoulder flexion AAROM with wand and 5# AW x 10, with 3-5 second hold  Supine chest press with wand and 5# AW 2 x 10 Supine shoulder flexion AROM with 4# DB 2 x 10  Seated shoulder IR stretch with towel 3 x 30 seconds  Seated shoulder IR MET with towel assistance 6 seconds stretch/6 seconds isometrics Seated row at OMEGA 25# 2 x 10   08/12/23: THEREX UBE Seat 9 for resistance of 2 -2.5 Forward and 2.5 Backward  Supine Shoulder Flexion and Extension AAROM with wand and #5 AW 1 x 10  Supine Shoulder Flexion AROM with #3 DB 1 x 10  Palpation:  -Right lat increased tension  -Right pec TTP  Left side lying should abduction with #3 AW 1 x 10  -Reaches 90 degrees AROM   PATIENT EDUCATION: Education details: form and technique for correct performance of exercise  Person educated: Patient Education method: Programmer, multimedia, Demonstration, Verbal cues, and Handouts Education comprehension:  verbalized understanding, returned demonstration, and verbal cues required  HOME EXERCISE PROGRAM: Access Code: [email or web address] URL: [email or web address] Date: 08/23/2023 Prepared by: Ellin Goodie  Exercises - Prone Chest Stretch on Chair  - 2-3 x weekly - 3 reps - 30 sec  hold - Seated Shoulder Flexion AAROM with Pulley Behind  - 1 x daily - 3 sets - 10 reps - Seated Shoulder Abduction AAROM with Pulley Behind  - 1 x daily - 3 sets - 10 reps - Standing Overhead Shoulder External Rotation Stretch with Towel  - 2-3 x weekly - 3 reps - 30 sec  hold - Standing Shoulder Internal Rotation Stretch with Towel  - 1 x daily - 3 reps - 60 sec  hold - Scare the Bear   - 1 x weekly - 3 sets - 10 reps - Sidelying Shoulder Abduction Palm Forward  - 1 x daily - 3 sets - 10 reps - Standing Single Arm Shoulder Row with Anchored Resistance  at Chest Height  - 3-4 x weekly - 3 sets - 10 reps  ASSESSMENT:  CLINICAL IMPRESSION    Pt has made some progress with right shoulder strength and ROM, but continues to have limitations with shoulder abduction and flexion and external rotation. Despite these deficits, pt is still able to complete all lifting tasks for her job. PT to decrease frequency to account prolonged timeline for frozen shoulder. 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    OBJECTIVE IMPAIRMENTS: decreased ROM, decreased strength, impaired UE functional use, and pain.   ACTIVITY LIMITATIONS: carrying, lifting, reach over head, and hygiene/grooming  PARTICIPATION LIMITATIONS: shopping and occupation  PERSONAL FACTORS: Time since onset of injury/illness/exacerbation and 1-2 comorbidities: Sinus Tachycardia   are also affecting patient's functional outcome.   REHAB  POTENTIAL: Good  CLINICAL DECISION MAKING: Stable/uncomplicated  EVALUATION COMPLEXITY: Low   GOALS: Goals reviewed with patient? No  SHORT TERM GOALS: Target date: 06/30/2023  Pt will be independent with HEP in order to improve strength and balance in order to decrease fall risk and improve function at home and work. Baseline: NT 07/07/23: Performing Independently  Goal status: ACHIEVED   2.  Patient will identify UE activities at job or at home that she needs to modify to avoid overuse of right shoulder to improve RUE function  Baseline: NT 08/05/23: Able to perform independently  Goal status: ACHIEVED     LONG TERM GOALS: Target date: 08/25/2023  Patient will have improved function and activity level as evidenced by an increase in FOTO score by 10 points or more.  Baseline: 60/100 with target of 72  07/19/23: 56  08/19/23: 63  Goal status: ONGOING   2.  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  Baseline: Shoulder Flex R/L 120/180, Shoulder Abd R/L 90/180 07/21/23: Shoulder Flex R 122 Abd R 110  Goal status: ONGOING   3.  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  Baseline:  Shoulder Abd R/L 4-/4+, Mid Trap R/L 4/4-  07/21/23: Shoulder Abd R 4, Shoulder Mid Trap 4- Goal status: ONGOING     PLAN:  PT FREQUENCY: 1-2x/week  PT DURATION: 10 weeks  PLANNED INTERVENTIONS: Therapeutic exercises, Therapeutic activity, Neuromuscular re-education, Gait training, Patient/Family education, Self Care, Joint mobilization, Joint manipulation, Aquatic Therapy, Dry Needling, Electrical stimulation, Spinal manipulation, Spinal mobilization, Cryotherapy, Moist heat,  Manual therapy, and Re-evaluation  PLAN FOR NEXT SESSION: Continue to progress shoulder and periscapular strengthening: Shoulder mobilizations, Lower Trap Wall Setting, D2 PNF FLEXION and Extension.   Ellin Goodie, PT, DPT  [facility] [address] [place], [address] Phone: [phone]   Fax:  [phone]

## 2023-08-23 NOTE — Addendum Note (Signed)
Addended by: Johnn Hai on: 08/23/2023 01:21 PM   Modules accepted: Orders

## 2023-09-01 ENCOUNTER — Ambulatory Visit: Payer: 59 | Admitting: Physical Therapy

## 2023-09-07 ENCOUNTER — Encounter: Payer: Self-pay | Admitting: Physical Therapy

## 2023-09-07 ENCOUNTER — Ambulatory Visit: Payer: 59 | Admitting: Physical Therapy

## 2023-09-07 DIAGNOSIS — M25511 Pain in right shoulder: Secondary | ICD-10-CM | POA: Diagnosis not present

## 2023-09-07 DIAGNOSIS — G8929 Other chronic pain: Secondary | ICD-10-CM

## 2023-09-07 NOTE — Therapy (Addendum)
OUTPATIENT PHYSICAL THERAPY SHOULDER TREATMENT    Patient Name: Kayla Erickson MRN: [id number] DOB:1981-02-14, 42 y.o., female Today's Date: 09/07/2023  END OF SESSION:  PT End of Session - 09/07/23 0903     Visit Number 12    Number of Visits 20    Date for PT Re-Evaluation 10/26/23    Authorization Type [employer] 2024 30 OT/PT    Authorization - Visit Number 12    Authorization - Number of Visits 20    Progress Note Due on Visit 10    PT Start Time 0900    PT Stop Time 0945    PT Time Calculation (min) 45 min    Activity Tolerance Patient tolerated treatment well    Behavior During Therapy [facility] for tasks assessed/performed                 Past Medical History:  Diagnosis Date   Allergic rhinitis    Diabetes mellitus type 2 with complications (HCC)    DKA (diabetic ketoacidoses) 01/15/2017   Hypertension    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)    SINUS TACHYCARDIA 11/24/2007   Qualifier: Diagnosis of  By: Gwenlyn Perking MD, Carlos     Urine test positive for microalbuminuria 04/28/2022   Past Surgical History:  Procedure Laterality Date   WISDOM TOOTH EXTRACTION     Patient Active Problem List   Diagnosis Date Noted   Chronic pain in right shoulder 05/04/2023   Seasonal allergic rhinitis due to pollen 06/15/2022   Elevated creatine kinase 06/15/2022   Elevated liver function tests 04/28/2022   Hypomagnesemia 04/28/2022   Attention deficit hyperactivity disorder (ADHD), predominantly inattentive type 04/28/2022   Diabetic retinopathy (HCC) 03/28/2013   Diabetes mellitus type 2 with complications (HCC)    Essential hypertension 03/25/2010    PCP: Dr. Mort Sawyers   REFERRING PROVIDER: Dr. Jari Sportsman   REFERRING DIAG: [phone] (ICD-10-CM) - Chronic pain in right shoulder  THERAPY DIAG:  Chronic right shoulder pain  Rationale for Evaluation and Treatment: Rehabilitation  ONSET DATE: 12/16/2022    SUBJECTIVE:                                                                                                                                                                                      SUBJECTIVE STATEMENT:  Pt reports ongoing stiffness in right shoulder with no pain.   Hand dominance: Right  PERTINENT HISTORY: Pt reports that her right shoulder pain started when pushing a heavy shopping cart at [employer] which was 6 mo ago. Her shoulder has since pained her. 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 Yes: NPRS scale: 1/10 Pain location: Periscapular  Pain description: Achy  Aggravating factors: Reaching overhead and internally rotating arm  Relieving factors: Tylenol but not much helps. Heat helps some   PRECAUTIONS: None  RED FLAGS: None   WEIGHT BEARING RESTRICTIONS: No  FALLS:  Has patient fallen in last 6 months? 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   FINDINGS: There is no evidence of fracture or dislocation. There is no evidence of arthropathy or other focal bone abnormality. Soft tissues are unremarkable.   IMPRESSION: Negative.     Electronically Signed   By: Gerome Sam III M.D.   On: 02/04/2023 20:27    PATIENT SURVEYS:  FOTO 60 with target of 72   COGNITION: Overall cognitive status: Within functional limits for tasks assessed     SENSATION: WFL  POSTURE: No deficits   UPPER EXTREMITY ROM:   Active /Passive ROM Right eval Left eval Right  08/23/23 Left 08/23/23  Shoulder flexion 120*/120* 180/180 125/145*   Shoulder extension      Shoulder abduction 90*/110* 180/180 80/85*    Shoulder  adduction      Shoulder internal rotation      Shoulder external rotation      Elbow flexion      Elbow extension      Wrist flexion      Wrist extension      Wrist ulnar deviation      Wrist radial deviation      Wrist pronation      Wrist supination      (Blank rows = not tested)         UPPER EXTREMITY MMT:  MMT Right eval Left eval Right  08/23/23 Left 09/12/23   Shoulder flexion 4+ 4+    Shoulder extension      Shoulder abduction 4+ 4- 4 4  Shoulder adduction      Shoulder internal rotation      Shoulder external rotation      Middle trapezius 4- 4 4 4   Lower trapezius 3- 4- 3- 4  Elbow flexion      Elbow extension      Wrist flexion      Wrist extension      Wrist ulnar deviation      Wrist radial deviation      Wrist pronation      Wrist supination      Grip strength (lbs)      (Blank rows = not tested)  SHOULDER SPECIAL TESTS: Impingement tests: Painful arc test: positive  and   SLAP lesions:  NT  Rotator cuff assessment: Drop arm test: negative, Empty can test: Not performed , Full can test: negative, Belly press test: positive , and Infraspinatus test: negative Biceps assessment: Speed's test: positive   JOINT MOBILITY TESTING:  NT   PALPATION:  Right supra and infraspinatus TTP    TODAY'S TREATMENT:  DATE:    09/07/23: THEREX  UBE 2.5 min forward and 2.5 min back with resistance of 3   Shoulder AROM  -Flexion R 120 -Abduction R 110  Supine R Shoulder Flexion AAROM with PVC and #5 Ankle Weight 1 x 10  Supine R Shoulder Abduction AAROM with PVC 1 x 10  R Shoulder Flexion to 120 deg with #1 DB 1 x 10  Supine R Shoulder Flexion to 90 deg  1 x 10    MANUAL  R Shoulder Inferior glides grade III-IV x 30  R Shoulder Anterior glides grade II-III x 30   08/23/23: UBE seat at 10 -2.5 forward and 2.5 backward   Shoulder and Periscapular MMT: -Flex R/L 4+/4+ -Abd R/L 4-/4+ -Lower Trap R/L 3- -Mid Trap R/L 4-  Shoulder A/PROM  -Flexion R 125/145* -Abduction R 80/85   Shoulder Abduction AROM RUE Wall Slides 1 x 10  Shoulder External Rotation Stretch with towel 3 x 30 sec   Side Lying Shoulder Abduction with RUE with 3-4 sec hold at terminal ROM 1 x 10  Standing Single UE Shoulder Rows Red band 1 x 10  Standing Single UE Shoulder Rows Green band 1 x 10  Standing Single UE Shoulder Rows Blue band 1 x 10   08/19/23:  THEREX  UBE Seat 9 for resistance of 3 -4 Forward and 2 Backward  Shoulder Internal Rotation Stretch 3 x 30 sec    Shoulder External Rotation Stretch with want weighted with 5lb AW 3 x 30 sec  Discussion about HEP and modification to frequency to 1x per week   MANUAL   Performed by: Ellin Goodie, PT, DPT Trigger Point Dry-Needling  Treatment instructions: Expect mild to moderate muscle soreness. S/S of pneumothorax if dry needled over a lung field, and to seek immediate medical attention should they occur. Patient verbalized understanding of these instructions and education.  Patient Consent Given: Yes Muscles treated: R biceps, R latissimus dorsi  Electrical stimulation performed: No Parameters: N/A Treatment response/outcome: No twitch response     08/16/23:  UBE Seat 9 for resistance of 3 -2.5 Forward and 2.5 Backward  Supine shoulder flexion AAROM with wand and 5# AW x 10, with 3-5 second hold  Supine chest press with wand and 5# AW 2 x 10 Supine shoulder flexion AROM with 4# DB 2 x 10  Seated shoulder IR stretch with towel 3 x 30 seconds  Seated shoulder IR MET with towel assistance 6 seconds stretch/6 seconds isometrics Seated row at OMEGA 25# 2 x 10   08/12/23: THEREX UBE Seat 9 for resistance of 2 -2.5 Forward and 2.5 Backward  Supine Shoulder Flexion and Extension AAROM with wand and #5 AW 1 x 10  Supine Shoulder Flexion AROM with #3 DB 1 x 10   Palpation:  -Right lat increased tension  -Right pec TTP  Left side lying should abduction with #3 AW 1 x 10  -Reaches 90 degrees AROM   PATIENT EDUCATION: Education details: form and technique for correct performance of exercise  Person educated: Patient Education method: Programmer, multimedia, Demonstration, Verbal cues, and Handouts Education comprehension: verbalized understanding, returned demonstration, and verbal cues required  HOME EXERCISE PROGRAM: Access Code: [email or web address] URL: [email or web address] Date: 09/07/2023 Prepared by: Ellin Goodie  Exercises - Prone Chest Stretch on Chair  - 2-3 x weekly - 3 reps - 30 sec  hold - Seated Shoulder Flexion AAROM with Pulley Behind  - 1 x daily - 3 sets - 10 reps - Seated  Shoulder Abduction AAROM with Pulley Behind  - 1 x daily - 3 sets - 10 reps - Standing Overhead Shoulder External Rotation Stretch with Towel  - 2-3 x weekly - 3 reps - 30 sec  hold - Standing Shoulder Internal Rotation Stretch with Towel  - 1 x daily - 3 reps - 60 sec  hold - Scare the Bear   - 1 x weekly - 3 sets - 10 reps - Supine Shoulder Abduction AAROM with Dowel  - 1 x daily - 3 sets - 10 reps - Standing Shoulder Flexion to 180 degrees with dumbbells   - 3-4 x weekly - 3 sets - 10 reps - Supine Shoulder Abduction AROM  - 3-4 x weekly - 3 sets - 10 reps  ASSESSMENT:  CLINICAL IMPRESSION    Pt shows improvement with right shoulder AROM with increased shoulder abduction. Home exercises progressed to include weighted shoulder flexion and abduction within available ROM and shoulder AROM in gravity neutral position from gravity aided position. 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   OBJECTIVE IMPAIRMENTS: decreased ROM, decreased  strength, impaired UE functional use, and pain.   ACTIVITY LIMITATIONS: carrying, lifting, reach over head, and hygiene/grooming  PARTICIPATION LIMITATIONS: shopping and occupation  PERSONAL FACTORS: Time since onset of injury/illness/exacerbation and 1-2 comorbidities: Sinus Tachycardia   are also affecting patient's functional outcome.   REHAB POTENTIAL: Good  CLINICAL DECISION MAKING: Stable/uncomplicated  EVALUATION COMPLEXITY: Low   GOALS: Goals reviewed with patient? No  SHORT TERM GOALS: Target date: 06/30/2023  Pt will be independent with HEP in order to improve strength and balance in order to decrease fall risk and improve function at home and work. Baseline: NT 07/07/23: Performing Independently  Goal status: ACHIEVED   2.  Patient will identify UE activities at job or at home that she needs to modify to avoid overuse of right shoulder to improve RUE function  Baseline: NT 08/05/23: Able to perform independently  Goal status: ACHIEVED     LONG TERM GOALS: Target date: 08/25/2023  Patient will have improved function and activity level as evidenced by an increase in FOTO score by 10 points or more.  Baseline: 60/100 with target of 72  07/19/23: 56  08/19/23: 63  Goal status: ONGOING   2.  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  Baseline: Shoulder Flex R/L 120/180, Shoulder Abd R/L 90/180 07/21/23: Shoulder Flex R 122 Abd R 110 09/07/23: Shoulder AROM Flex R 120, Abd R 110  Goal status: ONGOING   3.  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  Baseline:  Shoulder Abd R/L 4-/4+, Mid Trap R/L 4/4-  07/21/23: Shoulder Abd R 4, Shoulder Mid Trap  4- Goal status: ONGOING     PLAN:  PT FREQUENCY: 1-2x/week  PT DURATION: 10 weeks  PLANNED INTERVENTIONS: Therapeutic exercises, Therapeutic activity, Neuromuscular re-education, Gait training, Patient/Family education, Self Care, Joint mobilization, Joint manipulation, Aquatic Therapy, Dry Needling, Electrical stimulation, Spinal manipulation, Spinal mobilization,  Cryotherapy, Moist heat, Manual therapy, and Re-evaluation  PLAN FOR NEXT SESSION: Focus on periscapular strengthening: shoulder horizontal abduction and shoulder rows   Ellin Goodie, PT, DPT  [facility] [address], [place], [address] Phone: [phone]   Fax:  [phone]

## 2023-09-15 ENCOUNTER — Encounter: Payer: 59 | Admitting: Physical Therapy

## 2023-09-20 ENCOUNTER — Ambulatory Visit: Payer: 59 | Attending: Physician Assistant | Admitting: Physical Therapy

## 2023-09-20 DIAGNOSIS — M25511 Pain in right shoulder: Secondary | ICD-10-CM | POA: Insufficient documentation

## 2023-09-20 DIAGNOSIS — G8929 Other chronic pain: Secondary | ICD-10-CM

## 2023-09-20 NOTE — Therapy (Signed)
OUTPATIENT PHYSICAL THERAPY SHOULDER TREATMENT    Patient Name: Kayla Erickson MRN: [id number] DOB:10/16/1981, 42 y.o., female Today's Date: 09/20/2023  END OF SESSION:  PT End of Session - 09/20/23 0827     Visit Number 13    Number of Visits 20    Date for PT Re-Evaluation 10/26/23    Authorization Type [employer] 2024 30 OT/PT    Authorization - Visit Number 13    Authorization - Number of Visits 20    Progress Note Due on Visit 10    PT Start Time 0820    PT Stop Time 0900    PT Time Calculation (min) 40 min    Activity Tolerance Patient tolerated treatment well    Behavior During Therapy [facility] for tasks assessed/performed                  Past Medical History:  Diagnosis Date   Allergic rhinitis    Diabetes mellitus type 2 with complications (HCC)    DKA (diabetic ketoacidoses) 01/15/2017   Hypertension    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)    SINUS TACHYCARDIA 11/24/2007   Qualifier: Diagnosis of  By: Gwenlyn Perking MD, Carlos     Urine test positive for microalbuminuria 04/28/2022   Past Surgical History:  Procedure Laterality Date   WISDOM TOOTH EXTRACTION     Patient Active Problem List   Diagnosis Date Noted   Chronic pain in right shoulder 05/04/2023   Seasonal allergic rhinitis due to pollen 06/15/2022   Elevated creatine kinase 06/15/2022   Elevated liver function tests 04/28/2022   Hypomagnesemia 04/28/2022   Attention deficit hyperactivity disorder (ADHD), predominantly inattentive type 04/28/2022   Diabetic retinopathy (HCC) 03/28/2013   Diabetes mellitus type 2 with complications (HCC)    Essential hypertension 03/25/2010    PCP: Dr. Mort Sawyers   REFERRING PROVIDER: Dr. Jari Sportsman   REFERRING DIAG: [phone] (ICD-10-CM) - Chronic pain in right shoulder  THERAPY DIAG:  Chronic right shoulder pain  Rationale for Evaluation and Treatment: Rehabilitation  ONSET DATE: 12/16/2022    SUBJECTIVE:                                                                                                                                                                                      SUBJECTIVE STATEMENT:  Pt states that she is having ongoing stiffness and she has been having trouble finding time to do the exercises because of her busy schedule.   Hand dominance: Right  PERTINENT HISTORY: Pt reports that her right shoulder pain started when pushing a heavy shopping cart at [employer] which was 6 mo ago. Her  shoulder has since pained her. 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 No  PRECAUTIONS: None  RED FLAGS: None   WEIGHT BEARING RESTRICTIONS: No  FALLS:  Has patient fallen in last 6 months? No  OCCUPATION: Barista at [employer]   PLOF: Independent  PATIENT GOALS:Reach up her head  NEXT MD VISIT: Not sure   OBJECTIVE:   VITALS: BP 98/66 HR 120 SpO2 100%  DIAGNOSTIC FINDINGS:  CLINICAL DATA:  Right shoulder pain   EXAM: RIGHT SHOULDER - 2+ VIEW   COMPARISON:  None Available.   FINDINGS: There is no evidence of fracture or dislocation. There is no evidence of arthropathy or other focal bone abnormality. Soft tissues are unremarkable.   IMPRESSION: Negative.     Electronically Signed   By: Gerome Sam III M.D.   On: 02/04/2023 20:27    PATIENT SURVEYS:  FOTO 60 with target of 72   COGNITION: Overall cognitive status: Within functional limits for tasks assessed     SENSATION: WFL  POSTURE: No deficits   UPPER EXTREMITY ROM:   Active /Passive ROM Right eval Left eval Right  08/23/23 Left 08/23/23  Shoulder flexion 120*/120* 180/180 125/145*   Shoulder extension      Shoulder abduction 90*/110* 180/180 80/85*    Shoulder adduction      Shoulder internal rotation      Shoulder external rotation      Elbow flexion      Elbow extension       Wrist flexion      Wrist extension      Wrist ulnar deviation      Wrist radial deviation      Wrist pronation      Wrist supination      (Blank rows = not tested)         UPPER EXTREMITY MMT:  MMT Right eval Left eval Right  08/23/23 Left 09/12/23   Shoulder flexion 4+ 4+    Shoulder extension      Shoulder abduction 4+ 4- 4 4  Shoulder adduction      Shoulder internal rotation      Shoulder external rotation      Middle trapezius 4- 4 4 4   Lower trapezius 3- 4- 3- 4  Elbow flexion      Elbow extension      Wrist flexion      Wrist extension      Wrist ulnar deviation      Wrist radial deviation      Wrist pronation      Wrist supination      Grip strength (lbs)      (Blank rows = not tested)  SHOULDER SPECIAL TESTS: Impingement tests: Painful arc test: positive  and   SLAP lesions:  NT  Rotator cuff assessment: Drop arm test: negative, Empty can test: Not performed , Full can test: negative, Belly press test: positive , and Infraspinatus test: negative Biceps assessment: Speed's test: positive   JOINT MOBILITY TESTING:  NT   PALPATION:  Right supra and infraspinatus TTP    TODAY'S TREATMENT:  DATE:    09/20/23:  Shoulder Flexion/ Extension AAROM Pulleys  3 x 10  Shoulder Flexion AAROM Pulley with eccentric control 1 x 10  Shoulder Abduction/Adduction AAROM Pulleys 3 x 10  Standing Shoulder Rows on RUE #5 3 x 10  Shoulder External Rotation on RUE 3 x 30 sec  Shoulder External Rotation stretch in doorway on RUE 3 x 30 sec  Shoulder External Rotation stretch using wall on RUE 3 x 30 sec  Shoulder Internal Rotation stretch with green stretch belt 3 x 30 sec  Shoulder Internal Rotation stretch with use of other hand 3 x 30 sec   09/07/23: THEREX  UBE 2.5 min forward and 2.5 min back with resistance of 3   Shoulder  AROM  -Flexion R 120 -Abduction R 110  Supine R Shoulder Flexion AAROM with PVC and #5 Ankle Weight 1 x 10  Supine R Shoulder Abduction AAROM with PVC 1 x 10  R Shoulder Flexion to 120 deg with #1 DB 1 x 10  Supine R Shoulder Flexion to 90 deg  1 x 10    MANUAL  R Shoulder Inferior glides grade III-IV x 30  R Shoulder Anterior glides grade II-III x 30   08/23/23: UBE seat at 10 -2.5 forward and 2.5 backward  Shoulder and Periscapular MMT: -Flex R/L 4+/4+ -Abd R/L 4-/4+ -Lower Trap R/L 3- -Mid Trap R/L 4-  Shoulder A/PROM  -Flexion R 125/145* -Abduction R 80/85   Shoulder Abduction AROM RUE Wall Slides 1 x 10  Shoulder External Rotation Stretch with towel 3 x 30 sec   Side Lying Shoulder Abduction with RUE with 3-4 sec hold at terminal ROM 1 x 10  Standing Single UE Shoulder Rows Red band 1 x 10  Standing Single UE Shoulder Rows Green band 1 x 10  Standing Single UE Shoulder Rows Blue band 1 x 10     PATIENT EDUCATION: Education details: form and technique for correct performance of exercise  Person educated: Patient Education method: Programmer, multimedia, Facilities manager, Verbal cues, and Handouts Education comprehension: verbalized understanding, returned demonstration, and verbal cues required  HOME EXERCISE PROGRAM: Access Code: [email or web address] URL: [email or web address] Date: 09/20/2023 Prepared by: Ellin Goodie  Exercises - Seated Shoulder Flexion AAROM with Pulley Behind  - 1 x daily - 3 sets - 10 reps - Seated Shoulder Abduction AAROM with Pulley Behind  - 1 x daily - 3 sets - 10 reps - Shoulder ER Stretch in Abduction  - 1 x daily - 3 reps - 30 sec  hold - Standing Shoulder Internal Rotation Stretch Behind Back  - 1 x daily - 3 reps - 30 sec hold - Standing Shoulder Flexion to 180 degrees with dumbbells   - 3-4 x weekly - 3 sets - 10 reps - Shoulder Abduction with Dumbbells - Thumbs Up  - 3-4 x weekly - 3 sets - 10 reps  ASSESSMENT:  CLINICAL IMPRESSION     Pt shows improvement with right shoulder AROM with increased shoulder abduction. Home exercises progressed to include weighted shoulder flexion and abduction within available ROM and shoulder AROM in gravity neutral position from gravity aided position. 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   OBJECTIVE IMPAIRMENTS: decreased ROM, decreased strength, impaired UE functional use, and pain.   ACTIVITY LIMITATIONS:  carrying, lifting, reach over head, and hygiene/grooming  PARTICIPATION LIMITATIONS: shopping and occupation  PERSONAL FACTORS: Time since onset of injury/illness/exacerbation and 1-2 comorbidities: Sinus Tachycardia   are also affecting patient's functional outcome.   REHAB POTENTIAL: Good  CLINICAL DECISION MAKING: Stable/uncomplicated  EVALUATION COMPLEXITY: Low   GOALS: Goals reviewed with patient? No  SHORT TERM GOALS: Target date: 06/30/2023  Pt will be independent with HEP in order to improve strength and balance in order to decrease fall risk and improve function at home and work. Baseline: NT 07/07/23: Performing Independently  Goal status: ACHIEVED   2.  Patient will identify UE activities at job or at home that she needs to modify to avoid overuse of right shoulder to improve RUE function  Baseline: NT 08/05/23: Able to perform independently  Goal status: ACHIEVED     LONG TERM GOALS: Target date: 08/25/2023  Patient will have improved function and activity level as evidenced by an increase in FOTO score by 10 points or more.  Baseline: 60/100 with target of 72  07/19/23: 56  08/19/23: 63  Goal status: ONGOING   2.  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  Baseline: Shoulder Flex R/L 120/180, Shoulder Abd R/L 90/180 07/21/23: Shoulder Flex R 122 Abd R 110 09/07/23: Shoulder AROM Flex R 120, Abd R 110  Goal status: ONGOING   3.  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  Baseline:  Shoulder Abd R/L 4-/4+, Mid Trap R/L 4/4-  07/21/23: Shoulder Abd R 4, Shoulder Mid Trap 4- Goal status: ONGOING     PLAN:  PT FREQUENCY: 1-2x/week  PT DURATION: 10 weeks  PLANNED INTERVENTIONS: Therapeutic exercises, Therapeutic activity, Neuromuscular re-education, Gait training, Patient/Family education, Self Care, Joint mobilization, Joint manipulation, Aquatic Therapy, Dry Needling, Electrical stimulation, Spinal manipulation, Spinal mobilization, Cryotherapy, Moist heat, Manual therapy, and Re-evaluation  PLAN FOR NEXT SESSION: Focus on periscapular strengthening: shoulder horizontal abduction and shoulder rows   Ellin Goodie, PT, DPT  [facility] [address] [place], [address] Phone: [phone]   Fax:  [phone]

## 2023-09-23 ENCOUNTER — Other Ambulatory Visit: Payer: Self-pay | Admitting: Family

## 2023-09-23 DIAGNOSIS — E118 Type 2 diabetes mellitus with unspecified complications: Secondary | ICD-10-CM

## 2023-09-30 ENCOUNTER — Encounter: Payer: Self-pay | Admitting: Family

## 2023-09-30 ENCOUNTER — Ambulatory Visit (INDEPENDENT_AMBULATORY_CARE_PROVIDER_SITE_OTHER): Payer: 59 | Admitting: Family

## 2023-09-30 VITALS — BP 124/82 | HR 73 | Temp 98.2°F | Ht 63.75 in | Wt 135.0 lb

## 2023-09-30 DIAGNOSIS — J301 Allergic rhinitis due to pollen: Secondary | ICD-10-CM

## 2023-09-30 DIAGNOSIS — Z13220 Encounter for screening for lipoid disorders: Secondary | ICD-10-CM | POA: Diagnosis not present

## 2023-09-30 DIAGNOSIS — G8929 Other chronic pain: Secondary | ICD-10-CM

## 2023-09-30 DIAGNOSIS — E118 Type 2 diabetes mellitus with unspecified complications: Secondary | ICD-10-CM | POA: Diagnosis not present

## 2023-09-30 DIAGNOSIS — R748 Abnormal levels of other serum enzymes: Secondary | ICD-10-CM

## 2023-09-30 DIAGNOSIS — H04203 Unspecified epiphora, bilateral lacrimal glands: Secondary | ICD-10-CM | POA: Insufficient documentation

## 2023-09-30 DIAGNOSIS — R7989 Other specified abnormal findings of blood chemistry: Secondary | ICD-10-CM

## 2023-09-30 DIAGNOSIS — Z7985 Long-term (current) use of injectable non-insulin antidiabetic drugs: Secondary | ICD-10-CM | POA: Diagnosis not present

## 2023-09-30 DIAGNOSIS — I10 Essential (primary) hypertension: Secondary | ICD-10-CM

## 2023-09-30 DIAGNOSIS — M25511 Pain in right shoulder: Secondary | ICD-10-CM

## 2023-09-30 LAB — LIPID PANEL
Cholesterol: 161 mg/dL (ref 0–200)
HDL: 66.7 mg/dL (ref >39.00)
LDL Cholesterol: 80 mg/dL (ref 0–99)
NonHDL: 93.86
Total CHOL/HDL Ratio: 2
Triglycerides: 70 mg/dL (ref 0.0–149.0)
VLDL: 14 mg/dL (ref 0.0–40.0)

## 2023-09-30 LAB — HEMOGLOBIN A1C: Hgb A1c MFr Bld: 7.3 % — ABNORMAL HIGH (ref 4.6–6.5)

## 2023-09-30 LAB — MAGNESIUM: Magnesium: 1.6 mg/dL (ref 1.5–2.5)

## 2023-09-30 MED ORDER — FLUTICASONE PROPIONATE 50 MCG/ACT NA SUSP
2.0000 | Freq: Every day | NASAL | 6 refills | Status: DC
Start: 2023-09-30 — End: 2024-09-02

## 2023-09-30 NOTE — Assessment & Plan Note (Signed)
Cont physical therapy and f/u with ortho as scheduled.

## 2023-09-30 NOTE — Assessment & Plan Note (Signed)
May be associated with eye watering Start flonase along with astelin, also consider saline.

## 2023-09-30 NOTE — Assessment & Plan Note (Signed)
Cont daily supplementation  Checking today

## 2023-09-30 NOTE — Assessment & Plan Note (Signed)
Repeat today

## 2023-09-30 NOTE — Progress Notes (Signed)
Established Patient Office Visit  Subjective:      CC:  Chief Complaint  Patient presents with   Diabetes    HPI: Kayla Erickson is a 42 y.o. female presenting on 09/30/2023 for Diabetes . Just started a new job working at [employer]. 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   DM2: on jardiance 25 mg once daily, metformin 1000 mg twice daily and trulicity 3 mg  Right shoulder stiffness/frozen shoulder, still doing some physical therapy with only mild improvement. Seeing orthopedist at [facility]  New complaints: Still with ongoing irritation to eye with frequent clear discharge, water often. She does live with a lot of animals which might be aggravating this. She does take astelin every once in a while.   ADD: stopped strattera because it was making her lose too much weight, has since improved starting to eat more.  Wt Readings from Last 3 Encounters:  09/30/23 135 lb (61.2 kg)  05/04/23 129 lb 6 oz (58.7 kg)  02/04/23 127 lb 9.6 oz (57.9 kg)    Last metabolic panel Lab Results  Component Value Date   GLUCOSE 195 (H) 06/15/2022   NA 135 06/15/2022   K 4.4 06/15/2022   CL 96 06/15/2022   CO2 31 06/15/2022   BUN 27 (H) 06/15/2022   CREATININE 1.36 (H) 06/15/2022   GFR 49.04 (L) 06/15/2022   CALCIUM 10.5 06/15/2022   PHOS 3.7 04/30/2022   PROT 7.1 02/04/2023   ALBUMIN 4.4 02/04/2023   LABGLOB 2.0 02/02/2017   AGRATIO 2.0 02/02/2017   BILITOT 0.5 02/04/2023   ALKPHOS 114 02/04/2023   AST 42 (H) 02/04/2023   ALT 64 (H) 02/04/2023   ANIONGAP 9 01/19/2017     Social history:  Relevant past medical, surgical, family and social history reviewed and updated as indicated. Interim medical history since our last visit reviewed.  Allergies and medications reviewed and updated.  DATA REVIEWED: CHART IN EPIC     ROS: Negative unless specifically indicated above in HPI.    Current Outpatient Medications:    Azelastine HCl 137  MCG/SPRAY SOLN, PLACE 1 SPRAY INTO BOTH NOSTRILS 2 (TWO) TIMES DAILY. USE IN EACH NOSTRIL AS DIRECTED, Disp: 30 mL, Rfl: 12   empagliflozin (JARDIANCE) 25 MG TABS tablet, Take 1 tablet (25 mg total) by mouth daily before breakfast., Disp: 90 tablet, Rfl: 3   fluticasone (FLONASE) 50 MCG/ACT nasal spray, Place 2 sprays into both nostrils daily., Disp: 16 g, Rfl: 6   lisinopril (ZESTRIL) 10 MG tablet, TAKE 1 TABLET BY MOUTH EVERY DAY, Disp: 90 tablet, Rfl: 3   magnesium oxide (MAG-OX) 400 (240 Mg) MG tablet, Take 1 tablet (400 mg total) by mouth 2 (two) times daily., Disp: 180 tablet, Rfl: 1   metFORMIN (GLUCOPHAGE) 1000 MG tablet, TAKE 1 TABLET (1,000 MG TOTAL) BY MOUTH TWICE A DAY WITH FOOD, Disp: 60 tablet, Rfl: 0   TRULICITY 3 MG/0.5ML SOAJ, Inject 3 mg as directed once a week., Disp: , Rfl:       Objective:    BP 124/82 (BP Location: Left Arm, Patient Position: Sitting, Cuff Size: Normal)   Pulse 73   Temp 98.2 F (36.8 C) (Temporal)   Ht 5' 3.75" (1.619 m)   Wt 135 lb (61.2 kg)   LMP 09/09/2023   SpO2 100%   BMI 23.35 kg/m   Wt Readings from Last 3 Encounters:  09/30/23 135 lb (61.2 kg)  05/04/23 129 lb 6 oz (  58.7 kg)  02/04/23 127 lb 9.6 oz (57.9 kg)    Physical Exam Constitutional:      General: She is not in acute distress.    Appearance: Normal appearance. She is normal weight. She is not ill-appearing, toxic-appearing or diaphoretic.  HENT:     Head: Normocephalic.  Eyes:     General: Lids are normal. No allergic shiner or visual field deficit.       Right eye: Discharge (clear) present.        Left eye: Discharge (clear) present. Cardiovascular:     Rate and Rhythm: Normal rate and regular rhythm.  Pulmonary:     Effort: Pulmonary effort is normal.     Breath sounds: Normal breath sounds.  Musculoskeletal:        General: Normal range of motion.  Neurological:     General: No focal deficit present.     Mental Status: She is alert and oriented to person, place,  and time. Mental status is at baseline.  Psychiatric:        Mood and Affect: Mood normal.        Behavior: Behavior normal.        Thought Content: Thought content normal.        Judgment: Judgment normal.           Assessment & Plan:  Essential hypertension  Hypomagnesemia Assessment & Plan: Cont daily supplementation  Checking today   Orders: -     Magnesium  Diabetes mellitus type 2 with complications (HCC) Assessment & Plan: Compliant.  A1c today pending results.  Cont trulicity 3 mg weekly and jardiance 25 and metformin 1000 mg bid  Work on diabetic diet and exercise as tolerated. Yearly foot exam, and annual eye exam.     Orders: -     Hemoglobin A1c -     Lipid panel  Elevated creatine kinase  Screening for lipoid disorders -     Lipid panel  Seasonal allergic rhinitis due to pollen Assessment & Plan: May be associated with eye watering Start flonase along with astelin, also consider saline.   Orders: -     Fluticasone Propionate; Place 2 sprays into both nostrils daily.  Dispense: 16 g; Refill: 6  Chronic pain in right shoulder Assessment & Plan: Cont physical therapy and f/u with ortho as scheduled.    Elevated liver function tests Assessment & Plan: Repeat today   Bilateral epiphora Assessment & Plan: Suspect to be allergic  Start flonase along with astelin  However make appt to establish with eye doc to evaluate further as chronic, stable.  Also see eye doc for diabetic retinopathy assessment      Return in about 6 months (around 03/30/2024) for f/u diabetes.  Mort Sawyers, MSN, APRN, FNP-C [facility] [facility]

## 2023-09-30 NOTE — Assessment & Plan Note (Signed)
Compliant.  A1c today pending results.  Cont trulicity 3 mg weekly and jardiance 25 and metformin 1000 mg bid  Work on diabetic diet and exercise as tolerated. Yearly foot exam, and annual eye exam.

## 2023-09-30 NOTE — Assessment & Plan Note (Signed)
Suspect to be allergic  Start flonase along with astelin  However make appt to establish with eye doc to evaluate further as chronic, stable.  Also see eye doc for diabetic retinopathy assessment

## 2023-09-30 NOTE — Patient Instructions (Signed)
  [facility]

## 2023-10-01 MED ORDER — TRULICITY 4.5 MG/0.5ML ~~LOC~~ SOAJ
4.5000 mg | SUBCUTANEOUS | 1 refills | Status: DC
Start: 2023-10-01 — End: 2024-03-20

## 2023-10-05 ENCOUNTER — Encounter: Payer: Self-pay | Admitting: Physical Therapy

## 2023-10-05 ENCOUNTER — Ambulatory Visit: Payer: 59 | Admitting: Physical Therapy

## 2023-10-05 DIAGNOSIS — G8929 Other chronic pain: Secondary | ICD-10-CM | POA: Diagnosis not present

## 2023-10-05 DIAGNOSIS — M25511 Pain in right shoulder: Secondary | ICD-10-CM | POA: Diagnosis not present

## 2023-10-05 NOTE — Therapy (Signed)
OUTPATIENT PHYSICAL THERAPY SHOULDER TREATMENT    Patient Name: Kayla Erickson MRN: [id number] DOB:1981/07/03, 42 y.o., female Today's Date: 10/05/2023  END OF SESSION:  PT End of Session - 10/05/23 0826     Visit Number 14    Number of Visits 20    Date for PT Re-Evaluation 10/26/23    Authorization Type [employer] 2024 30 OT/PT    Authorization - Visit Number 14    Authorization - Number of Visits 20    Progress Note Due on Visit 10    PT Start Time 0820    PT Stop Time 0900    PT Time Calculation (min) 40 min    Activity Tolerance Patient tolerated treatment well    Behavior During Therapy [facility] for tasks assessed/performed                  Past Medical History:  Diagnosis Date   Allergic rhinitis    Diabetes mellitus type 2 with complications (HCC)    DKA (diabetic ketoacidoses) 01/15/2017   Hypertension    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)    SINUS TACHYCARDIA 11/24/2007   Qualifier: Diagnosis of  By: Gwenlyn Perking MD, Carlos     Urine test positive for microalbuminuria 04/28/2022   Past Surgical History:  Procedure Laterality Date   WISDOM TOOTH EXTRACTION     Patient Active Problem List   Diagnosis Date Noted   Bilateral epiphora 09/30/2023   Chronic pain in right shoulder 05/04/2023   Seasonal allergic rhinitis due to pollen 06/15/2022   Elevated liver function tests 04/28/2022   Hypomagnesemia 04/28/2022   Attention deficit hyperactivity disorder (ADHD), predominantly inattentive type 04/28/2022   Diabetic retinopathy (HCC) 03/28/2013   Diabetes mellitus type 2 with complications (HCC)    Essential hypertension 03/25/2010    PCP: Dr. Mort Sawyers   REFERRING PROVIDER: Dr. Jari Sportsman   REFERRING DIAG: [phone] (ICD-10-CM) - Chronic pain in right shoulder  THERAPY DIAG:  Chronic right shoulder pain  Rationale for Evaluation and Treatment: Rehabilitation  ONSET DATE: 12/16/2022   SUBJECTIVE:                                                                                                                                                                                       SUBJECTIVE STATEMENT:  Pt reports feeling increased soreness in her right shoulder and she is not sure why. She has observed starting to use her right shoulder more.   Hand dominance: Right  PERTINENT HISTORY: Pt reports that her right shoulder pain started when pushing a heavy shopping cart at [employer] which was 6 mo ago. Her shoulder  has since pained her. 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 No  PRECAUTIONS: None  RED FLAGS: None   WEIGHT BEARING RESTRICTIONS: No  FALLS:  Has patient fallen in last 6 months? No  OCCUPATION: Barista at [employer]   PLOF: Independent  PATIENT GOALS:Reach up her head  NEXT MD VISIT: Not sure   OBJECTIVE:   VITALS: BP 98/66 HR 120 SpO2 100%  DIAGNOSTIC FINDINGS:  CLINICAL DATA:  Right shoulder pain   EXAM: RIGHT SHOULDER - 2+ VIEW   COMPARISON:  None Available.   FINDINGS: There is no evidence of fracture or dislocation. There is no evidence of arthropathy or other focal bone abnormality. Soft tissues are unremarkable.   IMPRESSION: Negative.     Electronically Signed   By: Gerome Sam III M.D.   On: 02/04/2023 20:27    PATIENT SURVEYS:  FOTO 60 with target of 72   COGNITION: Overall cognitive status: Within functional limits for tasks assessed     SENSATION: WFL  POSTURE: No deficits   UPPER EXTREMITY ROM:   Active /Passive ROM Right eval Left eval Right  08/23/23 Left 08/23/23  Shoulder flexion 120*/120* 180/180 125/145*   Shoulder extension      Shoulder abduction 90*/110* 180/180 80/85*    Shoulder adduction      Shoulder internal rotation      Shoulder external rotation      Elbow flexion      Elbow extension      Wrist flexion       Wrist extension      Wrist ulnar deviation      Wrist radial deviation      Wrist pronation      Wrist supination      (Blank rows = not tested)         UPPER EXTREMITY MMT:  MMT Right eval Left eval Right  08/23/23 Left 09/12/23   Shoulder flexion 4+ 4+    Shoulder extension      Shoulder abduction 4+ 4- 4 4  Shoulder adduction      Shoulder internal rotation      Shoulder external rotation      Middle trapezius 4- 4 4 4   Lower trapezius 3- 4- 3- 4  Elbow flexion      Elbow extension      Wrist flexion      Wrist extension      Wrist ulnar deviation      Wrist radial deviation      Wrist pronation      Wrist supination      Grip strength (lbs)      (Blank rows = not tested)  SHOULDER SPECIAL TESTS: Impingement tests: Painful arc test: positive  and   SLAP lesions:  NT  Rotator cuff assessment: Drop arm test: negative, Empty can test: Not performed , Full can test: negative, Belly press test: positive , and Infraspinatus test: negative Biceps assessment: Speed's test: positive   JOINT MOBILITY TESTING:  NT   PALPATION:  Right supra and infraspinatus TTP    TODAY'S TREATMENT:  DATE:    10/05/23: UBE with seat at 6 for 2.5 min forward, 2.5 backward  OMEGA Seated Rows #20 1 x 10  OMEGA Seated Rows #25 1 x 10   Shoulder Pulleys AAROM Flexion/Extension 3 x 10  Shoulder Pulleys AAROM Abduction/Adduction 3 x 10  Shoulder Flexion with #1 DB 2 x 10  -min VC to retract shoulder blades  Shoulder Flexion with #2 DB 2 x 10  Shoulder Abduction with #1 DB 3 x 10  -min VC to increase scapular retraction to decrease shoulder elevation Shoulder ER on RUE on doorway 3 x 30 sec  Shoulder IR stretch with towel 3 x 30 sec   09/20/23:  Shoulder Flexion/ Extension AAROM Pulleys  3 x 10  Shoulder Flexion AAROM Pulley with eccentric control  1 x 10  Shoulder Abduction/Adduction AAROM Pulleys 3 x 10  Standing Shoulder Rows on RUE #5 3 x 10  Shoulder External Rotation on RUE 3 x 30 sec  Shoulder External Rotation stretch in doorway on RUE 3 x 30 sec  Shoulder External Rotation stretch using wall on RUE 3 x 30 sec  Shoulder Internal Rotation stretch with green stretch belt 3 x 30 sec  Shoulder Internal Rotation stretch with use of other hand 3 x 30 sec   09/07/23: THEREX  UBE 2.5 min forward and 2.5 min back with resistance of 3   Shoulder AROM  -Flexion R 120 -Abduction R 110  Supine R Shoulder Flexion AAROM with PVC and #5 Ankle Weight 1 x 10  Supine R Shoulder Abduction AAROM with PVC 1 x 10  R Shoulder Flexion to 120 deg with #1 DB 1 x 10  Supine R Shoulder Flexion to 90 deg  1 x 10    MANUAL  R Shoulder Inferior glides grade III-IV x 30  R Shoulder Anterior glides grade II-III x 30    PATIENT EDUCATION: Education details: form and technique for correct performance of exercise  Person educated: Patient Education method: Explanation, Demonstration, Verbal cues, and Handouts Education comprehension: verbalized understanding, returned demonstration, and verbal cues required  HOME EXERCISE PROGRAM: Access Code: [email or web address] URL: [email or web address] Date: 10/05/2023 Prepared by: Ellin Goodie  Exercises - Seated Shoulder Flexion AAROM with Pulley Behind  - 1 x daily - 3 sets - 10 reps - Seated Shoulder Abduction AAROM with Pulley Behind  - 1 x daily - 3 sets - 10 reps - Shoulder ER Stretch in Abduction  - 3-4 x daily - 3 reps - 30 sec  hold - Standing Shoulder Internal Rotation Stretch Behind Back  - 3-4 x daily - 3 reps - 30 sec hold - Standing Shoulder Flexion to 180 degrees with dumbbells   - 3-4 x weekly - 3 sets - 10 reps - Shoulder Abduction with Dumbbells - Thumbs Up  - 3-4 x weekly - 3 sets - 10 reps  ASSESSMENT:  CLINICAL IMPRESSION    Pt continues to demonstrate decreased right  shoulder ROM as well as compensation with shoulder ROM above 90 degrees for flexion and abduction with upper trap activation. She does show an improvement in shoulder mechanics with min VC for scapular retraction cueing to allow for greater shoulder stability.  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  OBJECTIVE IMPAIRMENTS: decreased ROM, decreased strength, impaired  UE functional use, and pain.   ACTIVITY LIMITATIONS: carrying, lifting, reach over head, and hygiene/grooming  PARTICIPATION LIMITATIONS: shopping and occupation  PERSONAL FACTORS: Time since onset of injury/illness/exacerbation and 1-2 comorbidities: Sinus Tachycardia   are also affecting patient's functional outcome.   REHAB POTENTIAL: Good  CLINICAL DECISION MAKING: Stable/uncomplicated  EVALUATION COMPLEXITY: Low   GOALS: Goals reviewed with patient? No  SHORT TERM GOALS: Target date: 06/30/2023  Pt will be independent with HEP in order to improve strength and balance in order to decrease fall risk and improve function at home and work. Baseline: NT 07/07/23: Performing Independently  Goal status: ACHIEVED   2.  Patient will identify UE activities at job or at home that she needs to modify to avoid overuse of right shoulder to improve RUE function  Baseline: NT 08/05/23: Able to perform independently  Goal status: ACHIEVED     LONG TERM GOALS: Target date: 10/26/23  Patient will have improved function and activity level as evidenced by an increase in FOTO score by 10 points or more.  Baseline: 60/100 with target of 72  07/19/23: 56  08/19/23: 63  Goal status: ONGOING   2.  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  Baseline: Shoulder Flex R/L 120/180, Shoulder Abd R/L 90/180 07/21/23: Shoulder Flex R 122 Abd R 110 09/07/23: Shoulder AROM Flex R 120, Abd R 110  Goal status: ONGOING   3.  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  Baseline:  Shoulder Abd R/L 4-/4+, Mid Trap R/L 4/4-  07/21/23: Shoulder Abd R 4, Shoulder Mid Trap 4- Goal status: ONGOING     PLAN:  PT FREQUENCY: 1-2x/week  PT DURATION: 10 weeks  PLANNED INTERVENTIONS: Therapeutic exercises, Therapeutic activity, Neuromuscular re-education, Gait training, Patient/Family education, Self Care, Joint mobilization, Joint manipulation, Aquatic Therapy, Dry Needling, Electrical stimulation, Spinal manipulation, Spinal mobilization, Cryotherapy, Moist heat, Manual therapy, and Re-evaluation  PLAN FOR NEXT SESSION: Reassess goals. Focus on periscapular strengthening: shoulder horizontal abduction and bent over Y's   Ellin Goodie, PT, DPT  [facility] [address], [place], [address] Phone: [phone]   Fax:  [phone]

## 2023-10-26 ENCOUNTER — Other Ambulatory Visit: Payer: Self-pay | Admitting: Family

## 2023-10-26 ENCOUNTER — Ambulatory Visit: Payer: No Typology Code available for payment source | Admitting: Physical Therapy

## 2023-10-26 DIAGNOSIS — E118 Type 2 diabetes mellitus with unspecified complications: Secondary | ICD-10-CM

## 2023-10-27 ENCOUNTER — Other Ambulatory Visit: Payer: Self-pay | Admitting: Family

## 2023-10-27 DIAGNOSIS — E118 Type 2 diabetes mellitus with unspecified complications: Secondary | ICD-10-CM

## 2023-10-28 ENCOUNTER — Other Ambulatory Visit (HOSPITAL_COMMUNITY): Payer: Self-pay

## 2023-10-28 ENCOUNTER — Encounter: Payer: Self-pay | Admitting: Family

## 2023-10-28 ENCOUNTER — Telehealth: Payer: Self-pay | Admitting: Family

## 2023-10-28 NOTE — Telephone Encounter (Signed)
 Pharmacy Patient Advocate Encounter  Insurance verification completed.   The patient is insured through [employer].  Ran test claim for Trulicity  4.5mg . Currently a quantity of 2 is a 28 day supply and the co-pay is $25 .   This test claim was processed through [facility]- copay amounts may vary at other pharmacies due to pharmacy/plan contracts, or as the patient moves through the different stages of their insurance plan.    Spoke to pharmacy and they are processing the prescription.

## 2023-10-28 NOTE — Telephone Encounter (Signed)
 Noted.

## 2023-11-01 ENCOUNTER — Ambulatory Visit: Payer: No Typology Code available for payment source | Attending: Physician Assistant | Admitting: Physical Therapy

## 2023-11-01 DIAGNOSIS — M25511 Pain in right shoulder: Secondary | ICD-10-CM | POA: Insufficient documentation

## 2023-11-01 DIAGNOSIS — G8929 Other chronic pain: Secondary | ICD-10-CM | POA: Insufficient documentation

## 2023-11-01 NOTE — Therapy (Addendum)
 OUTPATIENT PHYSICAL THERAPY SHOULDER SCREEN    Patient Name: Kayla Erickson MRN: [id number] DOB:1981/03/18, 43 y.o., female Today's Date: 11/01/2023  END OF SESSION:  PT End of Session - 11/01/23 1655     Visit Number 14    Number of Visits 20    Date for PT Re-Evaluation 10/26/23    Authorization Type [employer]    Authorization - Visit Number 14    Authorization - Number of Visits 15    Progress Note Due on Visit 15    PT Start Time 1650    PT Stop Time 1730    PT Time Calculation (min) 40 min    Activity Tolerance Patient tolerated treatment well    Behavior During Therapy [facility] for tasks assessed/performed                   Past Medical History:  Diagnosis Date   Allergic rhinitis    Diabetes mellitus type 2 with complications (HCC)    DKA (diabetic ketoacidoses) 01/15/2017   Hypertension    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)    SINUS TACHYCARDIA 11/24/2007   Qualifier: Diagnosis of  By: Ricky MD, Carlos     Urine test positive for microalbuminuria 04/28/2022   Past Surgical History:  Procedure Laterality Date   WISDOM TOOTH EXTRACTION     Patient Active Problem List   Diagnosis Date Noted   Bilateral epiphora 09/30/2023   Chronic pain in right shoulder 05/04/2023   Seasonal allergic rhinitis due to pollen 06/15/2022   Elevated liver function tests 04/28/2022   Hypomagnesemia 04/28/2022   Attention deficit hyperactivity disorder (ADHD), predominantly inattentive type 04/28/2022   Diabetic retinopathy (HCC) 03/28/2013   Diabetes mellitus type 2 with complications (HCC)    Essential hypertension 03/25/2010    PCP: Dr. Ginger Patrick   REFERRING PROVIDER: Dr. Ronal Grave   REFERRING DIAG: [phone] (ICD-10-CM) - Chronic pain in right shoulder  THERAPY DIAG:  No diagnosis found.  Rationale for Evaluation and Treatment: Rehabilitation  ONSET DATE: 12/16/2022   SUBJECTIVE:                                                                                                                                                                                       SUBJECTIVE STATEMENT:  Pt reports feeling increased soreness in her right shoulder and she is not sure why. She has observed starting to use her right shoulder more. Pt states that she has new insurance that started this year and she is not sure whether the insurance covers physical therapy. PT recommended that pt find out insurance benefits before continuing with physical  therapy to avoid having to pay for session out of pocket.   Hand dominance: Right  PERTINENT HISTORY: Pt reports that her right shoulder pain started when pushing a heavy shopping cart at [employer] which was 6 mo ago. Her shoulder has since pained her. 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    ASSESSMENT: PT to terminate session due to concern about pt's financial liability. She will verify insurance by calling number on the back of her card as soon as possible.

## 2023-11-04 ENCOUNTER — Ambulatory Visit: Payer: No Typology Code available for payment source | Admitting: Physical Therapy

## 2023-11-04 DIAGNOSIS — G8929 Other chronic pain: Secondary | ICD-10-CM | POA: Diagnosis present

## 2023-11-04 DIAGNOSIS — M25511 Pain in right shoulder: Secondary | ICD-10-CM | POA: Diagnosis present

## 2023-11-04 NOTE — Therapy (Signed)
OUTPATIENT PHYSICAL THERAPY SHOULDER DISCHARGE    Patient Name: Kayla Erickson MRN: [id number] DOB:04-26-1981, 43 y.o., female Today's Date: 11/04/2023  END OF SESSION:  PT End of Session - 11/04/23 0953     Visit Number 15    Number of Visits 20    Date for PT Re-Evaluation 10/26/23    Authorization Type [employer] 2025    Authorization - Visit Number 15    Authorization - Number of Visits 20    Progress Note Due on Visit 20    PT Start Time 0950    PT Stop Time 1030    PT Time Calculation (min) 40 min    Activity Tolerance Patient tolerated treatment well    Behavior During Therapy [facility] for tasks assessed/performed                  Past Medical History:  Diagnosis Date   Allergic rhinitis    Diabetes mellitus type 2 with complications (HCC)    DKA (diabetic ketoacidoses) 01/15/2017   Hypertension    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)    SINUS TACHYCARDIA 11/24/2007   Qualifier: Diagnosis of  By: Gwenlyn Perking MD, Carlos     Urine test positive for microalbuminuria 04/28/2022   Past Surgical History:  Procedure Laterality Date   WISDOM TOOTH EXTRACTION     Patient Active Problem List   Diagnosis Date Noted   Bilateral epiphora 09/30/2023   Chronic pain in right shoulder 05/04/2023   Seasonal allergic rhinitis due to pollen 06/15/2022   Elevated liver function tests 04/28/2022   Hypomagnesemia 04/28/2022   Attention deficit hyperactivity disorder (ADHD), predominantly inattentive type 04/28/2022   Diabetic retinopathy (HCC) 03/28/2013   Diabetes mellitus type 2 with complications (HCC)    Essential hypertension 03/25/2010    PCP: Dr. Mort Sawyers   REFERRING PROVIDER: Dr. Jari Sportsman   REFERRING DIAG: [phone] (ICD-10-CM) - Chronic pain in right shoulder  THERAPY DIAG:  No diagnosis found.  Rationale for Evaluation and Treatment: Rehabilitation  ONSET DATE: 12/16/2022   SUBJECTIVE:                                                                                                                                                                                       SUBJECTIVE STATEMENT:  Pt states that she continues to be able to use shoulder without much difficulty with the exception of placing objects on cabinet for her job.   Hand dominance: Right  PERTINENT HISTORY: Pt reports that her right shoulder pain started when pushing a heavy shopping cart at [employer] which was 6 mo ago. Her shoulder has since pained  her. 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 No  PRECAUTIONS: None  RED FLAGS: None   WEIGHT BEARING RESTRICTIONS: No  FALLS:  Has patient fallen in last 6 months? No  OCCUPATION: Barista at [employer]   PLOF: Independent  PATIENT GOALS:Reach up her head  NEXT MD VISIT: Not sure   OBJECTIVE:   VITALS: BP 98/66 HR 120 SpO2 100%  DIAGNOSTIC FINDINGS:  CLINICAL DATA:  Right shoulder pain   EXAM: RIGHT SHOULDER - 2+ VIEW   COMPARISON:  None Available.   FINDINGS: There is no evidence of fracture or dislocation. There is no evidence of arthropathy or other focal bone abnormality. Soft tissues are unremarkable.   IMPRESSION: Negative.     Electronically Signed   By: Gerome Sam III M.D.   On: 02/04/2023 20:27    PATIENT SURVEYS:  FOTO 60 with target of 72   COGNITION: Overall cognitive status: Within functional limits for tasks assessed     SENSATION: WFL  POSTURE: No deficits   UPPER EXTREMITY ROM:   Active /Passive ROM Right eval Left eval Right  08/23/23 Left 08/23/23  Shoulder flexion 120*/120* 180/180 125/145*   Shoulder extension      Shoulder abduction 90*/110* 180/180 80/85*    Shoulder adduction      Shoulder internal rotation      Shoulder external rotation      Elbow flexion      Elbow extension      Wrist flexion      Wrist  extension      Wrist ulnar deviation      Wrist radial deviation      Wrist pronation      Wrist supination      (Blank rows = not tested)         UPPER EXTREMITY MMT:  MMT Right eval Left eval Right  08/23/23 Left 09/12/23   Shoulder flexion 4+ 4+    Shoulder extension      Shoulder abduction 4+ 4- 4 4  Shoulder adduction      Shoulder internal rotation      Shoulder external rotation      Middle trapezius 4- 4 4 4   Lower trapezius 3- 4- 3- 4  Elbow flexion      Elbow extension      Wrist flexion      Wrist extension      Wrist ulnar deviation      Wrist radial deviation      Wrist pronation      Wrist supination      Grip strength (lbs)      (Blank rows = not tested)  SHOULDER SPECIAL TESTS: Impingement tests: Painful arc test: positive  and   SLAP lesions:  NT  Rotator cuff assessment: Drop arm test: negative, Empty can test: Not performed , Full can test: negative, Belly press test: positive , and Infraspinatus test: negative Biceps assessment: Speed's test: positive   JOINT MOBILITY TESTING:  NT   PALPATION:  Right supra and infraspinatus TTP    TODAY'S TREATMENT:  DATE:    11/04/23: RUE exercises  UBE with seat at 9 -2.5 forward and 2.5 backward  Shoulder Flex R 140  Shoulder Abd R 110  MMT  Mid Trap R/L 4/4+  Shoulder Abd R 4 Supine Shoulder Flexion on RUE to end range with #3 DB 1 x 10  Left Side Lying Shoulder Abduction AROM on RUE 1 x 10  FOTO 62    10/05/23: UBE with seat at 6 for 2.5 min forward, 2.5 backward  OMEGA Seated Rows #20 1 x 10  OMEGA Seated Rows #25 1 x 10   Shoulder Pulleys AAROM Flexion/Extension 3 x 10  Shoulder Pulleys AAROM Abduction/Adduction 3 x 10  Shoulder Flexion with #1 DB 2 x 10  -min VC to retract shoulder blades  Shoulder Flexion with #2 DB 2 x 10  Shoulder Abduction with  #1 DB 3 x 10  -min VC to increase scapular retraction to decrease shoulder elevation Shoulder ER on RUE on doorway 3 x 30 sec  Shoulder IR stretch with towel 3 x 30 sec   09/20/23:  Shoulder Flexion/ Extension AAROM Pulleys  3 x 10  Shoulder Flexion AAROM Pulley with eccentric control 1 x 10  Shoulder Abduction/Adduction AAROM Pulleys 3 x 10  Standing Shoulder Rows on RUE #5 3 x 10  Shoulder External Rotation on RUE 3 x 30 sec  Shoulder External Rotation stretch in doorway on RUE 3 x 30 sec  Shoulder External Rotation stretch using wall on RUE 3 x 30 sec  Shoulder Internal Rotation stretch with green stretch belt 3 x 30 sec  Shoulder Internal Rotation stretch with use of other hand 3 x 30 sec   09/07/23: THEREX  UBE 2.5 min forward and 2.5 min back with resistance of 3   Shoulder AROM  -Flexion R 120 -Abduction R 110  Supine R Shoulder Flexion AAROM with PVC and #5 Ankle Weight 1 x 10  Supine R Shoulder Abduction AAROM with PVC 1 x 10  R Shoulder Flexion to 120 deg with #1 DB 1 x 10  Supine R Shoulder Flexion to 90 deg  1 x 10    MANUAL  R Shoulder Inferior glides grade III-IV x 30  R Shoulder Anterior glides grade II-III x 30    PATIENT EDUCATION: Education details: form and technique for correct performance of exercise  Person educated: Patient Education method: Explanation, Demonstration, Verbal cues, and Handouts Education comprehension: verbalized understanding, returned demonstration, and verbal cues required  HOME EXERCISE PROGRAM: Access Code: [email or web address] URL: [email or web address] Date: 11/04/2023 Prepared by: Ellin Goodie  Exercises - Seated Shoulder Flexion AAROM with Pulley Behind  - 1 x daily - 3 sets - 10 reps - Seated Shoulder Abduction AAROM with Pulley Behind  - 1 x daily - 3 sets - 10 reps - Shoulder ER Stretch in Abduction  - 3-4 x daily - 3 reps - 30 sec  hold - Standing Shoulder Internal Rotation Stretch Behind Back  - 3-4 x daily -  3 reps - 30 sec hold - Standing Shoulder Flexion to 180 degrees with dumbbells   - 3-4 x weekly - 3 sets - 10 reps - Shoulder Abduction with Dumbbells - Thumbs Up  - 3-4 x weekly - 3 sets - 10 reps - Supine Shoulder Flexion Extension Full Range AROM  - 1 x daily - 2 sets - 10 reps - Sidelying Shoulder Abduction Palm Forward  - 1 x daily - 3 sets - 10 reps  ASSESSMENT:  CLINICAL IMPRESSION    Pt continues to progress towards rehab goals at the rate typical for thawing phase with ongoing improvement in right shoulder ROM and strength. Despite ongoing deficits, pt is able to complete nearly all functional tasks with her right shoulder with the exception of reaching up to place glasses on shelf, which she has modified with using stool. She is now ready for discharge and she has been given a home exercise plan to continue to maintain ongoing progress towards goals.   OBJECTIVE IMPAIRMENTS: decreased ROM, decreased strength, impaired UE functional use, and pain.   ACTIVITY LIMITATIONS: carrying, lifting, reach over head, and hygiene/grooming  PARTICIPATION LIMITATIONS: shopping and occupation  PERSONAL FACTORS: Time since onset of injury/illness/exacerbation and 1-2 comorbidities: Sinus Tachycardia   are also affecting patient's functional outcome.   REHAB POTENTIAL: Good  CLINICAL DECISION MAKING: Stable/uncomplicated  EVALUATION COMPLEXITY: Low   GOALS: Goals reviewed with patient? No  SHORT TERM GOALS: Target date: 06/30/2023  Pt will be independent with HEP in order to improve strength and balance in order to decrease fall risk and improve function at home and work. Baseline: NT 07/07/23: Performing Independently  Goal status: ACHIEVED   2.  Patient will identify UE activities at job or at home that she needs to modify to avoid overuse of right shoulder to improve RUE function  Baseline: NT 08/05/23: Able to perform independently  Goal status: ACHIEVED     LONG TERM GOALS: Target  date: 10/26/23  Patient will have improved function and activity level as evidenced by an increase in FOTO score by 10 points or more.  Baseline: 60/100 with target of 72  07/19/23: 56  08/19/23: 63 11/04/23: 62  Goal status: NOT MET   2.  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  Baseline: Shoulder Flex R/L 120/180, Shoulder Abd R/L 90/180 07/21/23: Shoulder Flex R 122 Abd R 110 09/07/23: Shoulder AROM Flex R 120, Abd R 110 11/04/23: Shoulder Flex R 140, Shoulder Abd R 110 Goal status: NOT MET   3.  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  Baseline:  Shoulder Abd R/L 4-/4+, Mid Trap R/L 4/4-  07/21/23: Shoulder Abd R 4, Shoulder Mid Trap 4- 11/04/23: Shoulder Abd R/L 4+/4+, Mid Trap R/L 4/4+ Goal status: NOT MET     PLAN:  PT FREQUENCY: 1-2x/week  PT DURATION: 10 weeks  PLANNED INTERVENTIONS: Therapeutic exercises, Therapeutic activity, Neuromuscular re-education, Gait training, Patient/Family education, Self Care, Joint mobilization, Joint manipulation, Aquatic Therapy, Dry Needling, Electrical stimulation, Spinal manipulation, Spinal mobilization, Cryotherapy, Moist heat, Manual therapy, and Re-evaluation  PLAN FOR NEXT SESSION: Discharge from PT.   Ellin Goodie, PT, DPT  [facility] [address], [place], [address] Phone: [phone]   Fax:  [phone]

## 2023-11-22 ENCOUNTER — Ambulatory Visit: Payer: No Typology Code available for payment source | Admitting: Physical Therapy

## 2024-03-19 ENCOUNTER — Encounter: Payer: Self-pay | Admitting: Family

## 2024-03-19 ENCOUNTER — Other Ambulatory Visit: Payer: Self-pay | Admitting: Family

## 2024-03-19 DIAGNOSIS — E118 Type 2 diabetes mellitus with unspecified complications: Secondary | ICD-10-CM

## 2024-03-20 ENCOUNTER — Telehealth: Payer: Self-pay

## 2024-03-20 ENCOUNTER — Other Ambulatory Visit (HOSPITAL_COMMUNITY): Payer: Self-pay

## 2024-03-20 NOTE — Telephone Encounter (Signed)
 Pharmacy Patient Advocate Encounter   Received notification from Patient Advice Request messages that prior authorization for Trulicity  4.5MG /0.5ML auto-injectors is required/requested.   Insurance verification completed.   The patient is insured through [facility] .   Per test claim: PA required; PA submitted to above mentioned insurance via CoverMyMeds Key/confirmation #/EOC [id number] Status is pending   *Communicated with clinic in original message

## 2024-03-20 NOTE — Telephone Encounter (Signed)
 PA request has been Submitted. New Encounter has been or will be created for follow up. For additional info see Pharmacy Prior Auth telephone encounter from 03/20/2024.

## 2024-03-23 ENCOUNTER — Other Ambulatory Visit (HOSPITAL_COMMUNITY): Payer: Self-pay

## 2024-03-23 NOTE — Telephone Encounter (Signed)
 Pharmacy Patient Advocate Encounter  Received notification from [facility] that Prior Authorization for Trulicity  4.5MG /0.5ML auto-injectors has been APPROVED from 03/22/2024 to 03/22/2025. Unable to obtain price due to refill too soon rejection, last fill date 03/22/2024 next available fill date06/25/2025.   PA #/Case ID/Reference #: [id number]

## 2024-04-06 ENCOUNTER — Other Ambulatory Visit: Payer: Self-pay | Admitting: Family

## 2024-04-06 DIAGNOSIS — E118 Type 2 diabetes mellitus with unspecified complications: Secondary | ICD-10-CM

## 2024-04-06 NOTE — Telephone Encounter (Signed)
 lvm for pt to call office to schedule appt for f/u

## 2024-04-06 NOTE — Telephone Encounter (Signed)
 Lvmtcb. Sent mychart message

## 2024-04-17 ENCOUNTER — Ambulatory Visit: Payer: Self-pay | Admitting: Family

## 2024-04-17 ENCOUNTER — Ambulatory Visit (INDEPENDENT_AMBULATORY_CARE_PROVIDER_SITE_OTHER): Admitting: Family

## 2024-04-17 ENCOUNTER — Encounter: Payer: Self-pay | Admitting: Family

## 2024-04-17 VITALS — BP 110/62 | HR 98 | Temp 98.6°F | Ht 63.75 in | Wt 142.0 lb

## 2024-04-17 DIAGNOSIS — E1122 Type 2 diabetes mellitus with diabetic chronic kidney disease: Secondary | ICD-10-CM | POA: Diagnosis not present

## 2024-04-17 DIAGNOSIS — Z7985 Long-term (current) use of injectable non-insulin antidiabetic drugs: Secondary | ICD-10-CM | POA: Diagnosis not present

## 2024-04-17 DIAGNOSIS — N1831 Chronic kidney disease, stage 3a: Secondary | ICD-10-CM | POA: Diagnosis not present

## 2024-04-17 DIAGNOSIS — R7989 Other specified abnormal findings of blood chemistry: Secondary | ICD-10-CM

## 2024-04-17 DIAGNOSIS — Z7984 Long term (current) use of oral hypoglycemic drugs: Secondary | ICD-10-CM

## 2024-04-17 DIAGNOSIS — E118 Type 2 diabetes mellitus with unspecified complications: Secondary | ICD-10-CM

## 2024-04-17 DIAGNOSIS — R801 Persistent proteinuria, unspecified: Secondary | ICD-10-CM | POA: Insufficient documentation

## 2024-04-17 LAB — CBC WITH DIFFERENTIAL/PLATELET
Basophils Absolute: 0.1 10*3/uL (ref 0.0–0.1)
Basophils Relative: 1.2 % (ref 0.0–3.0)
Eosinophils Absolute: 0.4 10*3/uL (ref 0.0–0.7)
Eosinophils Relative: 7.7 % — ABNORMAL HIGH (ref 0.0–5.0)
HCT: 36.1 % (ref 36.0–46.0)
Hemoglobin: 12.3 g/dL (ref 12.0–15.0)
Lymphocytes Relative: 35.2 % (ref 12.0–46.0)
Lymphs Abs: 1.7 10*3/uL (ref 0.7–4.0)
MCHC: 33.9 g/dL (ref 30.0–36.0)
MCV: 92.1 fl (ref 78.0–100.0)
Monocytes Absolute: 0.3 10*3/uL (ref 0.1–1.0)
Monocytes Relative: 6.1 % (ref 3.0–12.0)
Neutro Abs: 2.5 10*3/uL (ref 1.4–7.7)
Neutrophils Relative %: 49.8 % (ref 43.0–77.0)
Platelets: 224 10*3/uL (ref 150.0–400.0)
RBC: 3.92 Mil/uL (ref 3.87–5.11)
RDW: 12.9 % (ref 11.5–15.5)
WBC: 5 10*3/uL (ref 4.0–10.5)

## 2024-04-17 LAB — COMPREHENSIVE METABOLIC PANEL WITH GFR
ALT: 64 U/L — ABNORMAL HIGH (ref 0–35)
AST: 64 U/L — ABNORMAL HIGH (ref 0–37)
Albumin: 4.2 g/dL (ref 3.5–5.2)
Alkaline Phosphatase: 81 U/L (ref 39–117)
BUN: 39 mg/dL — ABNORMAL HIGH (ref 6–23)
CO2: 27 meq/L (ref 19–32)
Calcium: 9.6 mg/dL (ref 8.4–10.5)
Chloride: 102 meq/L (ref 96–112)
Creatinine, Ser: 1.69 mg/dL — ABNORMAL HIGH (ref 0.40–1.20)
GFR: 37.3 mL/min — ABNORMAL LOW (ref >60.00)
Glucose, Bld: 185 mg/dL — ABNORMAL HIGH (ref 70–99)
Potassium: 4.4 meq/L (ref 3.5–5.1)
Sodium: 137 meq/L (ref 135–145)
Total Bilirubin: 0.4 mg/dL (ref 0.2–1.2)
Total Protein: 6.9 g/dL (ref 6.0–8.3)

## 2024-04-17 LAB — MICROALBUMIN / CREATININE URINE RATIO
Creatinine,U: 27.5 mg/dL
Microalb Creat Ratio: UNDETERMINED mg/g (ref 0.0–30.0)
Microalb, Ur: <0.7 mg/dL

## 2024-04-17 LAB — HEMOGLOBIN A1C: Hgb A1c MFr Bld: 6.8 % — ABNORMAL HIGH (ref 4.6–6.5)

## 2024-04-17 MED ORDER — METFORMIN HCL 1000 MG PO TABS: 1000.0000 mg | ORAL_TABLET | Freq: Two times a day (BID) | ORAL | 2 refills | Status: AC

## 2024-04-17 NOTE — Assessment & Plan Note (Addendum)
 Continue trulicity  4.5 mg weekly, metformin  1000 mg twice daily and jardiance  25 mg once daily. Ordered hga1c today pending results. Work on diabetic diet and exercise as tolerated. Yearly foot exam, and annual eye exam.

## 2024-04-17 NOTE — Assessment & Plan Note (Signed)
 Cmp today pending results.  Pt to f/u with nephrology as scheduled.

## 2024-04-17 NOTE — Progress Notes (Signed)
 Established Patient Office Visit  Subjective:   Patient ID: Kayla Erickson, female    DOB: 1981/06/04  Age: 43 y.o. MRN: [id number]  CC:  Chief Complaint  Patient presents with   Medical Management of Chronic Issues    D/m follow up     HPI: Kayla Erickson is a 43 y.o. female presenting on 04/17/2024 for Medical Management of Chronic Issues (D/m follow up )  DM2 with CKD: overdue for neprhology appt. Overall GFR stable. On trulicity  4.5 mg weekly as well as jardiance  25 mg and metformin . She is starting to gain some weight back which is good.     Wt Readings from Last 3 Encounters:  04/17/24 142 lb (64.4 kg)  09/30/23 135 lb (61.2 kg)  05/04/23 129 lb 6 oz (58.7 kg)         ROS: Negative unless specifically indicated above in HPI.   Relevant past medical history reviewed and updated as indicated.   Allergies and medications reviewed and updated.   Current Outpatient Medications:    Azelastine  HCl 137 MCG/SPRAY SOLN, PLACE 1 SPRAY INTO BOTH NOSTRILS 2 (TWO) TIMES DAILY. USE IN EACH NOSTRIL AS DIRECTED, Disp: 30 mL, Rfl: 12   Dulaglutide  (TRULICITY ) 4.5 MG/0.5ML SOAJ, INJECT 4.5 MG AS DIRECTED ONCE A WEEK., Disp: 2 mL, Rfl: 2   fluticasone  (FLONASE ) 50 MCG/ACT nasal spray, Place 2 sprays into both nostrils daily., Disp: 16 g, Rfl: 6   JARDIANCE  25 MG TABS tablet, TAKE 1 TABLET BY MOUTH DAILY BEFORE BREAKFAST., Disp: 90 tablet, Rfl: 1   lisinopril  (ZESTRIL ) 10 MG tablet, TAKE 1 TABLET BY MOUTH EVERY DAY, Disp: 90 tablet, Rfl: 3   metFORMIN  (GLUCOPHAGE ) 1000 MG tablet, Take 1 tablet (1,000 mg total) by mouth 2 (two) times daily with a meal., Disp: 180 tablet, Rfl: 2  Allergies  Allergen Reactions   Strattera  [Atomoxetine ] Other (See Comments)    Weight loss   Victoza  [Liraglutide ] Nausea Only    Objective:   BP 110/62   Pulse 98   Temp 98.6 F (37 C) (Oral)   Ht 5' 3.75 (1.619 m)   Wt 142 lb (64.4 kg)   SpO2 95%   BMI 24.57 kg/m    Physical  Exam Constitutional:      General: She is not in acute distress.    Appearance: Normal appearance. She is normal weight. She is not ill-appearing, toxic-appearing or diaphoretic.  HENT:     Head: Normocephalic.   Cardiovascular:     Rate and Rhythm: Normal rate and regular rhythm.  Pulmonary:     Effort: Pulmonary effort is normal.     Breath sounds: Normal breath sounds.   Musculoskeletal:        General: Normal range of motion.   Neurological:     General: No focal deficit present.     Mental Status: She is alert and oriented to person, place, and time. Mental status is at baseline.   Psychiatric:        Mood and Affect: Mood normal.        Behavior: Behavior normal.        Thought Content: Thought content normal.        Judgment: Judgment normal.    Title   Diabetic Foot Exam - detailed Is there a history of foot ulcer?: No Is there a foot ulcer now?: No Is there swelling?: No Is there elevated skin temperature?: No Is there abnormal foot shape?: No Is there a claw toe deformity?:  No Are the toenails long?: No Are the toenails thick?: No Are the toenails ingrown?: No Is the skin thin, fragile, shiny and hairless?: No Normal Range of Motion?: Yes Is there foot or ankle muscle weakness?: No Do you have pain in calf while walking?: No Are the shoes appropriate in style and fit?: Yes Can the patient see the bottom of their feet?: Yes Pulse Foot Exam completed.: Yes   Right Posterior Tibialis: Present Left posterior Tibialis: Present   Right Dorsalis Pedis: Present Left Dorsalis Pedis: Present     Semmes-Weinstein Monofilament Test + means has sensation and - means no sensation  R Foot Test Control: Pos L Foot Test Control: Pos   R Site 1-Great Toe: Pos L Site 1-Great Toe: Pos   R Site 4: Pos L Site 4: Pos   R site 5: Pos L Site 5: Pos  R Site 6: Pos L Site 6: Pos     Image components are not supported.   Image components are not supported. Image  components are not supported.  [place] Comments     Assessment & Plan:  Chronic kidney disease, stage 3a (HCC) Assessment & Plan: Cmp today pending results.  Pt to f/u with nephrology as scheduled.   Orders: -     Parathyroid hormone, intact (no Ca) -     CBC with Differential/Platelet  Diabetes mellitus type 2 with complications (HCC) -     Microalbumin / creatinine urine ratio -     metFORMIN  HCl; Take 1 tablet (1,000 mg total) by mouth 2 (two) times daily with a meal.  Dispense: 180 tablet; Refill: 2 -     Comprehensive metabolic panel with GFR -     Hemoglobin A1c  Persistent proteinuria  Type 2 diabetes mellitus with stage 3a chronic kidney disease, without long-term current use of insulin  (HCC) Assessment & Plan: Continue trulicity  4.5 mg weekly, metformin  1000 mg twice daily and jardiance  25 mg once daily. Ordered hga1c today pending results. Work on diabetic diet and exercise as tolerated. Yearly foot exam, and annual eye exam.        Follow up plan: Return in about 6 months (around 10/17/2024) for f/u CPE.  Ginger Patrick, FNP

## 2024-04-24 ENCOUNTER — Other Ambulatory Visit: Payer: Self-pay | Admitting: Family

## 2024-04-24 DIAGNOSIS — R7989 Other specified abnormal findings of blood chemistry: Secondary | ICD-10-CM

## 2024-04-24 NOTE — Progress Notes (Signed)
 Celiac

## 2024-05-08 ENCOUNTER — Other Ambulatory Visit (INDEPENDENT_AMBULATORY_CARE_PROVIDER_SITE_OTHER)

## 2024-05-08 DIAGNOSIS — R7989 Other specified abnormal findings of blood chemistry: Secondary | ICD-10-CM | POA: Diagnosis not present

## 2024-05-10 ENCOUNTER — Ambulatory Visit: Payer: Self-pay | Admitting: Family

## 2024-05-10 LAB — CELIAC DISEASE PANEL
(tTG) Ab, IgA: <1 U/mL
(tTG) Ab, IgG: <1 U/mL
Gliadin IgA: <1 U/mL
Gliadin IgG: <1 U/mL
Immunoglobulin A: 182 mg/dL (ref 47–310)

## 2024-05-10 LAB — HEPATITIS PANEL, ACUTE
Hep A IgM: NONREACTIVE
Hep B C IgM: NONREACTIVE
Hepatitis B Surface Ag: NONREACTIVE
Hepatitis C Ab: NONREACTIVE

## 2024-06-08 ENCOUNTER — Other Ambulatory Visit: Payer: Self-pay | Admitting: Family

## 2024-06-08 DIAGNOSIS — E118 Type 2 diabetes mellitus with unspecified complications: Secondary | ICD-10-CM

## 2024-07-12 ENCOUNTER — Telehealth: Admitting: Physician Assistant

## 2024-07-12 DIAGNOSIS — H00015 Hordeolum externum left lower eyelid: Secondary | ICD-10-CM | POA: Diagnosis not present

## 2024-07-12 MED ORDER — ERYTHROMYCIN 5 MG/GM OP OINT: 1.0000 | TOPICAL_OINTMENT | Freq: Every day | OPHTHALMIC | 0 refills | Status: AC

## 2024-07-12 NOTE — Progress Notes (Signed)
  E-Visit for Stye   We are sorry that you are not feeling well. Here is how we plan to help!  Based on what you have shared with me it looks like you have a stye.  A stye is an inflammation of the eyelid.  It is often a red, painful lump near the edge of the eyelid that may look like a boil or a pimple.  A stye develops when an infection occurs at the base of an eyelash.   We have made appropriate suggestions for you based upon your presentation: Simple styes can be treated without medical intervention.  Most styes either resolve spontaneously or resolve with simple home treatment by applying warm compresses or heated washcloth to the stye for about 10-15 minutes three to four times a day. This causes the stye to drain and resolve. and I have prescribed Erythromycin Ophthalmic ointment 0.5% Apply topically in affected eye daily at bedtime for 5 days. To apply: Tilt the head back and, pressing your finger gently on the skin just beneath the lower eyelid, pull the lower eyelid away from the eye to make a space. Squeeze a thin strip of ointment into this space. A 1-cm (approximately 1/3-inch) strip of ointment is usually enough, unless you have been told by your doctor to use a different amount. Let go of the eyelid and gently close the eyes. Keep the eyes closed for 1 or 2 minutes to allow the medicine to come into contact with the infection.      HOME CARE:  Wash your hands often! Let the stye open on its own. Don't squeeze or open it. Don't rub your eyes. This can irritate your eyes and let in bacteria.  If you need to touch your eyes, wash your hands first. Don't wear eye makeup or contact lenses until the area has healed.  GET HELP RIGHT AWAY IF:  Your symptoms do not improve. You develop blurred or loss of vision. Your symptoms worsen (increased discharge, pain or redness).   Thank you for choosing an e-visit.  Your e-visit answers were reviewed by a board certified advanced clinical  practitioner to complete your personal care plan. Depending upon the condition, your plan could have included both over the counter or prescription medications.  Please review your pharmacy choice. Make sure the pharmacy is open so you can pick up prescription now. If there is a problem, you may contact your provider through [employer] and have the prescription routed to another pharmacy.  Your safety is important to us . If you have drug allergies check your prescription carefully.   For the next 24 hours you can use MyChart to ask questions about today's visit, request a non-urgent call back, or ask for a work or school excuse. You will get an email in the next two days asking about your experience. I hope that your e-visit has been valuable and will speed your recovery.    I have spent 5 minutes in review of e-visit questionnaire, review and updating patient chart, medical decision making and response to patient.   Delon CHRISTELLA Dickinson, PA-C

## 2024-08-22 ENCOUNTER — Other Ambulatory Visit: Payer: Self-pay | Admitting: Family

## 2024-08-22 DIAGNOSIS — I10 Essential (primary) hypertension: Secondary | ICD-10-CM

## 2024-09-02 ENCOUNTER — Telehealth: Admitting: Family Medicine

## 2024-09-02 DIAGNOSIS — J069 Acute upper respiratory infection, unspecified: Secondary | ICD-10-CM

## 2024-09-02 MED ORDER — PREDNISONE 10 MG (21) PO TBPK: ORAL_TABLET | ORAL | 0 refills | Status: AC

## 2024-09-02 MED ORDER — BENZONATATE 100 MG PO CAPS: 100.0000 mg | ORAL_CAPSULE | Freq: Three times a day (TID) | ORAL | 0 refills | Status: AC | PRN

## 2024-09-02 MED ORDER — FLUTICASONE PROPIONATE 50 MCG/ACT NA SUSP: 2.0000 | Freq: Every day | NASAL | 0 refills | Status: AC

## 2024-09-02 NOTE — Progress Notes (Signed)
 Virtual Visit Consent   [name], you are scheduled for a virtual visit with a [facility] provider today. Just as with appointments in the office, your consent must be obtained to participate. Your consent will be active for this visit and any virtual visit you may have with one of our providers in the next 365 days. If you have a MyChart account, a copy of this consent can be sent to you electronically.  As this is a virtual visit, video technology does not allow for your provider to perform a traditional examination. This may limit your provider's ability to fully assess your condition. If your provider identifies any concerns that need to be evaluated in person or the need to arrange testing (such as labs, EKG, etc.), we will make arrangements to do so. Although advances in technology are sophisticated, we cannot ensure that it will always work on either your end or our end. If the connection with a video visit is poor, the visit may have to be switched to a telephone visit. With either a video or telephone visit, we are not always able to ensure that we have a secure connection.  By engaging in this virtual visit, you consent to the provision of healthcare and authorize for your insurance to be billed (if applicable) for the services provided during this visit. Depending on your insurance coverage, you may receive a charge related to this service.  I need to obtain your verbal consent now. Are you willing to proceed with your visit today? [name] has provided verbal consent on 09/02/2024 for a virtual visit (video or telephone). Kayla Erickson, [place]  Date: 09/02/2024 2:30 PM   Virtual Visit via Video Note   I, Kayla Erickson, connected with  [name]  ([id number], [birth year]) on 09/02/24 at  2:30 PM EST by a video-enabled telemedicine application and verified that I am speaking with the correct person using two identifiers.  Location: Patient: Virtual Visit Location Patient:  Home Provider: Virtual Visit Location Provider: Home Office   I discussed the limitations of evaluation and management by telemedicine and the availability of in person appointments. The patient expressed understanding and agreed to proceed.    History of Present Illness: Kayla Erickson is a 43 y.o. who identifies as a female who was assigned female at birth, and is being seen today for c/o chest congestion, drainage and cough.  Pt states symptoms started three days ago.  Pt states taking Robitussin but at night wakes up coughing and cannot sleep. Pt denies fever or chills and does not report concern for pregnancy.   HPI: HPI  Problems:  Patient Active Problem List   Diagnosis Date Noted   Chronic kidney disease, stage 3a (HCC) 04/17/2024   Persistent proteinuria 04/17/2024   Type 2 diabetes mellitus with stage 3a chronic kidney disease, without long-term current use of insulin  (HCC) 04/17/2024   Bilateral epiphora 09/30/2023   Chronic pain in right shoulder 05/04/2023   Seasonal allergic rhinitis due to pollen 06/15/2022   Elevated liver function tests 04/28/2022   Hypomagnesemia 04/28/2022   Attention deficit hyperactivity disorder (ADHD), predominantly inattentive type 04/28/2022   Diabetic retinopathy (HCC) 03/28/2013   Essential hypertension 03/25/2010    Allergies:  Allergies  Allergen Reactions   Strattera  [Atomoxetine ] Other (See Comments)    Weight loss   Victoza  [Liraglutide ] Nausea Only   Medications:  Current Outpatient Medications:    benzonatate  (TESSALON ) 100 MG capsule, Take 1 capsule (100 mg total) by mouth 3 (three)  times daily as needed for up to 7 days., Disp: 21 capsule, Rfl: 0   fluticasone  (FLONASE ) 50 MCG/ACT nasal spray, Place 2 sprays into both nostrils daily., Disp: 16 g, Rfl: 0   predniSONE (STERAPRED UNI-PAK 21 TAB) 10 MG (21) TBPK tablet, Take following package directions., Disp: 21 tablet, Rfl: 0   Dulaglutide  (TRULICITY ) 4.5 MG/0.5ML SOAJ, INJECT 4.5  MG AS DIRECTED ONCE A WEEK., Disp: 6 mL, Rfl: 1   JARDIANCE  25 MG TABS tablet, TAKE 1 TABLET BY MOUTH DAILY BEFORE BREAKFAST., Disp: 90 tablet, Rfl: 1   lisinopril  (ZESTRIL ) 10 MG tablet, TAKE 1 TABLET BY MOUTH EVERY DAY, Disp: 90 tablet, Rfl: 3   metFORMIN  (GLUCOPHAGE ) 1000 MG tablet, Take 1 tablet (1,000 mg total) by mouth 2 (two) times daily with a meal., Disp: 180 tablet, Rfl: 2  Observations/Objective: Patient is well-developed, well-nourished in no acute distress.  Resting comfortably at home.  Head is normocephalic, atraumatic.  No labored breathing. Speech is clear and coherent with logical content.  Patient is alert and oriented at baseline.    Assessment and Plan: 1. Upper respiratory tract infection, unspecified type (Primary) - fluticasone  (FLONASE ) 50 MCG/ACT nasal spray; Place 2 sprays into both nostrils daily.  Dispense: 16 g; Refill: 0 - predniSONE (STERAPRED UNI-PAK 21 TAB) 10 MG (21) TBPK tablet; Take following package directions.  Dispense: 21 tablet; Refill: 0 - benzonatate  (TESSALON ) 100 MG capsule; Take 1 capsule (100 mg total) by mouth 3 (three) times daily as needed for up to 7 days.  Dispense: 21 capsule; Refill: 0  -Start Flonase , Tessalon  and prednisone taper -Pt advised to follow up with PCP or in person urgent care for worsening symptoms   Follow Up Instructions: I discussed the assessment and treatment plan with the patient. The patient was provided an opportunity to ask questions and all were answered. The patient agreed with the plan and demonstrated an understanding of the instructions.  A copy of instructions were sent to the patient via MyChart unless otherwise noted below.    The patient was advised to call back or seek an in-person evaluation if the symptoms worsen or if the condition fails to improve as anticipated.    Kayla Mater, PA-C

## 2024-09-02 NOTE — Patient Instructions (Signed)
 Kayla Erickson, thank you for joining Roosvelt Mater, PA-C for today's virtual visit.  While this provider is not your primary care provider (PCP), if your PCP is located in our provider database this encounter information will be shared with them immediately following your visit.   A [facility] MyChart account gives you access to today's visit and all your visits, tests, and labs performed at [facility]  click here if you don't have a [facility] MyChart account or go to [email or web address]  Consent: (Patient) Kayla Erickson provided verbal consent for this virtual visit at the beginning of the encounter.  Current Medications:  Current Outpatient Medications:    benzonatate  (TESSALON ) 100 MG capsule, Take 1 capsule (100 mg total) by mouth 3 (three) times daily as needed for up to 7 days., Disp: 21 capsule, Rfl: 0   fluticasone  (FLONASE ) 50 MCG/ACT nasal spray, Place 2 sprays into both nostrils daily., Disp: 16 g, Rfl: 0   predniSONE (STERAPRED UNI-PAK 21 TAB) 10 MG (21) TBPK tablet, Take following package directions., Disp: 21 tablet, Rfl: 0   Dulaglutide  (TRULICITY ) 4.5 MG/0.5ML SOAJ, INJECT 4.5 MG AS DIRECTED ONCE A WEEK., Disp: 6 mL, Rfl: 1   JARDIANCE  25 MG TABS tablet, TAKE 1 TABLET BY MOUTH DAILY BEFORE BREAKFAST., Disp: 90 tablet, Rfl: 1   lisinopril  (ZESTRIL ) 10 MG tablet, TAKE 1 TABLET BY MOUTH EVERY DAY, Disp: 90 tablet, Rfl: 3   metFORMIN  (GLUCOPHAGE ) 1000 MG tablet, Take 1 tablet (1,000 mg total) by mouth 2 (two) times daily with a meal., Disp: 180 tablet, Rfl: 2   Medications ordered in this encounter:  Meds ordered this encounter  Medications   fluticasone  (FLONASE ) 50 MCG/ACT nasal spray    Sig: Place 2 sprays into both nostrils daily.    Dispense:  16 g    Refill:  0   predniSONE (STERAPRED UNI-PAK 21 TAB) 10 MG (21) TBPK tablet    Sig: Take following package directions.    Dispense:  21 tablet    Refill:  0    Please dispense one standard blister pack  taper.   benzonatate  (TESSALON ) 100 MG capsule    Sig: Take 1 capsule (100 mg total) by mouth 3 (three) times daily as needed for up to 7 days.    Dispense:  21 capsule    Refill:  0     *If you need refills on other medications prior to your next appointment, please contact your pharmacy*  Follow-Up: Call back or seek an in-person evaluation if the symptoms worsen or if the condition fails to improve as anticipated.   Virtual Care [phone]  Other Instructions Upper Respiratory Infection, Adult An upper respiratory infection (URI) is a common viral infection of the nose, throat, and upper air passages that lead to the lungs. The most common type of URI is the common cold. URIs usually get better on their own, without medical treatment. What are the causes? A URI is caused by a virus. You may catch a virus by: Breathing in droplets from an infected person's cough or sneeze. Touching something that has been exposed to the virus (is contaminated) and then touching your mouth, nose, or eyes. What increases the risk? You are more likely to get a URI if: You are very young or very old. You have close contact with others, such as at work, school, or a health care facility. You smoke. You have long-term (chronic) heart or lung disease. You have a weakened disease-fighting system (immune  system). You have nasal allergies or asthma. You are experiencing a lot of stress. You have poor nutrition. What are the signs or symptoms? A URI usually involves some of the following symptoms: Runny or stuffy (congested) nose. Cough. Sneezing. Sore throat. Headache. Fatigue. Fever. Loss of appetite. Pain in your forehead, behind your eyes, and over your cheekbones (sinus pain). Muscle aches. Redness or irritation of the eyes. Pressure in the ears or face. How is this diagnosed? This condition may be diagnosed based on your medical history and symptoms, and a physical exam. Your  health care provider may use a swab to take a mucus sample from your nose (nasal swab). This sample can be tested to determine what virus is causing the illness. How is this treated? URIs usually get better on their own within 7-10 days. Medicines cannot cure URIs, but your health care provider may recommend certain medicines to help relieve symptoms, such as: Over-the-counter cold medicines. Cough suppressants. Coughing is a type of defense against infection that helps to clear the respiratory system, so take these medicines only as recommended by your health care provider. Fever-reducing medicines. Follow these instructions at home: Activity Rest as needed. If you have a fever, stay home from work or school until your fever is gone or until your health care provider says your URI cannot spread to other people (is no longer contagious). Your health care provider may have you wear a face mask to prevent your infection from spreading. Relieving symptoms Gargle with a mixture of salt and water 3-4 times a day or as needed. To make salt water, completely dissolve -1 tsp (3-6 g) of salt in 1 cup (237 mL) of warm water. Use a cool-mist humidifier to add moisture to the air. This can help you breathe more easily. Eating and drinking  Drink enough fluid to keep your urine pale yellow. Eat soups and other clear broths. General instructions  Take over-the-counter and prescription medicines only as told by your health care provider. These include cold medicines, fever reducers, and cough suppressants. Do not use any products that contain nicotine or tobacco. These products include cigarettes, chewing tobacco, and vaping devices, such as e-cigarettes. If you need help quitting, ask your health care provider. Stay away from secondhand smoke. Stay up to date on all immunizations, including the yearly (annual) flu vaccine. Keep all follow-up visits. This is important. How to prevent the spread of  infection to others URIs can be contagious. To prevent the infection from spreading: Wash your hands with soap and water for at least 20 seconds. If soap and water are not available, use hand sanitizer. Avoid touching your mouth, face, eyes, or nose. Cough or sneeze into a tissue or your sleeve or elbow instead of into your hand or into the air.  Contact a health care provider if: You are getting worse instead of better. You have a fever or chills. Your mucus is brown or red. You have yellow or brown discharge coming from your nose. You have pain in your face, especially when you bend forward. You have swollen neck glands. You have pain while swallowing. You have white areas in the back of your throat. Get help right away if: You have shortness of breath that gets worse. You have severe or persistent: Headache. Ear pain. Sinus pain. Chest pain. You have chronic lung disease along with any of the following: Making high-pitched whistling sounds when you breathe, most often when you breathe out (wheezing). Prolonged cough (more than  14 days). Coughing up blood. A change in your usual mucus. You have a stiff neck. You have changes in your: Vision. Hearing. Thinking. Mood. These symptoms may be an emergency. Get help right away. Call 911. Do not wait to see if the symptoms will go away. Do not drive yourself to the hospital. Summary An upper respiratory infection (URI) is a common infection of the nose, throat, and upper air passages that lead to the lungs. A URI is caused by a virus. URIs usually get better on their own within 7-10 days. Medicines cannot cure URIs, but your health care provider may recommend certain medicines to help relieve symptoms. This information is not intended to replace advice given to you by your health care provider. Make sure you discuss any questions you have with your health care provider. Document Revised: 05/07/2021 Document Reviewed:  05/07/2021 Elsevier Patient Education  2024 Elsevier Inc.   If you have been instructed to have an in-person evaluation today at a local Urgent Care facility, please use the link below. It will take you to a list of all of our available [facility], including address, phone number and hours of operation. Please do not delay care.  [facility]  If you or a family member do not have a primary care provider, use the link below to schedule a visit and establish care. When you choose a [facility] primary care physician or advanced practice provider, you gain a long-term partner in health. Find a Primary Care Provider  Learn more about [facility]'s in-office and virtual care options: [facility] - Get Care Now

## 2024-09-05 ENCOUNTER — Ambulatory Visit: Payer: Self-pay

## 2024-09-05 NOTE — Telephone Encounter (Signed)
 FYI Only or Action Required?: Action required by provider: clinical question for provider and update on patient condition.  Patient was last seen in primary care on 09/02/2024 by Kingston Robes, PA-C.  Called Nurse Triage reporting Cough.  Symptoms began a week ago.  Symptoms are: unchanged.  Triage Disposition: Home Care  Patient/caregiver understands and will follow disposition?: Yes      Copied from CRM [id number]. Topic: Clinical - Red Word Triage >> Sep 05, 2024 11:54 AM Frederich PARAS wrote: [facility] that prompted transfer to Nurse Triage: productive cough  bad cough,couphing up mucus, runny nose, no fever,  she got a sterioid but it raised blood sugar  to about 400 on 11/17        Reason for Disposition  Cough with cold symptoms (e.g., runny nose, postnasal drip, throat clearing)  Answer Assessment - Initial Assessment Questions Patient had a telehealth appointment 11/15 and was prescribed a steroid but states that she stopped taking it because it raised her blood sugar. Patient would like to know if she could get an antibiotic prescribed or to see if there are other treatment option. Please advise.      1. ONSET: When did the cough begin?      About a week ago 2. SEVERITY: How bad is the cough today?      Moderate  3. SPUTUM: Describe the color of your sputum (e.g., none, dry cough; clear, white, yellow, green)     Green 4. HEMOPTYSIS: Are you coughing up any blood? If Yes, ask: How much? (e.g., flecks, streaks, tablespoons, etc.)     No 5. DIFFICULTY BREATHING: Are you having difficulty breathing? If Yes, ask: How bad is it? (e.g., mild, moderate, severe)      No 6. FEVER: Do you have a fever? If Yes, ask: What is your temperature, how was it measured, and when did it start?     No 7. CARDIAC HISTORY: Do you have any history of heart disease? (e.g., heart attack, congestive heart failure)      No 8. LUNG HISTORY: Do you have any history of lung  disease?  (e.g., pulmonary embolus, asthma, emphysema)     No 9. PE RISK FACTORS: Do you have a history of blood clots? (or: recent major surgery, recent prolonged travel, bedridden)     No 10. OTHER SYMPTOMS: Do you have any other symptoms? (e.g., runny nose, wheezing, chest pain)       Runny nose  Protocols used: Cough - Acute Productive-A-AH

## 2024-09-29 ENCOUNTER — Other Ambulatory Visit: Payer: Self-pay | Admitting: Family

## 2024-09-29 DIAGNOSIS — E118 Type 2 diabetes mellitus with unspecified complications: Secondary | ICD-10-CM

## 2024-10-05 LAB — PARATHYROID HORMONE, INTACT (NO CA): PTH: 64 pg/mL (ref 16–77)

## 2024-11-06 ENCOUNTER — Encounter: Payer: Self-pay | Admitting: Family

## 2024-11-06 ENCOUNTER — Other Ambulatory Visit (HOSPITAL_COMMUNITY): Payer: Self-pay

## 2024-11-06 ENCOUNTER — Telehealth: Payer: Self-pay

## 2024-11-06 NOTE — Telephone Encounter (Signed)
 Pharmacy Patient Advocate Encounter   Received notification from Physician's Office that prior authorization for Trulicity  4.5MG /0.5ML auto-injectors  is required/requested.   Insurance verification completed.   The patient is insured through [employer].   Per test claim: PA required; PA submitted to above mentioned insurance via Latent Key/confirmation #/EOC [facility] Status is pending

## 2024-11-15 ENCOUNTER — Other Ambulatory Visit (HOSPITAL_COMMUNITY): Payer: Self-pay

## 2024-11-15 NOTE — Telephone Encounter (Signed)
 Mychart sent to patient.

## 2024-11-15 NOTE — Telephone Encounter (Signed)
 Pharmacy Patient Advocate Encounter  Received notification from [facility] that Prior Authorization for Trulicity  4.5MG /0.5ML auto-injectors has been APPROVED from 11/15/2024 to 11/15/2025. Ran test claim, Copay is $25. This test claim was processed through [facility]- copay amounts may vary at other pharmacies due to pharmacy/plan contracts, or as the patient moves through the different stages of their insurance plan.   PA #/Case ID/Reference #: [id number]
